# Patient Record
Sex: Female | Born: 1965 | Race: White | Hispanic: No | Marital: Married | Smoking: Never smoker
Health system: Southern US, Community
[De-identification: ages and names within clinical notes are randomized; demographics above are authoritative.]

## PROBLEM LIST (undated history)

## (undated) DIAGNOSIS — E079 Disorder of thyroid, unspecified: Secondary | ICD-10-CM

## (undated) DIAGNOSIS — R5382 Chronic fatigue, unspecified: Secondary | ICD-10-CM

## (undated) DIAGNOSIS — M1990 Unspecified osteoarthritis, unspecified site: Secondary | ICD-10-CM

## (undated) DIAGNOSIS — R51 Headache: Secondary | ICD-10-CM

## (undated) DIAGNOSIS — F329 Major depressive disorder, single episode, unspecified: Secondary | ICD-10-CM

## (undated) DIAGNOSIS — M797 Fibromyalgia: Secondary | ICD-10-CM

## (undated) DIAGNOSIS — F419 Anxiety disorder, unspecified: Secondary | ICD-10-CM

## (undated) DIAGNOSIS — G8929 Other chronic pain: Secondary | ICD-10-CM

## (undated) DIAGNOSIS — F32A Depression, unspecified: Secondary | ICD-10-CM

## (undated) DIAGNOSIS — R519 Headache, unspecified: Secondary | ICD-10-CM

## (undated) DIAGNOSIS — F319 Bipolar disorder, unspecified: Secondary | ICD-10-CM

## (undated) DIAGNOSIS — I10 Essential (primary) hypertension: Secondary | ICD-10-CM

## (undated) HISTORY — PX: BACK SURGERY: SHX140

## (undated) HISTORY — PX: KNEE SURGERY: SHX244

## (undated) HISTORY — DX: Headache: R51

## (undated) HISTORY — DX: Headache, unspecified: R51.9

## (undated) HISTORY — DX: Essential (primary) hypertension: I10

## (undated) HISTORY — PX: ANTERIOR CERVICAL DECOMP/DISCECTOMY FUSION: SHX1161

---

## 2016-08-30 ENCOUNTER — Emergency Department (HOSPITAL_COMMUNITY)
Admission: EM | Admit: 2016-08-30 | Discharge: 2016-08-30 | Disposition: A | Discharge status: Home / Self Care | Payer: Medicare Other | Attending: Emergency Medicine | Admitting: Emergency Medicine

## 2016-08-30 ENCOUNTER — Emergency Department (HOSPITAL_COMMUNITY): Payer: Medicare Other

## 2016-08-30 ENCOUNTER — Encounter (HOSPITAL_COMMUNITY): Payer: Self-pay

## 2016-08-30 DIAGNOSIS — E039 Hypothyroidism, unspecified: Secondary | ICD-10-CM | POA: Insufficient documentation

## 2016-08-30 DIAGNOSIS — Y939 Activity, unspecified: Secondary | ICD-10-CM | POA: Diagnosis not present

## 2016-08-30 DIAGNOSIS — Y999 Unspecified external cause status: Secondary | ICD-10-CM | POA: Insufficient documentation

## 2016-08-30 DIAGNOSIS — S199XXA Unspecified injury of neck, initial encounter: Secondary | ICD-10-CM | POA: Diagnosis present

## 2016-08-30 DIAGNOSIS — Y92410 Unspecified street and highway as the place of occurrence of the external cause: Secondary | ICD-10-CM | POA: Insufficient documentation

## 2016-08-30 DIAGNOSIS — S161XXA Strain of muscle, fascia and tendon at neck level, initial encounter: Secondary | ICD-10-CM | POA: Diagnosis not present

## 2016-08-30 HISTORY — DX: Disorder of thyroid, unspecified: E07.9

## 2016-08-30 HISTORY — DX: Unspecified osteoarthritis, unspecified site: M19.90

## 2016-08-30 HISTORY — DX: Bipolar disorder, unspecified: F31.9

## 2016-08-30 HISTORY — DX: Depression, unspecified: F32.A

## 2016-08-30 HISTORY — DX: Major depressive disorder, single episode, unspecified: F32.9

## 2016-08-30 HISTORY — DX: Anxiety disorder, unspecified: F41.9

## 2016-08-30 NOTE — ED Notes (Signed)
Patient transported to CT 
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
## 2016-08-30 NOTE — Discharge Instructions (Signed)
Ibuprofen 600 mg every 6 hours as needed for pain.  Apply heating pad as needed for comfort.  Follow-up with your surgeon if you're not improving in the next 3-4 days.
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
## 2016-09-10 ENCOUNTER — Ambulatory Visit (INDEPENDENT_AMBULATORY_CARE_PROVIDER_SITE_OTHER): Payer: Medicare Other | Admitting: Family Medicine

## 2016-09-10 ENCOUNTER — Encounter: Payer: Self-pay | Admitting: Family Medicine

## 2016-09-10 VITALS — BP 113/79 | HR 84 | Temp 98.6°F | Ht 66.0 in | Wt 172.0 lb

## 2016-09-10 DIAGNOSIS — L600 Ingrowing nail: Secondary | ICD-10-CM

## 2016-09-10 DIAGNOSIS — F32A Depression, unspecified: Secondary | ICD-10-CM | POA: Insufficient documentation

## 2016-09-10 DIAGNOSIS — E039 Hypothyroidism, unspecified: Secondary | ICD-10-CM | POA: Insufficient documentation

## 2016-09-10 DIAGNOSIS — F418 Other specified anxiety disorders: Secondary | ICD-10-CM

## 2016-09-10 DIAGNOSIS — F419 Anxiety disorder, unspecified: Secondary | ICD-10-CM

## 2016-09-10 DIAGNOSIS — M47812 Spondylosis without myelopathy or radiculopathy, cervical region: Secondary | ICD-10-CM | POA: Insufficient documentation

## 2016-09-10 DIAGNOSIS — F329 Major depressive disorder, single episode, unspecified: Secondary | ICD-10-CM | POA: Insufficient documentation

## 2016-09-10 MED ORDER — SULFAMETHOXAZOLE-TRIMETHOPRIM 800-160 MG PO TABS: 1.0000 | ORAL_TABLET | Freq: Two times a day (BID) | ORAL | 0 refills | Status: DC

## 2016-09-10 NOTE — Progress Notes (Signed)
BP 113/79   Pulse 84   Temp 98.6 F (37 C) (Oral)   Ht 5\' 6"  (1.676 m)   Wt 172 lb (78 kg)   LMP 08/28/2016 (Approximate) Comment: Pt reports being unknown when she had her last period, but had spotting two days ago.   BMI 27.76 kg/m    Subjective:    Patient ID: Wendy Collier, female    DOB: February 02, 1966, 51 y.o.   MRN: [id number]  HPI: Wendy Collier is a 51 y.o. female presenting on 09/10/2016 for Ingrown Toenail (right great toe, had bilateral toe nail removal in November)   HPI Right great toe pain and ingrown toenail Patient is having right great toe pain and an ingrown toenail on that right great toe that she has had recurrently and is starting to flare up again over the past week. She has had it removed surgically once before by dermatology but wants it not to grow back this time if possible. She's had it multiple times in the past in her life. She denies any fevers or chills. She does have some redness and warmth and some purulent drainage out of that side of the toe. I discussed the option of take it off today in the office but that we do not have the agent needed to keep it from growing back any further versus going to podiatry in having them do it and she opted to go to podiatry.  Anxiety and depression Patient is coming as a new patient to our office to establish care for anxiety and depression and is currently on medications for both anxiety and depression and ADHD. She is currently on Xanax and Adderall and Remeron and Lyrica and Trintellix. She is also on Ambien. She does not needing refills today but will come back and discuss options on her next visit. She denies any suicidal ideations or thoughts of hurting herself.  Relevant past medical, surgical, family and social history reviewed and updated as indicated. Interim medical history since our last visit reviewed. Allergies and medications reviewed and updated.  Review of Systems  Constitutional: Negative for chills and  fever.  Respiratory: Negative for chest tightness and shortness of breath.   Cardiovascular: Negative for chest pain and leg swelling.  Genitourinary: Negative for difficulty urinating and dysuria.  Musculoskeletal: Negative for back pain and gait problem.  Skin: Positive for color change and wound. Negative for rash.  Neurological: Negative for light-headedness and headaches.  Psychiatric/Behavioral: Negative for agitation and behavioral problems.  All other systems reviewed and are negative.   Per HPI unless specifically indicated above   Allergies as of 09/10/2016      Reactions   Tegaderm Ag Mesh [silver]       Medication List       Accurate as of 09/10/16  4:22 PM. Always use your most recent med list.          ALPRAZolam 0.5 MG tablet Commonly known as:  XANAX Take 0.5 mg by mouth 3 (three) times daily as needed for anxiety.   amphetamine-dextroamphetamine 10 MG tablet Commonly known as:  ADDERALL Take 10 mg by mouth 2 (two) times daily with a meal.   Biotin 1 MG Caps Take 1 mg by mouth daily.   celecoxib 200 MG capsule Commonly known as:  CELEBREX Take 200 mg by mouth daily.   cholecalciferol 1000 units tablet Commonly known as:  VITAMIN D Take 1,000 Units by mouth daily.   cyclobenzaprine 10 MG tablet Commonly known as:  FLEXERIL Take 10 mg by mouth 3 (three) times daily as needed for muscle spasms.   Fish Oil 1000 MG Caps Take 2,000 mg by mouth daily.   l-methylfolate-B6-B12 3-35-2 MG Tabs tablet Commonly known as:  METANX Take 1 tablet by mouth daily.   levothyroxine 50 MCG tablet Commonly known as:  SYNTHROID, LEVOTHROID Take 50 mcg by mouth daily before breakfast.   magnesium 30 MG tablet Take 30 mg by mouth 2 (two) times daily.   mirtazapine 45 MG tablet Commonly known as:  REMERON Take 45 mg by mouth at bedtime.   multivitamin with minerals tablet Take 1 tablet by mouth daily.   oxyCODONE-acetaminophen 5-325 MG tablet Commonly known  as:  PERCOCET/ROXICET Take 1 tablet by mouth every 6 (six) hours as needed for severe pain.   Pitavastatin Calcium 2 MG Tabs Take 2 mg by mouth at bedtime.   pregabalin 200 MG capsule Commonly known as:  LYRICA Take 200 mg by mouth daily.   selenium 50 MCG Tabs tablet Take 50 mcg by mouth daily.   sulfamethoxazole-trimethoprim 800-160 MG tablet Commonly known as:  BACTRIM DS,SEPTRA DS Take 1 tablet by mouth 2 (two) times daily.   TRINTELLIX 10 MG Tabs Generic drug:  vortioxetine HBr Take 10 mg by mouth daily.   vitamin C 100 MG tablet Take 100 mg by mouth daily.   zolpidem 10 MG tablet Commonly known as:  AMBIEN Take 10 mg by mouth at bedtime as needed for sleep.          Objective:    BP 113/79   Pulse 84   Temp 98.6 F (37 C) (Oral)   Ht 5\' 6"  (1.676 m)   Wt 172 lb (78 kg)   LMP 08/28/2016 (Approximate) Comment: Pt reports being unknown when she had her last period, but had spotting two days ago.   BMI 27.76 kg/m   Wt Readings from Last 3 Encounters:  09/10/16 172 lb (78 kg)  08/30/16 176 lb (79.8 kg)    Physical Exam  Constitutional: She is oriented to person, place, and time. She appears well-developed and well-nourished. No distress.  Eyes: Conjunctivae are normal.  Neck: Neck supple.  Cardiovascular: Normal rate, regular rhythm, normal heart sounds and intact distal pulses.   No murmur heard. Pulmonary/Chest: Effort normal and breath sounds normal. No respiratory distress. She has no wheezes.  Musculoskeletal: Normal range of motion. She exhibits no edema or tenderness.  Lymphadenopathy:    She has no cervical adenopathy.  Neurological: She is alert and oriented to person, place, and time. Coordination normal.  Skin: Skin is warm and dry. Lesion (Swelling and inflammation and erythema and purulent drainage coming out of the right great toe lateral paronychia.) noted. No rash noted. She is not diaphoretic.  Psychiatric: She has a normal mood and affect.  Her behavior is normal.  Nursing note and vitals reviewed.   No results found for this or any previous visit.    Assessment & Plan:   Problem List Items Addressed This Visit      Other   Anxiety and depression   Relevant Orders   Ambulatory referral to Psychology    Other Visit Diagnoses    Ingrown right big toenail    -  Primary   Patient has had multiple and wants it removed and not come back on the edge but we do not have the medication to do that, refer to podiatry   Relevant Medications   sulfamethoxazole-trimethoprim (BACTRIM DS,SEPTRA DS)  800-160 MG tablet   Other Relevant Orders   Ambulatory referral to Podiatry       Follow up plan: Return in about 4 weeks (around 10/08/2016), or if symptoms worsen or fail to improve, for Records request pending Depo-Provera and anxiety and depression.  Counseling provided for all of the vaccine components Orders Placed This Encounter  Procedures  . Ambulatory referral to Psychology  . Ambulatory referral to Podiatry    Arville Care, MD [facility] 09/10/2016, 4:22 PM

## 2016-09-19 ENCOUNTER — Telehealth (HOSPITAL_COMMUNITY): Payer: Self-pay | Admitting: *Deleted

## 2016-09-19 NOTE — Telephone Encounter (Signed)
phone call regarding appointment.   mailbox is full.

## 2016-10-08 ENCOUNTER — Ambulatory Visit: Payer: Medicare Other | Admitting: Family Medicine

## 2016-10-09 ENCOUNTER — Encounter: Payer: Self-pay | Admitting: Family Medicine

## 2016-10-10 ENCOUNTER — Ambulatory Visit (INDEPENDENT_AMBULATORY_CARE_PROVIDER_SITE_OTHER): Payer: Medicare Other | Admitting: Family Medicine

## 2016-10-10 ENCOUNTER — Encounter: Payer: Self-pay | Admitting: Family Medicine

## 2016-10-10 VITALS — BP 130/82 | HR 64 | Temp 98.0°F | Ht 66.0 in | Wt 169.0 lb

## 2016-10-10 DIAGNOSIS — F32A Depression, unspecified: Secondary | ICD-10-CM

## 2016-10-10 DIAGNOSIS — F329 Major depressive disorder, single episode, unspecified: Secondary | ICD-10-CM

## 2016-10-10 DIAGNOSIS — F419 Anxiety disorder, unspecified: Secondary | ICD-10-CM

## 2016-10-10 DIAGNOSIS — L600 Ingrowing nail: Secondary | ICD-10-CM | POA: Diagnosis not present

## 2016-10-10 MED ORDER — SULFAMETHOXAZOLE-TRIMETHOPRIM 800-160 MG PO TABS: 1.0000 | ORAL_TABLET | Freq: Two times a day (BID) | ORAL | 0 refills | Status: DC

## 2016-10-10 NOTE — Progress Notes (Signed)
BP 130/82   Pulse 64   Temp 98 F (36.7 C) (Oral)   Ht  (1.676 m)   Wt 169 lb (76.7 kg)   BMI 27.28 kg/m    Subjective:    Patient ID: Wendy Collier, female    DOB: [birth year], 51 y.o.   MRN: [id number]  HPI: Wendy Collier is a 51 y.o. female presenting on 10/10/2016 for Followup ingrown toenail   Patient presents for follow up for ingrown toenail. Patient states that her great toe on her right foot is feeling a lot better since last visit but admits to occasional throbbing pain in the toe. Throbbing is aggravated by prolonged standing and walking. Patient states that pain is mild and that the Bactrim has greatly reduced symptoms. Patient request to continue Bactrim because she does not want it to become infected, she states that she is worried because of the throbbing in the toe and that her toe fizzes when she puts hydrogen peroxide on it.  The patient's pain is on the lateral paronychia of the right great toe. She has a partial toenail removal with chemical removal as well to keep it from growing back.  Anxiety and Depression: Patient requests referral to psychiatry for her anxiety and depression. Depression screen [facility] 2/9 10/10/2016 09/10/2016  Decreased Interest 2 2  Down, Depressed, Hopeless 2 2  PHQ - 2 Score 4 4  Altered sleeping 3 3  Tired, decreased energy 3 3  Change in appetite 3 3  Feeling bad or failure about yourself  2 1  Trouble concentrating 2 2  Moving slowly or fidgety/restless 1 1  Suicidal thoughts 1 1  PHQ-9 Score 19 18  Difficult doing work/chores Somewhat difficult Very difficult  Patient has no suicidal or homicidal ideations today.  Relevant past medical, surgical, family and social history reviewed and updated as indicated. Interim medical history since our last visit reviewed. Allergies and medications reviewed and updated. Patient has had recent surgery on her cervical spine.  Review of Systems  Constitutional: Negative.  Negative for chills  and fever.  HENT: Negative.   Eyes: Negative.   Respiratory: Negative.  Negative for shortness of breath.   Cardiovascular: Negative.  Negative for chest pain.  Gastrointestinal: Negative.  Negative for abdominal pain, constipation, diarrhea, nausea and vomiting.  Endocrine: Negative.   Genitourinary: Negative.   Musculoskeletal: Positive for neck pain.       Right toe pain  Skin: Negative.   Allergic/Immunologic: Negative.   Neurological: Negative.     Per HPI unless specifically indicated above      Objective:    BP 130/82   Pulse 64   Temp 98 F (36.7 C) (Oral)   Ht  (1.676 m)   Wt 169 lb (76.7 kg)   BMI 27.28 kg/m   Wt Readings from Last 3 Encounters:  10/10/16 169 lb (76.7 kg)  09/10/16 172 lb (78 kg)  08/30/16 176 lb (79.8 kg)    Physical Exam  Constitutional: She is oriented to person, place, and time. She appears well-developed and well-nourished. No distress.  HENT:  Head: Normocephalic and atraumatic.  Eyes: Conjunctivae and EOM are normal. Pupils are equal, round, and reactive to light.  Neck: Normal range of motion.  Surgical scar present on right anterior surface  Cardiovascular: Normal rate, regular rhythm, normal heart sounds and intact distal pulses.  Exam reveals no gallop and no friction rub.   No murmur heard. Pulmonary/Chest: Effort normal and breath sounds normal.  No respiratory distress. She has no wheezes.  Musculoskeletal: Normal range of motion. She exhibits no edema.  Neurological: She is alert and oriented to person, place, and time.  Skin: Skin is warm and dry.     Psychiatric: She has a normal mood and affect. Her behavior is normal.    No results found for this or any previous visit.    Assessment & Plan:   Problem List Items Addressed This Visit      Other   Anxiety and depression   Relevant Orders   Ambulatory referral to Psychiatry    Other Visit Diagnoses    Ingrown right big toenail    -  Primary   Relevant  Medications   sulfamethoxazole-trimethoprim (BACTRIM DS,SEPTRA DS) 800-160 MG tablet      Patient was seen with Harlene Salts, student PA,  Agree with above A&P. Arville Care, MD [facility] 10/10/2016, 4:41 PM    Follow up plan: Return if symptoms worsen or fail to improve.  Counseling provided for all of the vaccine components No orders of the defined types were placed in this encounter.

## 2016-10-17 ENCOUNTER — Telehealth (HOSPITAL_COMMUNITY): Payer: Self-pay | Admitting: *Deleted

## 2016-10-17 NOTE — Telephone Encounter (Signed)
left voice message regarding an appointment. 

## 2016-11-30 ENCOUNTER — Telehealth (HOSPITAL_COMMUNITY): Payer: Self-pay | Admitting: *Deleted

## 2016-11-30 NOTE — Telephone Encounter (Signed)
left voice message regarding an appointment. 

## 2017-02-28 DIAGNOSIS — M23232 Derangement of other medial meniscus due to old tear or injury, left knee: Secondary | ICD-10-CM | POA: Insufficient documentation

## 2017-04-03 DIAGNOSIS — Z9889 Other specified postprocedural states: Secondary | ICD-10-CM | POA: Insufficient documentation

## 2017-08-13 DIAGNOSIS — M5020 Other cervical disc displacement, unspecified cervical region: Secondary | ICD-10-CM | POA: Insufficient documentation

## 2017-08-13 DIAGNOSIS — F332 Major depressive disorder, recurrent severe without psychotic features: Secondary | ICD-10-CM | POA: Insufficient documentation

## 2017-08-13 DIAGNOSIS — M797 Fibromyalgia: Secondary | ICD-10-CM | POA: Insufficient documentation

## 2017-08-13 DIAGNOSIS — F3132 Bipolar disorder, current episode depressed, moderate: Secondary | ICD-10-CM | POA: Insufficient documentation

## 2017-12-17 DIAGNOSIS — M5412 Radiculopathy, cervical region: Secondary | ICD-10-CM | POA: Insufficient documentation

## 2018-04-30 ENCOUNTER — Other Ambulatory Visit: Payer: Self-pay

## 2018-04-30 ENCOUNTER — Encounter (HOSPITAL_COMMUNITY): Payer: Self-pay | Admitting: Emergency Medicine

## 2018-04-30 ENCOUNTER — Emergency Department (HOSPITAL_COMMUNITY)
Admission: EM | Admit: 2018-04-30 | Discharge: 2018-04-30 | Disposition: A | Discharge status: Home / Self Care | Payer: Medicare Other | Attending: Emergency Medicine | Admitting: Emergency Medicine

## 2018-04-30 DIAGNOSIS — M79661 Pain in right lower leg: Secondary | ICD-10-CM | POA: Diagnosis present

## 2018-04-30 DIAGNOSIS — M79671 Pain in right foot: Secondary | ICD-10-CM | POA: Diagnosis not present

## 2018-04-30 DIAGNOSIS — G8929 Other chronic pain: Secondary | ICD-10-CM

## 2018-04-30 DIAGNOSIS — M25561 Pain in right knee: Secondary | ICD-10-CM | POA: Diagnosis not present

## 2018-04-30 DIAGNOSIS — Z79899 Other long term (current) drug therapy: Secondary | ICD-10-CM | POA: Insufficient documentation

## 2018-04-30 DIAGNOSIS — E039 Hypothyroidism, unspecified: Secondary | ICD-10-CM | POA: Diagnosis not present

## 2018-04-30 HISTORY — DX: Fibromyalgia: M79.7

## 2018-04-30 MED ORDER — HYDROCODONE-ACETAMINOPHEN 5-325 MG PO TABS: 2.0000 | ORAL_TABLET | ORAL | 0 refills | Status: DC | PRN

## 2018-04-30 MED ORDER — METHYLPREDNISOLONE SODIUM SUCC 125 MG IJ SOLR
125.0000 mg | Freq: Once | INTRAMUSCULAR | Status: AC
  Administered 2018-04-30: 125 mg via INTRAMUSCULAR
  Filled 2018-04-30: qty 2

## 2018-04-30 MED ORDER — KETOROLAC TROMETHAMINE 60 MG/2ML IM SOLN
60.0000 mg | Freq: Once | INTRAMUSCULAR | Status: AC
  Administered 2018-04-30: 60 mg via INTRAMUSCULAR
  Filled 2018-04-30: qty 2
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
## 2018-04-30 NOTE — Discharge Instructions (Addendum)
Schedule to see the Orthopaedist.  Call your primary MD tomorrow to request MRi of your right foot and discuss possible need for referral to pain mangement.

## 2018-04-30 NOTE — ED Provider Notes (Signed)
[facility] EMERGENCY DEPARTMENT Provider Note   CSN: [id number] Arrival date & time: 04/30/18  1647     History   Chief Complaint Chief Complaint  Patient presents with  . Leg Pain    HPI Wendy Collier is a 52 y.o. female.  The history is provided by the patient. No language interpreter was used.  Leg Pain   This is a new problem. The current episode started more than 1 week ago. The problem occurs constantly. The problem has been gradually worsening. The pain is present in the right foot. The quality of the pain is described as aching. Pertinent negatives include no numbness. She has tried nothing for the symptoms. There has been no history of extremity trauma.   Pt reports she had an MRi of her knee.  Pt reports she had surgery on her left knee by Dr. Case.  Pt reports she was having left knee problems.  Pt reports she sent records to Dr. Thomasena Edis who reviewed and said he would not see her.  Pt states she has since injured right knee and had an mri.  (reports shows meniscus tearing and degenerative changes) Pt reports pain shots from her knee up to her right buttock and down to her right foot. Pt is requesting an mri of her R foot.    Pt denies back pain, She reports nerve pain shooting up to her buttock, no back pain.   Pt reports she is in severe chronic pain and feels no one is helping her.   Pt reports occasional urine dribbling.  No loss of bowel control.  Pt reports she stays in bed a lot.   Pt is seeing a Veterinary surgeon. Pt denies thoughts of self harm.  Past Medical History:  Diagnosis Date  . Anxiety   . Arthritis   . Bipolar 1 disorder (HCC)   . Depression   . Fibromyalgia   . Frequent headaches   . Thyroid disease    Hypothyroidism     Patient Active Problem List   Diagnosis Date Noted  . Hypothyroidism 09/10/2016  . 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 Anxiety and depression 09/10/2016    Past Surgical History:  Procedure Laterality Date  . ANTERIOR CERVICAL  DECOMP/DISCECTOMY FUSION    . BACK SURGERY    . KNEE SURGERY       OB History   None      Home Medications    Prior to Admission medications   Medication Sig Start Date End Date Taking? Authorizing Provider  ALPRAZolam Prudy Feeler) 0.5 MG tablet Take 0.5 mg by mouth 3 (three) times daily as needed for anxiety.    [provider]  amphetamine-dextroamphetamine (ADDERALL) 10 MG tablet Take 10 mg by mouth 2 (two) times daily with a meal.    [provider]  Ascorbic Acid (VITAMIN C) 100 MG tablet Take 100 mg by mouth daily.    [provider]  Biotin 1 MG CAPS Take 1 mg by mouth daily.    [provider]  celecoxib (CELEBREX) 200 MG capsule Take 200 mg by mouth daily.    [provider]  cholecalciferol (VITAMIN D) 1000 units tablet Take 1,000 Units by mouth daily.    [provider]  cyclobenzaprine (FLEXERIL) 10 MG tablet Take 10 mg by mouth 3 (three) times daily as needed for muscle spasms.    [provider]  HYDROcodone-acetaminophen (NORCO/VICODIN) 5-325 MG tablet Take 2 tablets by mouth every 4 (four) hours as needed. 04/30/18  Cheron Schaumann K, PA-C  l-methylfolate-B6-B12 (METANX) 3-35-2 MG TABS tablet Take 1 tablet by mouth daily.    [provider]  levothyroxine (SYNTHROID, LEVOTHROID) 50 MCG tablet Take 50 mcg by mouth daily before breakfast.    [provider]  magnesium 30 MG tablet Take 30 mg by mouth 2 (two) times daily.    [provider]  mirtazapine (REMERON) 45 MG tablet Take 45 mg by mouth at bedtime.    [provider]  Multiple Vitamins-Minerals (MULTIVITAMIN WITH MINERALS) tablet Take 1 tablet by mouth daily.    [provider]  Omega-3 Fatty Acids (FISH OIL) 1000 MG CAPS Take 2,000 mg by mouth daily.    [provider]  oxyCODONE-acetaminophen (PERCOCET/ROXICET) 5-325 MG tablet Take 1 tablet by mouth every 6 (six) hours as needed for severe pain.     [provider]  Pitavastatin Calcium 2 MG TABS Take 2 mg by mouth at bedtime.    [provider]  pregabalin (LYRICA) 200 MG capsule Take 200 mg by mouth daily.    [provider]  selenium 50 MCG TABS tablet Take 50 mcg by mouth daily.    [provider]  sulfamethoxazole-trimethoprim (BACTRIM DS,SEPTRA DS) 800-160 MG tablet Take 1 tablet by mouth 2 (two) times daily. 10/10/16   Dettinger, Elige Radon, MD  vortioxetine HBr (TRINTELLIX) 10 MG TABS Take 10 mg by mouth daily.    [provider]  zolpidem (AMBIEN) 10 MG tablet Take 10 mg by mouth at bedtime as needed for sleep.    [provider]    Family History Family History  Problem Relation Age of Onset  . Alcohol abuse Mother   . COPD Mother   . Depression Mother   . Drug abuse Mother   . Early death Mother   . Mental illness Mother   . Alcohol abuse Father   . Cancer Father        kidney  . Mental illness Father   . Mental illness Brother        schizophrenia    Social History Social History   Tobacco Use  . Smoking status: Never Smoker  . Smokeless tobacco: Never Used  Substance Use Topics  . Alcohol use: No  . Drug use: No     Allergies   Tegaderm ag mesh [silver]   Review of Systems Review of Systems  Neurological: Negative for numbness.  All other systems reviewed and are negative.    Physical Exam Updated Vital Signs BP (!) 108/94 (BP Location: Right Arm)   Pulse (!) 129   Temp 98.3 F (36.8 C) (Oral)   Resp 16   Ht 5\' 6"  (1.676 m)   Wt 79.8 kg   SpO2 97%   BMI 28.41 kg/m   Physical Exam  Constitutional: She appears well-developed and well-nourished.  HENT:  Head: Normocephalic.  Cardiovascular: Normal rate.  Pulmonary/Chest: Effort normal.  Musculoskeletal: Normal range of motion.  Right foot no joint swelling, normal range of motion, nv intact.  Right knee from,  Pt bends knee to full flexion to put sock on.   Neurological: She is  alert.  Skin: Skin is warm.  Psychiatric: She has a normal mood and affect.  Nursing note and vitals reviewed.    ED Treatments / Results  Labs (all labs ordered are listed, but only abnormal results are displayed) Labs Reviewed - No data to display  EKG None  Radiology No results found.  Procedures Procedures (including  critical care time)  Medications Ordered in ED Medications  ketorolac (TORADOL) injection 60 mg (60 mg Intramuscular Given 04/30/18 1752)  methylPREDNISolone sodium succinate (SOLU-MEDROL) 125 mg/2 mL injection 125 mg (125 mg Intramuscular Given 04/30/18 1752)     Initial Impression / Assessment and Plan / ED Course  I have reviewed the triage vital signs and the nursing notes.  Pertinent labs & imaging results that were available during my care of the patient were reviewed by me and considered in my medical decision making (see chart for details).     Pt advised I can give her a referral to an Orthopaedist.  I will give her an injection of torodol and solumedrol.  Pt is advised I can not schedule MRI for her.  She is advised to call her primary MD tomorrow to discuss.  I will give her 10 tablets of hydrocodone.  Pt is advised her MD should provide her with pain medications or refer to pain management.   Final Clinical Impressions(s) / ED Diagnoses   Final diagnoses:  Pain of right foot  Chronic pain of right knee    ED Discharge Orders         Ordered    HYDROcodone-acetaminophen (NORCO/VICODIN) 5-325 MG tablet  Every 4 hours PRN     04/30/18 1744        An After Visit Summary was printed and given to the patient.    Elson Areas, [place] 04/30/18 Fernande Bras, MD 04/30/18 2018

## 2018-06-02 DIAGNOSIS — M5431 Sciatica, right side: Secondary | ICD-10-CM | POA: Insufficient documentation

## 2018-06-02 DIAGNOSIS — S83231A Complex tear of medial meniscus, current injury, right knee, initial encounter: Secondary | ICD-10-CM | POA: Insufficient documentation

## 2018-06-02 DIAGNOSIS — E559 Vitamin D deficiency, unspecified: Secondary | ICD-10-CM | POA: Insufficient documentation

## 2018-07-07 DIAGNOSIS — Z9889 Other specified postprocedural states: Secondary | ICD-10-CM | POA: Insufficient documentation

## 2018-11-05 ENCOUNTER — Other Ambulatory Visit: Payer: Self-pay | Admitting: *Deleted

## 2018-11-14 ENCOUNTER — Emergency Department (HOSPITAL_COMMUNITY): Payer: Medicare Other

## 2018-11-14 ENCOUNTER — Encounter (HOSPITAL_COMMUNITY): Payer: Self-pay

## 2018-11-14 ENCOUNTER — Other Ambulatory Visit: Payer: Self-pay

## 2018-11-14 ENCOUNTER — Inpatient Hospital Stay (HOSPITAL_COMMUNITY)
Admission: EM | Admit: 2018-11-14 | Discharge: 2018-11-17 | DRG: 064 | Disposition: A | Discharge status: Rehabilitation Facility | Payer: Medicare Other | Attending: Neurology | Admitting: Neurology

## 2018-11-14 DIAGNOSIS — I611 Nontraumatic intracerebral hemorrhage in hemisphere, cortical: Secondary | ICD-10-CM | POA: Diagnosis not present

## 2018-11-14 DIAGNOSIS — Z79899 Other long term (current) drug therapy: Secondary | ICD-10-CM

## 2018-11-14 DIAGNOSIS — M549 Dorsalgia, unspecified: Secondary | ICD-10-CM | POA: Diagnosis not present

## 2018-11-14 DIAGNOSIS — Z981 Arthrodesis status: Secondary | ICD-10-CM

## 2018-11-14 DIAGNOSIS — R29705 NIHSS score 5: Secondary | ICD-10-CM | POA: Diagnosis not present

## 2018-11-14 DIAGNOSIS — G936 Cerebral edema: Secondary | ICD-10-CM | POA: Diagnosis not present

## 2018-11-14 DIAGNOSIS — Z1159 Encounter for screening for other viral diseases: Secondary | ICD-10-CM

## 2018-11-14 DIAGNOSIS — F99 Mental disorder, not otherwise specified: Secondary | ICD-10-CM | POA: Diagnosis not present

## 2018-11-14 DIAGNOSIS — K588 Other irritable bowel syndrome: Secondary | ICD-10-CM

## 2018-11-14 DIAGNOSIS — F909 Attention-deficit hyperactivity disorder, unspecified type: Secondary | ICD-10-CM | POA: Diagnosis not present

## 2018-11-14 DIAGNOSIS — Z79890 Hormone replacement therapy (postmenopausal): Secondary | ICD-10-CM

## 2018-11-14 DIAGNOSIS — F319 Bipolar disorder, unspecified: Secondary | ICD-10-CM | POA: Diagnosis not present

## 2018-11-14 DIAGNOSIS — M542 Cervicalgia: Secondary | ICD-10-CM | POA: Diagnosis not present

## 2018-11-14 DIAGNOSIS — I10 Essential (primary) hypertension: Secondary | ICD-10-CM | POA: Diagnosis not present

## 2018-11-14 DIAGNOSIS — E039 Hypothyroidism, unspecified: Secondary | ICD-10-CM | POA: Diagnosis not present

## 2018-11-14 DIAGNOSIS — F419 Anxiety disorder, unspecified: Secondary | ICD-10-CM | POA: Diagnosis not present

## 2018-11-14 DIAGNOSIS — F329 Major depressive disorder, single episode, unspecified: Secondary | ICD-10-CM | POA: Diagnosis present

## 2018-11-14 DIAGNOSIS — G8929 Other chronic pain: Secondary | ICD-10-CM | POA: Diagnosis not present

## 2018-11-14 DIAGNOSIS — M797 Fibromyalgia: Secondary | ICD-10-CM | POA: Diagnosis present

## 2018-11-14 DIAGNOSIS — I619 Nontraumatic intracerebral hemorrhage, unspecified: Secondary | ICD-10-CM | POA: Diagnosis present

## 2018-11-14 DIAGNOSIS — E038 Other specified hypothyroidism: Secondary | ICD-10-CM

## 2018-11-14 HISTORY — DX: Other chronic pain: G89.29

## 2018-11-14 HISTORY — DX: Anxiety disorder, unspecified: F41.9

## 2018-11-14 HISTORY — DX: Chronic fatigue, unspecified: R53.82

## 2018-11-14 LAB — COMPREHENSIVE METABOLIC PANEL
ALT: 20 U/L (ref 0–44)
AST: 26 U/L (ref 15–41)
Albumin: 4.5 g/dL (ref 3.5–5.0)
Alkaline Phosphatase: 108 U/L (ref 38–126)
Anion gap: 12 (ref 5–15)
BUN: 20 mg/dL (ref 6–20)
CO2: 24 mmol/L (ref 22–32)
Calcium: 9.7 mg/dL (ref 8.9–10.3)
Chloride: 105 mmol/L (ref 98–111)
Creatinine, Ser: 0.8 mg/dL (ref 0.44–1.00)
GFR calc Af Amer: >60 mL/min (ref >60)
GFR calc non Af Amer: >60 mL/min (ref >60)
Glucose, Bld: 101 mg/dL — ABNORMAL HIGH (ref 70–99)
Potassium: 4 mmol/L (ref 3.5–5.1)
Sodium: 141 mmol/L (ref 135–145)
Total Bilirubin: 0.6 mg/dL (ref 0.3–1.2)
Total Protein: 7.2 g/dL (ref 6.5–8.1)

## 2018-11-14 LAB — CBC WITH DIFFERENTIAL/PLATELET
Abs Immature Granulocytes: 0.04 10*3/uL (ref 0.00–0.07)
Basophils Absolute: 0.1 10*3/uL (ref 0.0–0.1)
Basophils Relative: 1 %
Eosinophils Absolute: 0.5 10*3/uL (ref 0.0–0.5)
Eosinophils Relative: 5 %
HCT: 41.1 % (ref 36.0–46.0)
Hemoglobin: 13.9 g/dL (ref 12.0–15.0)
Immature Granulocytes: 0 %
Lymphocytes Relative: 18 %
Lymphs Abs: 1.9 10*3/uL (ref 0.7–4.0)
MCH: 29.8 pg (ref 26.0–34.0)
MCHC: 33.8 g/dL (ref 30.0–36.0)
MCV: 88 fL (ref 80.0–100.0)
Monocytes Absolute: 0.8 10*3/uL (ref 0.1–1.0)
Monocytes Relative: 7 %
Neutro Abs: 7.2 10*3/uL (ref 1.7–7.7)
Neutrophils Relative %: 69 %
Platelets: 260 10*3/uL (ref 150–400)
RBC: 4.67 MIL/uL (ref 3.87–5.11)
RDW: 12.6 % (ref 11.5–15.5)
WBC: 10.5 10*3/uL (ref 4.0–10.5)
nRBC: 0 % (ref 0.0–0.2)

## 2018-11-14 LAB — URINALYSIS, ROUTINE W REFLEX MICROSCOPIC
Bilirubin Urine: NEGATIVE
Glucose, UA: NEGATIVE mg/dL
Hgb urine dipstick: NEGATIVE
Ketones, ur: NEGATIVE mg/dL
Leukocytes,Ua: NEGATIVE
Nitrite: NEGATIVE
Protein, ur: NEGATIVE mg/dL
Specific Gravity, Urine: 1.043 — ABNORMAL HIGH (ref 1.005–1.030)
pH: 6 (ref 5.0–8.0)

## 2018-11-14 LAB — AMMONIA: Ammonia: 16 umol/L (ref 9–35)

## 2018-11-14 LAB — MRSA PCR SCREENING: MRSA by PCR: NEGATIVE

## 2018-11-14 LAB — RAPID URINE DRUG SCREEN, HOSP PERFORMED
Amphetamines: POSITIVE — AB
Barbiturates: NOT DETECTED
Benzodiazepines: POSITIVE — AB
Cocaine: NOT DETECTED
Opiates: NOT DETECTED
Tetrahydrocannabinol: NOT DETECTED

## 2018-11-14 LAB — PROTIME-INR
INR: 1 (ref 0.8–1.2)
Prothrombin Time: 12.9 seconds (ref 11.4–15.2)

## 2018-11-14 LAB — SARS CORONAVIRUS 2 BY RT PCR (HOSPITAL ORDER, PERFORMED IN ~~LOC~~ HOSPITAL LAB): SARS Coronavirus 2: NEGATIVE

## 2018-11-14 MED ORDER — PANTOPRAZOLE SODIUM 40 MG IV SOLR
40.0000 mg | Freq: Every day | INTRAVENOUS | Status: DC
  Administered 2018-11-14 – 2018-11-16 (×3): 40 mg via INTRAVENOUS
  Filled 2018-11-14 (×3): qty 40

## 2018-11-14 MED ORDER — STROKE: EARLY STAGES OF RECOVERY BOOK
Freq: Once | Status: AC
  Administered 2018-11-14: 21:00:00
  Filled 2018-11-14: qty 1

## 2018-11-14 MED ORDER — ACETAMINOPHEN 325 MG PO TABS
650.0000 mg | ORAL_TABLET | ORAL | Status: DC | PRN
  Administered 2018-11-14 – 2018-11-16 (×8): 650 mg via ORAL
  Filled 2018-11-14 (×8): qty 2

## 2018-11-14 MED ORDER — ONDANSETRON HCL 4 MG PO TABS
4.0000 mg | ORAL_TABLET | Freq: Once | ORAL | Status: AC
  Administered 2018-11-14: 4 mg via ORAL
  Filled 2018-11-14: qty 1

## 2018-11-14 MED ORDER — ACETAMINOPHEN 160 MG/5ML PO SOLN: 650.0000 mg | ORAL | Status: DC | PRN

## 2018-11-14 MED ORDER — SENNOSIDES-DOCUSATE SODIUM 8.6-50 MG PO TABS
1.0000 | ORAL_TABLET | Freq: Two times a day (BID) | ORAL | Status: DC
  Administered 2018-11-14 – 2018-11-17 (×6): 1 via ORAL
  Filled 2018-11-14 (×6): qty 1

## 2018-11-14 MED ORDER — CHLORHEXIDINE GLUCONATE CLOTH 2 % EX PADS
6.0000 | MEDICATED_PAD | Freq: Every day | CUTANEOUS | Status: DC
  Administered 2018-11-14 – 2018-11-17 (×4): 6 via TOPICAL

## 2018-11-14 MED ORDER — ACETAMINOPHEN 650 MG RE SUPP: 650.0000 mg | RECTAL | Status: DC | PRN

## 2018-11-14 MED ORDER — CHLORHEXIDINE GLUCONATE CLOTH 2 % EX PADS: 6.0000 | MEDICATED_PAD | Freq: Every day | CUTANEOUS | Status: DC

## 2018-11-14 MED ORDER — IOHEXOL 350 MG/ML SOLN
75.0000 mL | Freq: Once | INTRAVENOUS | Status: AC | PRN
  Administered 2018-11-14: 75 mL via INTRAVENOUS

## 2018-11-14 MED ORDER — GADOBUTROL 1 MMOL/ML IV SOLN
7.5000 mL | Freq: Once | INTRAVENOUS | Status: AC | PRN
  Administered 2018-11-14: 7.5 mL via INTRAVENOUS

## 2018-11-14 MED ORDER — CLEVIDIPINE BUTYRATE 0.5 MG/ML IV EMUL
0.0000 mg/h | INTRAVENOUS | Status: DC
  Administered 2018-11-14: 21:00:00 1 mg/h via INTRAVENOUS
  Administered 2018-11-15 (×3): 2.5 mg/h via INTRAVENOUS
  Filled 2018-11-14 (×5): qty 50

## 2018-11-14 NOTE — ED Triage Notes (Addendum)
Last night at 2100, pt got very confused. States she put posts on facebook to remember this. Is also complaining of a headache that started around 2100 as well. Pt also states her right hand and right foot don't "feel like her own." She says it is more pronounced now, but has no idea what time it started. Also has some double vision.

## 2018-11-14 NOTE — ED Notes (Signed)
[facility] has been called for transport for this patient

## 2018-11-14 NOTE — H&P (Signed)
Chief Complaint: Headache, right leg weakness  History obtained from: Patient and Chart    HPI:                                                                                                                                       Wendy Collier is a 53 y.o. female with past medical history of migraines, fibromyalgia, psychiatric disorder presented to [facility] emergency department with 1 day history of headache, confusion followed by reduced vision on the right side and right leg weakness.  She states that her headache started yesterday and she thought this was due to her migraines.  She then noticed today that she had weakness in her right leg as well as loss of control over her right hand.  She also felt that she has not been seeing as well and decided to present to the emergency room.  Her blood pressure at [facility]  was 138 x 87 mmHg, however subsequently increased to about 150 systolic.  CT head was obtained which showed a moderate sized, approximately 30 cc hemorrhage in the left posterior medial parietal  as well as occipital lobe.  Neurology at [facility] was consulted and a CTA head as well as the MR brain and MR venogram was performed prior to transfer. No dural venous sinus thrombosis was noted on MR venogram.  CTA showed no evidence of aneurysm or AVM.  There was some subdural extension of the hematoma.  On arrival patient is awake and following commands.  Blood pressure slightly low 140 systolic and therefore Cardene drip was started.  The patient denies any history of drug abuse.  Does state that she drinks "Monsters" caffeinated drinks infrequently needs to having 1 yesterday.   Date last known well:  Time last known well:  tPA Given:  NIHSS: 5 Baseline MRS 0  Intracerebral Hemorrhage (ICH) Score  Glascow Coma Score 13-15 0  Age >/= no 0  ICH volume >/= 30ml  yes +1  IVH yes  0  Infratentorial origin  0 Total:  1   Past Medical History:  Diagnosis Date  .  Anxiety   . Arthritis   . Bipolar 1 disorder (HCC)   . Depression   . Fibromyalgia   . Frequent headaches   . Thyroid disease    Hypothyroidism     Past Surgical History:  Procedure Laterality Date  . ANTERIOR CERVICAL DECOMP/DISCECTOMY FUSION    . BACK SURGERY    . KNEE SURGERY      Family History  Problem Relation Age of Onset  . Alcohol abuse Mother   . COPD Mother   . Depression Mother   . Drug abuse Mother   . Early death Mother   . Mental illness Mother   . Alcohol abuse Father   . Cancer Father        kidney  . Mental illness Father   .  Mental illness Brother        schizophrenia   Social History:  reports that she has never smoked. She has never used smokeless tobacco. She reports that she does not drink alcohol or use drugs.  Allergies:  Allergies  Allergen Reactions  . Tegaderm Ag Mesh [Silver]     Medications:                                                                                                                        I reviewed home medications   ROS:                                                                                                                                     14 systems reviewed and negative except above    Examination:                                                                                                      General: Appears well-developed  Psych: Affect appropriate to situation Eyes: No scleral injection HENT: No OP obstrucion Head: Normocephalic.  Cardiovascular: Normal rate and regular rhythm.  Respiratory: Effort normal and breath sounds normal to anterior ascultation GI: Soft.  No distension. There is no tenderness.  Skin: WDI    Neurological Examination Mental Status: Alert, oriented, thought content appropriate.  Speech fluent without evidence of aphasia. Able to follow 3 step commands without difficulty. Cranial Nerves: II: Visual fields : Right  heminopsia III,IV, VI: ptosis not present,  extra-ocular motions intact bilaterally, pupils equal, round, reactive to light and accommodation V,VII: smile symmetric, facial light touch sensation normal bilaterally VIII: hearing normal bilaterally IX,X: uvula rises symmetrically XI: bilateral shoulder shrug XII: midline tongue extension Motor: Right : Upper extremity   5/5    Left:     Upper extremity   5/5  Lower extremity   5/5     Lower extremity   4/5 Tone and bulk:normal tone throughout; no atrophy noted Sensory:  Reduced sensation over the right arm and leg to light touch, temperature Deep Tendon Reflexes: 2+ and symmetric throughout Plantars: Right: downgoing   Left: downgoing Cerebellar: normal finger-to-nose Gait: not assessed     Lab Results: Basic Metabolic Panel: Recent Labs  Lab 11/14/18 1450  NA 141  K 4.0  CL 105  CO2 24  GLUCOSE 101*  BUN 20  CREATININE 0.80  CALCIUM 9.7    CBC: Recent Labs  Lab 11/14/18 1450  WBC 10.5  NEUTROABS 7.2  HGB 13.9  HCT 41.1  MCV 88.0  PLT 260    Coagulation Studies: Recent Labs    11/14/18 1553  LABPROT 12.9  INR 1.0    Imaging: Ct Angio Head W Or Wo Contrast  Result Date: 11/14/2018 CLINICAL DATA:  Severe headache EXAM: CT ANGIOGRAPHY HEAD TECHNIQUE: Multidetector CT imaging of the head was performed using the standard protocol during bolus administration of intravenous contrast. Multiplanar CT image reconstructions and MIPs were obtained to evaluate the vascular anatomy. CONTRAST:  75mL OMNIPAQUE IOHEXOL 350 MG/ML SOLN COMPARISON:  None. FINDINGS: CT HEAD FINDINGS Brain: There is intraparenchymal hemorrhage within the left parietal lobe that measures 4.8 x 3.3 x 4.0 cm (volume = 33 cm^3). There is moderate surrounding edema with mass effect on the left occipital lobe. There is a small amount of subdural extension along the posterior falx cerebri. No midline shift. Basal cisterns are patent. No hydrocephalus. Skull: The visualized skull base, calvarium and  extracranial soft tissues are normal. Sinuses/Orbits: No fluid levels or advanced mucosal thickening of the visualized paranasal sinuses. No mastoid or middle ear effusion. The orbits are normal. CTA HEAD FINDINGS POSTERIOR CIRCULATION: --Basilar artery: Normal. --Posterior cerebral arteries: Normal. Both originate from the basilar artery. --Superior cerebellar arteries: Normal. --Inferior cerebellar arteries: Normal anterior and posterior inferior cerebellar arteries. ANTERIOR CIRCULATION: --Intracranial internal carotid arteries: Normal. --Anterior cerebral arteries: Normal. Both A1 segments are present. Patent anterior communicating artery. --Middle cerebral arteries: Normal. --Posterior communicating arteries: Present bilaterally. VENOUS SINUSES: As permitted by contrast timing, patent. ANATOMIC VARIANTS: None DELAYED PHASE: No parenchymal contrast enhancement. Review of the MIP images confirms the above findings. IMPRESSION: 1. Lobar intraparenchymal hemorrhage within the left parietal lobe with volume of 33 mL. Moderate edema with mild mass effect on the left occipital lobe. 2. Small amount of subdural extension of hematoma along the posterior falx cerebri. 3. Normal CTA of the intracranial arteries. No aneurysm or vascular malformation. Critical Value/emergent results were called by telephone at the time of interpretation on 11/14/2018 at 3:41 pm to Dr. Meridee Score , who verbally acknowledged these results. Electronically Signed   By: Deatra Robinson M.D.   On: 11/14/2018 15:41   Mr Laqueta Jean And Wo Contrast  Result Date: 11/14/2018 CLINICAL DATA:  Confusion and headaches.  Intracranial hemorrhage. EXAM: MRI HEAD WITHOUT AND WITH CONTRAST MRV HEAD WITHOUT CONTRAST TECHNIQUE: Multiplanar, multiecho pulse sequences of the brain and surrounding structures were obtained without and with intravenous contrast. Angiographic images of the intracranial venous structures were obtained using MRV technique without  intravenous contrast. CONTRAST:  7.5 mL Gadavist COMPARISON:  Head CT 11/14/2018 FINDINGS: MRI BRAIN FINDINGS BRAIN: There is diffusion abnormality at the site of known intraparenchymal hemorrhage. No other diffusion abnormality. Mild edema surrounding the hemorrhage site. White matter signal otherwise normal. The cerebral and cerebellar volume are age-appropriate. No hydrocephalus. Susceptibility-sensitive sequences show no chronic microhemorrhage or superficial siderosis. No abnormal contrast enhancement. VASCULAR: The major intracranial arterial and venous sinus flow voids are  normal. SKULL AND UPPER CERVICAL SPINE: Calvarial bone marrow signal is normal. There is no skull base mass. Visualized upper cervical spine and soft tissues are normal. SINUSES/ORBITS: No fluid levels or advanced mucosal thickening. No mastoid or middle ear effusion. The orbits are normal. MRV BRAIN FINDINGS Superior sagittal sinus: Normal. Straight sinus: Normal. Inferior sagittal sinus, vein of Galen and internal cerebral veins: Normal. Transverse sinuses: Normal. Sigmoid sinuses: Normal. Visualized jugular veins: Normal. IMPRESSION: 1. Unchanged appearance of intraparenchymal hematoma within the left parietal lobe with mild associated mass effect. No abnormal contrast enhancement. 2. No dural venous sinus thrombosis. 3. The etiology of the hemorrhage remains unclear. Follow up MRI with and without contrast with susceptibility-weighted imaging might be helpful in 4-6 weeks. Electronically Signed   By: Deatra RobinsonKevin  Herman M.D.   On: 11/14/2018 17:10   Mr Mrv Head Wo Cm  Result Date: 11/14/2018 CLINICAL DATA:  Confusion and headaches.  Intracranial hemorrhage. EXAM: MRI HEAD WITHOUT AND WITH CONTRAST MRV HEAD WITHOUT CONTRAST TECHNIQUE: Multiplanar, multiecho pulse sequences of the brain and surrounding structures were obtained without and with intravenous contrast. Angiographic images of the intracranial venous structures were obtained  using MRV technique without intravenous contrast. CONTRAST:  7.5 mL Gadavist COMPARISON:  Head CT 11/14/2018 FINDINGS: MRI BRAIN FINDINGS BRAIN: There is diffusion abnormality at the site of known intraparenchymal hemorrhage. No other diffusion abnormality. Mild edema surrounding the hemorrhage site. White matter signal otherwise normal. The cerebral and cerebellar volume are age-appropriate. No hydrocephalus. Susceptibility-sensitive sequences show no chronic microhemorrhage or superficial siderosis. No abnormal contrast enhancement. VASCULAR: The major intracranial arterial and venous sinus flow voids are normal. SKULL AND UPPER CERVICAL SPINE: Calvarial bone marrow signal is normal. There is no skull base mass. Visualized upper cervical spine and soft tissues are normal. SINUSES/ORBITS: No fluid levels or advanced mucosal thickening. No mastoid or middle ear effusion. The orbits are normal. MRV BRAIN FINDINGS Superior sagittal sinus: Normal. Straight sinus: Normal. Inferior sagittal sinus, vein of Galen and internal cerebral veins: Normal. Transverse sinuses: Normal. Sigmoid sinuses: Normal. Visualized jugular veins: Normal. IMPRESSION: 1. Unchanged appearance of intraparenchymal hematoma within the left parietal lobe with mild associated mass effect. No abnormal contrast enhancement. 2. No dural venous sinus thrombosis. 3. The etiology of the hemorrhage remains unclear. Follow up MRI with and without contrast with susceptibility-weighted imaging might be helpful in 4-6 weeks. Electronically Signed   By: Deatra RobinsonKevin  Herman M.D.   On: 11/14/2018 17:10     ASSESSMENT AND PLAN  53 y.o. female with past medical history of migraines, fibromyalgia, psychiatric disorder presented to [facility] emergency department with headache, noted to have moderate posterior left parietal intraparenchymal hemorrhage with subdural extension. Admitted to [facility] neuro ICU for further management.   Left Posterior Parietal  hemorrhage  Etiology?  Elevated blood pressure in the setting of caffeinated drinks/stimulants, possible reversible cerebrovascular Vasoconstriction syndrome ( CT angiogram negative, however)   -Blood Pressure goal less than 140 mmHg systolic , started on cardene gtt  -CTA, MR venogram negative for any vascular lesions, venous thrombosis -Consider diagnostic angiogram and repeat MRI in 6 weeks -No antiplatelets/anticoagulation -Frequent neurochecks  Hypertension  - started on cardene gtt with goal BP of <140 systolic  Bipolar disorder/ADHD -Hold psychiatric medications - Hold Adderal  Migraines - hold all NSAIDs  CODE STATUS: Full code DVT prophylaxis SCDs    This patient is neurologically critically ill due to ICH.  She is at risk for significant risk of neurological worsening from cerebral  edema, increase in size of hemorrhage, death from brain herniation, heart failure, infection, respiratory failure and seizure. This patient's care requires constant monitoring of vital signs, hemodynamics, respiratory and cardiac monitoring, review of multiple databases, neurological assessment, discussion with family, other specialists and medical decision making of high complexity.  I spent 50  minutes of neurocritical time in the care of this patient.        [facility] Pager Number [phone]

## 2018-11-14 NOTE — ED Provider Notes (Signed)
[facility] EMERGENCY DEPARTMENT Provider Note   CSN: [id number] Arrival date & time: 11/14/18  1353    History   Chief Complaint Chief Complaint  Patient presents with   Weakness    HPI Wendy Collier is a 53 y.o. female.  She has a history of migraine headaches fibromyalgia and psychiatric disorder.  She is complaining of an "occipital migraine" that started last evening in the back of her head which radiates around to her temples with associated visual wavy lines.  She said she is felt confused at times.  Today she feels like she has no control over her right leg and a little bit less in her right hand.  She denies that they are numb.  It is caused her to be unsteady and fall.  She has had headaches before but never associated with the confusion or weakness.  It is unclear when the weakness started but she thinks this morning.  No fevers or chills no cough no chest pain no vomiting no diarrhea.  No recent medication changes no trauma other than the falls today.     The history is provided by the patient.  Weakness  Severity:  Unable to specify Onset quality:  Unable to specify Timing:  Constant Progression:  Unchanged Chronicity:  New Context: not change in medication and not recent infection   Relieved by:  None tried Worsened by:  Activity Ineffective treatments:  None tried Associated symptoms: ataxia, difficulty walking, falls, headaches, sensory-motor deficit, stroke symptoms and vision change   Associated symptoms: no abdominal pain, no aphasia, no chest pain, no cough, no diarrhea, no dysphagia, no dysuria, no numbness in extremities, no fever, no foul-smelling urine, no loss of consciousness, no nausea, no seizures, no shortness of breath and no vomiting     Past Medical History:  Diagnosis Date   Anxiety    Arthritis    Bipolar 1 disorder (HCC)    Depression    Fibromyalgia    Frequent headaches    Thyroid disease    Hypothyroidism     Patient Active  Problem List   Diagnosis Date Noted   Hypothyroidism 09/10/2016   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      Home Medications    Prior to Admission medications   Medication Sig Start Date End Date Taking? Authorizing Provider  ALPRAZolam Prudy Feeler) 0.5 MG tablet Take 0.5 mg by mouth 3 (three) times daily as needed for anxiety.    [provider]  amphetamine-dextroamphetamine (ADDERALL) 10 MG tablet Take 10 mg by mouth 2 (two) times daily with a meal.    [provider]  Ascorbic Acid (VITAMIN C) 100 MG tablet Take 100 mg by mouth daily.    [provider]  Biotin 1 MG CAPS Take 1 mg by mouth daily.    [provider]  celecoxib (CELEBREX) 200 MG capsule Take 200 mg by mouth daily.    [provider]  cholecalciferol (VITAMIN D) 1000 units tablet Take 1,000 Units by mouth daily.    [provider]  cyclobenzaprine (FLEXERIL) 10 MG tablet Take 10 mg by mouth 3 (three) times daily as needed for muscle spasms.    [provider]  l-methylfolate-B6-B12 Latrelle Dodrill)  3-35-2 MG TABS tablet Take 1 tablet by mouth daily.    [provider]  levothyroxine (SYNTHROID, LEVOTHROID) 50 MCG tablet Take 50 mcg by mouth daily before breakfast.    [provider]  magnesium 30 MG tablet Take 30 mg by mouth 2 (two) times daily.    [provider]  mirtazapine (REMERON) 45 MG tablet Take 45 mg by mouth at bedtime.    [provider]  Multiple Vitamins-Minerals (MULTIVITAMIN WITH MINERALS) tablet Take 1 tablet by mouth daily.    [provider]  Omega-3 Fatty Acids (FISH OIL) 1000 MG CAPS Take 2,000 mg by mouth daily.    [provider]  Pitavastatin Calcium 2 MG TABS Take 2 mg  by mouth at bedtime.    [provider]  pregabalin (LYRICA) 200 MG capsule Take 200 mg by mouth daily.    [provider]  selenium 50 MCG TABS tablet Take 50 mcg by mouth daily.    [provider]  sulfamethoxazole-trimethoprim (BACTRIM DS,SEPTRA DS) 800-160 MG tablet Take 1 tablet by mouth 2 (two) times daily. 10/10/16   Dettinger, Elige Radon, MD  vortioxetine HBr (TRINTELLIX) 10 MG TABS Take 10 mg by mouth daily.    [provider]  zolpidem (AMBIEN) 10 MG tablet Take 10 mg by mouth at bedtime as needed for sleep.    [provider]    Family History Family History  Problem Relation Age of Onset   Alcohol abuse Mother    COPD Mother    Depression Mother    Drug abuse Mother    Early death Mother    Mental illness Mother    Alcohol abuse Father    Cancer Father        kidney   Mental illness Father    Mental illness Brother        schizophrenia    Social History Social History   Tobacco Use   Smoking status: Never Smoker   Smokeless tobacco: Never Used  Substance Use Topics   Alcohol use: No   Drug use: No     Allergies   Tegaderm ag mesh [silver]   Review of Systems Review of Systems  Constitutional: Negative for fever.  HENT: Negative for sore throat.   Eyes: Positive for visual disturbance.  Respiratory: Negative for cough and shortness of breath.   Cardiovascular: Negative for chest pain.  Gastrointestinal: Negative for abdominal pain, diarrhea, dysphagia, nausea and vomiting.  Genitourinary: Negative for dysuria.  Musculoskeletal: Positive for falls and gait problem.  Skin: Negative for rash.  Neurological: Positive for weakness and headaches. Negative for seizures and loss of consciousness.  Psychiatric/Behavioral: Positive for confusion.     Physical Exam Updated Vital Signs BP 138/87 (BP Location: Left Arm)    Pulse 86    Temp 98 F (36.7 C) (Oral)    Resp 15    Ht  (1.676 m)    Wt 73.5  kg    SpO2 97%    BMI 26.15 kg/m   Physical Exam Vitals signs and nursing note reviewed.  Constitutional:      General: She is not in acute distress.    Appearance: She is well-developed.  HENT:     Head: Normocephalic and atraumatic.  Eyes:     Conjunctiva/sclera: Conjunctivae normal.  Neck:     Musculoskeletal: Neck supple.  Cardiovascular:     Rate and Rhythm: Normal rate and regular rhythm.     Heart sounds: No  murmur.  Pulmonary:     Effort: Pulmonary effort is normal. No respiratory distress.     Breath sounds: Normal breath sounds.  Abdominal:     Palpations: Abdomen is soft.     Tenderness: There is no abdominal tenderness.  Musculoskeletal: Normal range of motion.     Right lower leg: No edema.     Left lower leg: No edema.  Skin:    General: Skin is warm and dry.     Capillary Refill: Capillary refill takes less than 2 seconds.  Neurological:     Mental Status: She is alert and oriented to person, place, and time.     Cranial Nerves: No cranial nerve deficit.     Sensory: No sensory deficit.     Comments: Slight weakness right leg, but elevates 10 seconds.       ED Treatments / Results  Labs (all labs ordered are listed, but only abnormal results are displayed) Labs Reviewed  COMPREHENSIVE METABOLIC PANEL - Abnormal; Notable for the following components:      Result Value   Glucose, Bld 101 (*)    All other components within normal limits  URINALYSIS, ROUTINE W REFLEX MICROSCOPIC - Abnormal; Notable for the following components:   Specific Gravity, Urine 1.043 (*)    All other components within normal limits  RAPID URINE DRUG SCREEN, HOSP PERFORMED - Abnormal; Notable for the following components:   Benzodiazepines POSITIVE (*)    Amphetamines POSITIVE (*)    All other components within normal limits  SARS CORONAVIRUS 2 (HOSPITAL ORDER, PERFORMED IN [facility])  MRSA PCR SCREENING  CBC WITH DIFFERENTIAL/PLATELET  AMMONIA  PROTIME-INR    HIV ANTIBODY (ROUTINE TESTING W REFLEX)  CBG MONITORING, ED    EKG EKG Interpretation  Date/Time:  Friday Nov 14 2018 14:52:56 EDT Ventricular Rate:  80 PR Interval:    QRS Duration: 95 QT Interval:  402 QTC Calculation: 464 R Axis:   47 Text Interpretation:  Sinus rhythm Low voltage, precordial leads no prior to compare with Confirmed by Meridee ScoreButler, Richele Strand [phone]([id number]) on 11/14/2018 3:02:17 PM   Radiology Ct Angio Head W Or Wo Contrast  Result Date: 11/14/2018 CLINICAL DATA:  Severe headache EXAM: CT ANGIOGRAPHY HEAD TECHNIQUE: Multidetector CT imaging of the head was performed using the standard protocol during bolus administration of intravenous contrast. Multiplanar CT image reconstructions and MIPs were obtained to evaluate the vascular anatomy. CONTRAST:  75mL OMNIPAQUE IOHEXOL 350 MG/ML SOLN COMPARISON:  None. FINDINGS: CT HEAD FINDINGS Brain: There is intraparenchymal hemorrhage within the left parietal lobe that measures 4.8 x 3.3 x 4.0 cm (volume = 33 cm^3). There is moderate surrounding edema with mass effect on the left occipital lobe. There is a small amount of subdural extension along the posterior falx cerebri. No midline shift. Basal cisterns are patent. No hydrocephalus. Skull: The visualized skull base, calvarium and extracranial soft tissues are normal. Sinuses/Orbits: No fluid levels or advanced mucosal thickening of the visualized paranasal sinuses. No mastoid or middle ear effusion. The orbits are normal. CTA HEAD FINDINGS POSTERIOR CIRCULATION: --Basilar artery: Normal. --Posterior cerebral arteries: Normal. Both originate from the basilar artery. --Superior cerebellar arteries: Normal. --Inferior cerebellar arteries: Normal anterior and posterior inferior cerebellar arteries. ANTERIOR CIRCULATION: --Intracranial internal carotid arteries: Normal. --Anterior cerebral arteries: Normal. Both A1 segments are present. Patent anterior communicating artery. --Middle cerebral arteries:  Normal. --Posterior communicating arteries: Present bilaterally. VENOUS SINUSES: As permitted by contrast timing, patent. ANATOMIC VARIANTS: None DELAYED PHASE: No parenchymal  contrast enhancement. Review of the MIP images confirms the above findings. IMPRESSION: 1. Lobar intraparenchymal hemorrhage within the left parietal lobe with volume of 33 mL. Moderate edema with mild mass effect on the left occipital lobe. 2. Small amount of subdural extension of hematoma along the posterior falx cerebri. 3. Normal CTA of the intracranial arteries. No aneurysm or vascular malformation. Critical Value/emergent results were called by telephone at the time of interpretation on 11/14/2018 at 3:41 pm to Dr. Meridee Score , who verbally acknowledged these results. Electronically Signed   By: Deatra Robinson M.D.   On: 11/14/2018 15:41   Mr Laqueta Jean And Wo Contrast  Result Date: 11/14/2018 CLINICAL DATA:  Confusion and headaches.  Intracranial hemorrhage. EXAM: MRI HEAD WITHOUT AND WITH CONTRAST MRV HEAD WITHOUT CONTRAST TECHNIQUE: Multiplanar, multiecho pulse sequences of the brain and surrounding structures were obtained without and with intravenous contrast. Angiographic images of the intracranial venous structures were obtained using MRV technique without intravenous contrast. CONTRAST:  7.5 mL Gadavist COMPARISON:  Head CT 11/14/2018 FINDINGS: MRI BRAIN FINDINGS BRAIN: There is diffusion abnormality at the site of known intraparenchymal hemorrhage. No other diffusion abnormality. Mild edema surrounding the hemorrhage site. White matter signal otherwise normal. The cerebral and cerebellar volume are age-appropriate. No hydrocephalus. Susceptibility-sensitive sequences show no chronic microhemorrhage or superficial siderosis. No abnormal contrast enhancement. VASCULAR: The major intracranial arterial and venous sinus flow voids are normal. SKULL AND UPPER CERVICAL SPINE: Calvarial bone marrow signal is normal. There is no skull  base mass. Visualized upper cervical spine and soft tissues are normal. SINUSES/ORBITS: No fluid levels or advanced mucosal thickening. No mastoid or middle ear effusion. The orbits are normal. MRV BRAIN FINDINGS Superior sagittal sinus: Normal. Straight sinus: Normal. Inferior sagittal sinus, vein of Galen and internal cerebral veins: Normal. Transverse sinuses: Normal. Sigmoid sinuses: Normal. Visualized jugular veins: Normal. IMPRESSION: 1. Unchanged appearance of intraparenchymal hematoma within the left parietal lobe with mild associated mass effect. No abnormal contrast enhancement. 2. No dural venous sinus thrombosis. 3. The etiology of the hemorrhage remains unclear. Follow up MRI with and without contrast with susceptibility-weighted imaging might be helpful in 4-6 weeks. Electronically Signed   By: Deatra Robinson M.D.   On: 11/14/2018 17:10   Mr Mrv Head Wo Cm  Result Date: 11/14/2018 CLINICAL DATA:  Confusion and headaches.  Intracranial hemorrhage. EXAM: MRI HEAD WITHOUT AND WITH CONTRAST MRV HEAD WITHOUT CONTRAST TECHNIQUE: Multiplanar, multiecho pulse sequences of the brain and surrounding structures were obtained without and with intravenous contrast. Angiographic images of the intracranial venous structures were obtained using MRV technique without intravenous contrast. CONTRAST:  7.5 mL Gadavist COMPARISON:  Head CT 11/14/2018 FINDINGS: MRI BRAIN FINDINGS BRAIN: There is diffusion abnormality at the site of known intraparenchymal hemorrhage. No other diffusion abnormality. Mild edema surrounding the hemorrhage site. White matter signal otherwise normal. The cerebral and cerebellar volume are age-appropriate. No hydrocephalus. Susceptibility-sensitive sequences show no chronic microhemorrhage or superficial siderosis. No abnormal contrast enhancement. VASCULAR: The major intracranial arterial and venous sinus flow voids are normal. SKULL AND UPPER CERVICAL SPINE: Calvarial bone marrow signal is  normal. There is no skull base mass. Visualized upper cervical spine and soft tissues are normal. SINUSES/ORBITS: No fluid levels or advanced mucosal thickening. No mastoid or middle ear effusion. The orbits are normal. MRV BRAIN FINDINGS Superior sagittal sinus: Normal. Straight sinus: Normal. Inferior sagittal sinus, vein of Galen and internal cerebral veins: Normal. Transverse sinuses: Normal. Sigmoid sinuses: Normal. Visualized jugular veins: Normal. IMPRESSION:  1. Unchanged appearance of intraparenchymal hematoma within the left parietal lobe with mild associated mass effect. No abnormal contrast enhancement. 2. No dural venous sinus thrombosis. 3. The etiology of the hemorrhage remains unclear. Follow up MRI with and without contrast with susceptibility-weighted imaging might be helpful in 4-6 weeks. Electronically Signed   By: Deatra Robinson M.D.   On: 11/14/2018 17:10    Procedures .Critical Care Performed by: Terrilee Files, MD Authorized by: Terrilee Files, MD   Critical care provider statement:    Critical care time (minutes):  45   Critical care time was exclusive of:  Separately billable procedures and treating other patients   Critical care was necessary to treat or prevent imminent or life-threatening deterioration of the following conditions:  CNS failure or compromise   Critical care was time spent personally by me on the following activities:  Discussions with consultants, evaluation of patient's response to treatment, examination of patient, ordering and performing treatments and interventions, ordering and review of laboratory studies, ordering and review of radiographic studies, pulse oximetry, re-evaluation of patient's condition, obtaining history from patient or surrogate, review of old charts and development of treatment plan with patient or surrogate   I assumed direction of critical care for this patient from another provider in my specialty: no     (including critical  care time)  Medications Ordered in ED Medications - No data to display   Initial Impression / Assessment and Plan / ED Course  I have reviewed the triage vital signs and the nursing notes.  Pertinent labs & imaging results that were available during my care of the patient were reviewed by me and considered in my medical decision making (see chart for details).  Clinical Course as of Nov 13 1749  Fri Nov 14, 2018  7458 53 year old female with history of headaches fibromyalgia bipolar here with complaint of headache with visual symptoms that started yesterday and today with a feeling like her right leg and right hand are not her own.  It is unclear of the timing of the onset but seems to be outside of any kind of stroke TPA window.  Deficits are also very atypical.  Differential includes subarachnoid, occipital migraine, CVA, encephalopathy.    [MB]  1526 Patient CT positive for bleed.  Awaiting radiology interpretation.   [MB]  1545 Received a call from radiology that the patient has a left parietal intraparenchymal bleed with some mass-effect on the cerebellum.  He did not see any obvious aneurysms.  I placed a call into neurology at [facility].  I updated the patient on her results.   [MB]  1549 Discussed with Dr. Wilford Corner from [facility] neurology.  He asked if we could get an emergent MRI on her but ultimately she will need to be transferred to [facility] for admission.  If she has a mass that would change the admitting doctor so we can accomplish this here it would aid in disposition.   [MB]  1557 Received a call back from Dr. Wilford Corner who recommends also getting an MRV with contrast.  Updated patient   [MB]  1736 MRI is back and no evidence of tumor or venous thrombosis.  Dr. Jerrell Belfast is accepting the patient for an ICU bed on 4 [address]. at [facility].   [MB]    Clinical Course User Index [MB] Terrilee Files, MD   Paullette Mckain was evaluated in Emergency Department on 11/14/2018 for the symptoms described in the  history of present illness.  She was evaluated in the context of the global COVID-19 pandemic, which necessitated consideration that the patient might be at risk for infection with the SARS-CoV-2 virus that causes COVID-19. Institutional protocols and algorithms that pertain to the evaluation of patients at risk for COVID-19 are in a state of rapid change based on information released by regulatory bodies including the CDC and federal and state organizations. These policies and algorithms were followed during the patient's care in the ED.      Final Clinical Impressions(s) / ED Diagnoses   Final diagnoses:  ICH (intracerebral hemorrhage) [facility])    ED Discharge Orders    None       Terrilee Files, MD 11/14/18 2004

## 2018-11-15 MED ORDER — BREXPIPRAZOLE 0.25 MG HALF TABLET
2.0000 mg | ORAL_TABLET | Freq: Every day | ORAL | Status: DC
  Filled 2018-11-15: qty 8

## 2018-11-15 MED ORDER — DULOXETINE HCL 30 MG PO CPEP
70.0000 mg | ORAL_CAPSULE | Freq: Every day | ORAL | Status: DC
  Administered 2018-11-15 – 2018-11-17 (×3): 70 mg via ORAL
  Filled 2018-11-15 (×3): qty 2

## 2018-11-15 MED ORDER — BREXPIPRAZOLE 2 MG PO TABS
2.0000 mg | ORAL_TABLET | Freq: Every day | ORAL | Status: DC
  Administered 2018-11-16 – 2018-11-17 (×2): 2 mg via ORAL
  Filled 2018-11-15 (×4): qty 1

## 2018-11-15 MED ORDER — LEVOTHYROXINE SODIUM 50 MCG PO TABS
50.0000 ug | ORAL_TABLET | Freq: Every day | ORAL | Status: DC
  Administered 2018-11-15 – 2018-11-17 (×3): 50 ug via ORAL
  Filled 2018-11-15 (×3): qty 1

## 2018-11-15 MED ORDER — TIZANIDINE HCL 4 MG PO TABS
4.0000 mg | ORAL_TABLET | Freq: Every day | ORAL | Status: DC
  Administered 2018-11-15 – 2018-11-16 (×2): 4 mg via ORAL
  Filled 2018-11-15 (×3): qty 1

## 2018-11-15 NOTE — Progress Notes (Signed)
PT Cancellation Note  Patient Details Name: Wendy Collier MRN: [id number] DOB: 17-May-1966   Cancelled Treatment:    Reason Eval/Treat Not Completed: Active bedrest order   Laurina Bustle, PT, DPT Acute Rehabilitation Services Pager [phone] Office [phone]    Vanetta Mulders 11/15/2018, 9:33 AM

## 2018-11-15 NOTE — Progress Notes (Signed)
STROKE TEAM PROGRESS NOTE   HISTORY OF PRESENT ILLNESS (per record) Wendy Collier is a 53 y.o. female with past medical history of migraines, fibromyalgia, psychiatric disorder presented to [facility] emergency department with 1 day history of headache, confusion followed by reduced vision on the right side and right leg weakness.  She states that her headache started yesterday and she thought this was due to her migraines.  She then noticed today that she had weakness in her right leg as well as loss of control over her right hand.  She also felt that she has not been seeing as well and decided to present to the emergency room.  Her blood pressure at [facility]  was 138 x 87 mmHg, however subsequently increased to about 150 systolic.  CT head was obtained which showed a moderate sized, approximately 30 cc hemorrhage in the left posterior medial parietal  as well as occipital lobe.  Neurology at [facility] was consulted and a CTA head as well as the MR brain and MR venogram was performed prior to transfer. No dural venous sinus thrombosis was noted on MR venogram.  CTA showed no evidence of aneurysm or AVM.  There was some subdural extension of the hematoma.  On arrival patient is awake and following commands.  Blood pressure slightly low 140 systolic and therefore Cardene drip was started.  The patient denies any history of drug abuse. Does state that she drinks "Monsters" caffeinated drinks infrequently needs to having 1 yesterday.   Date last known well:  Time last known well:  tPA Given:  NIHSS: 5 Baseline MRS 0   SUBJECTIVE (INTERVAL HISTORY) Her nurse is at bedside. Still having difficulty with left side but anti-gravity. Still with headache but slightly better. That day she had a monster drink and Dr. Alcus Dad. She also takes Adderall however says she did not take it that day. No trauma, no inciting events, she wasn't feeling well for a few days so she did not take any of her medications  for several days and she is on clonidine. Mess patient takes regularly include cymbalta, synthroid, tizanidine, brexpiprazole.     OBJECTIVE Vitals:   11/15/18 0430 11/15/18 0500 11/15/18 0600 11/15/18 0700  BP: 135/82 130/81 136/78 136/84  Pulse: 95 99 82 85  Resp: Temp:      TempSrc:      SpO2: 99% 100% 95% 96%  Weight:      Height:        CBC:  Recent Labs  Lab 11/14/18 1450  WBC 10.5  NEUTROABS 7.2  HGB 13.9  HCT 41.1  MCV 88.0  PLT 260    Basic Metabolic Panel:  Recent Labs  Lab 11/14/18 1450  NA 141  K 4.0  CL 105  CO2 24  GLUCOSE 101*  BUN 20  CREATININE 0.80  CALCIUM 9.7    Lipid Panel: No results found for: CHOL, TRIG, HDL, CHOLHDL, VLDL, LDLCALC HgbA1c: No results found for: HGBA1C Urine Drug Screen:     Component Value Date/Time   LABOPIA NONE DETECTED 11/14/2018 1755   COCAINSCRNUR NONE DETECTED 11/14/2018 1755   LABBENZ POSITIVE (A) 11/14/2018 1755   AMPHETMU POSITIVE (A) 11/14/2018 1755   THCU NONE DETECTED 11/14/2018 1755   LABBARB NONE DETECTED 11/14/2018 1755    Alcohol Level No results found for: ETH  IMAGING  Ct Angio Head W Or Wo Contrast 11/14/2018 IMPRESSION:  1. Lobar intraparenchymal hemorrhage within the left parietal lobe with volume  of 33 mL. Moderate edema with mild mass effect on the left occipital lobe.  2. Small amount of subdural extension of hematoma along the posterior falx cerebri.  3. Normal CTA of the intracranial arteries. No aneurysm or vascular malformation.   Mr [name] And Wo Contrast Mr Mrv Head Wo Cm 11/14/2018 IMPRESSION:  1. Unchanged appearance of intraparenchymal hematoma within the left parietal lobe with mild associated mass effect. No abnormal contrast enhancement.  2. No dural venous sinus thrombosis.  3. The etiology of the hemorrhage remains unclear. Follow up MRI with and without contrast with susceptibility-weighted imaging might be helpful in 4-6 weeks.   EKG - SR rate 80  BPM. (See cardiology reading for complete details)   PHYSICAL EXAM Blood pressure 136/84, pulse 85, temperature 98.2 F (36.8 C), temperature source Oral, resp. rate 17, height 5\' 6"  (1.676 m), weight 74.6 kg, last menstrual period 08/28/2016, SpO2 96 %.   Physical exam: Exam: Gen: NAD, conversant, well nourised, obese, well groomed                     CV: RRR, no MRG. No Carotid Bruits. No peripheral edema, warm, nontender Eyes: Conjunctivae clear without exudates or hemorrhage  Neuro: Detailed Neurologic Exam  Speech:    Speech is normal; fluent and spontaneous with normal comprehension.  Cognition:    The patient is oriented to person, place, and time;     recent and remote memory intact;     language fluent;     normal attention, concentration,     fund of knowledge Cranial Nerves:    The pupils are equal, round, and reactive to light.right hemianopia.  Extraocular movements are intact. Trigeminal sensation is intact and the muscles of mastication are normal. The face is symmetric. The palate elevates in the midline. Hearing intact. Voice is normal. Shoulder shrug is normal. The tongue has normal motion without fasciculations.   Coordination:    No ataxia, normal FTN, mild decreased fine finger movements on the right hand  Motor Observation:    No asymmetry, no atrophy, and no involuntary movements noted. Tone:    Normal muscle tone.    Posture:    Posture is normal. normal erect    Strength:    Right lower extremity 4/5, otherwise intact     Sensation: decreased right arm and leg     Reflex Exam:  DTR's:    Deep tendon reflexes in the upper and lower extremities are normal bilaterally.   Toes:    The toes are downgoing bilaterally.   Clonus:    Clonus is absent.        Ms. Wendy Collier is a 53 y.o. female with history of migraines, fibromyalgia, bipolar, hypothyroidism, depression, and psychiatric disorder presenting with headache, confusion followed by  reduced vision on the right side and right leg weakness. She did not receive IV t-PA due to hemorrhage.  Intraparenchymal hematoma within the left parietal lobe - etiology unknown but may be due to excessive caffeine and sympathomimetics (had Monster drink, Dr. Alcus DadPeppers, on Adderall).   Resultant  Mild right-sided weakness and sensory deficit, hemianopia,   CT head - not performed  MR / MRV head - Unchanged appearance of intraparenchymal hematoma within the left parietal lobe with mild associated mass effect. No abnormal contrast enhancement. No dural venous sinus thrombosis. The etiology of the hemorrhage remains unclear. Follow up MRI with and without contrast with susceptibility-weighted imaging might be helpful in 4-6 weeks.  MRA head - not performed  CTA Head - Lobar intraparenchymal hemorrhage within the left parietal lobe with volume of 33 mL. Moderate edema with mild mass effect on the left occipital lobe. Small amount of subdural extension of hematoma along the posterior falx cerebri.   Carotid Doppler - not indicated  2D Echo - not indicated  [employer] Virus 2 - negative  LDL - not indicated  HgbA1c - not indicated  UDS - + for amphetamines and benzodiazepines (pt has Rxs for both)  VTE prophylaxis - SCDs  Diet - regular  No antithrombotic prior to admission, now on No antithrombotic  Ongoing aggressive stroke risk factor management  Therapy recommendations:  pending  Disposition:  Pending  Hypertension  Stable (Cleviprex)  SBP goal < 160 mm Hg . Long-term BP goal normotensive  Other Stroke Risk Factors  Migraines  Other Active Problems  Psychiatric history   Hospital day # 1  Personally examined patient and images, and have participated in and made any corrections needed to history, physical, neuro exam,assessment and plan as stated above.  I have personally obtained the history, evaluated lab date, reviewed imaging studies and agree with radiology  interpretations.    Naomie Dean, MD Stroke Neurology   A total of 35 minutes was spent for the care of this patient, spent on counseling patient and family on different diagnostic and therapeutic options, counseling and coordination of care, riskd ans benefits of management, compliance, or risk factor reduction and education.   To contact Stroke Continuity provider, please refer to [email or web address]. After hours, contact General Neurology

## 2018-11-15 NOTE — Progress Notes (Signed)
OT Cancellation Note  Patient Details Name: Wendy Collier MRN: [id number] DOB: 1965/10/30   Cancelled Treatment:    Reason Eval/Treat Not Completed: Active bedrest order. Will return as schedule allows. Thank you.  Wendy Collier MSOT, OTR/L Acute Rehab Pager: [phone] Office: [phone] 11/15/2018, 9:33 AM

## 2018-11-15 NOTE — Evaluation (Signed)
Speech Language Pathology Evaluation Patient Details Name: Wendy Collier MRN: [id number] DOB: [birth year] Today's Date: 11/15/2018 Time: 1000-1027 SLP Time Calculation (min) (ACUTE ONLY): 27 min  Problem List:  Patient Active Problem List   Diagnosis Date Noted  . ICH (intracerebral hemorrhage) (HCC) 11/14/2018  . Hypothyroidism 09/10/2016  . 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 Anxiety and depression 09/10/2016   Past Medical History:  Past Medical History:  Diagnosis Date  . Anxiety   . Arthritis   . Bipolar 1 disorder (HCC)   . Depression   . Fibromyalgia   . Frequent headaches   . Thyroid disease    Hypothyroidism    Past Surgical History:  Past Surgical History:  Procedure Laterality Date  . ANTERIOR CERVICAL DECOMP/DISCECTOMY FUSION    . BACK SURGERY    . KNEE SURGERY     HPI:  53 y.o. female with past medical history of migraines, fibromyalgia, psychiatric disorder presented to [facility] emergency department with 1 day history of headache, confusion followed by reduced vision on the right side and right leg weakness CT head on 11/14/18 revealed Lobar intraparenchymal hemorrhage within the left parietal lobe with volume of 33 mL. Moderate edema with mild mass effect on the left occipital lobeMRI head on 11/14/18 indicated Unchanged appearance of intraparenchymal hematoma within the left parietal lobe with mild associated mass effect  Assessment / Plan / Recommendation Clinical Impression   [facility] the [facility](Montreal Cognitive Assessment) with the following scores obtained (with 26/30 being average and [facility]); 20/30 (with portion eliminated regarding graphic expression d/t dominant hand affected), so score was 20/25 with deficits noted in the areas of attention (sustained), memory (short-term, retrieval and obtaining new information) and right field neglect noted as well with pt's awareness improving in this area per nursing; (pt unaware of right leg positioning when  SLP arrived for evaluation); oriented x4 and intermittent word finding/anomia present during conversation; pt also stated "my thinking is slower" and processing of information and retrieval was delayed during certain tasks during the SLE; recommend ST while in acute setting and potential for CIR.    SLP Assessment  SLP Recommendation/Assessment: Patient needs continued Speech Language Pathology Services SLP Visit Diagnosis: Aphasia (R47.01);Cognitive communication deficit (R41.841)    Follow Up Recommendations  Inpatient Rehab    Frequency and Duration min 2x/week  1 week      SLP Evaluation Cognition  Overall Cognitive Status: Impaired/Different from baseline Arousal/Alertness: Awake/alert Orientation Level: Oriented X4 Attention: Sustained Sustained Attention: Impaired Sustained Attention Impairment: Verbal complex;Functional complex Memory: Impaired Memory Impairment: Retrieval deficit;Decreased recall of new information;Decreased short term memory Decreased Short Term Memory: Verbal basic;Functional basic Awareness: Appears intact Problem Solving: Appears intact Executive Function: Organizing Organizing: Impaired Organizing Impairment: Verbal basic;Functional basic Safety/Judgment: Impaired       Comprehension  Auditory Comprehension Overall Auditory Comprehension: Appears within functional limits for tasks assessed Yes/No Questions: Within Functional Limits Commands: Within Functional Limits Conversation: Complex Interfering Components: Attention;Visual impairments;Processing speed;Working Radio broadcast assistantmemory EffectiveTechniques: Dietitianxtra processing time;Repetition Visual Recognition/Discrimination Discrimination: Within Owens-IllinoisFunction Limits Reading Comprehension Reading Status: Within funtional limits(for simple decoding only)    Expression Expression Primary Mode of Expression: Verbal Verbal Expression Overall Verbal Expression: Impaired Initiation: No impairment Level of  Generative/Spontaneous Verbalization: Conversation Repetition: No impairment Naming: Impairment Responsive: 26-50% accurate Confrontation: Impaired Convergent: 25-49% accurate Divergent: 25-49% accurate Verbal Errors: Aware of errors Interfering Components: Attention Non-Verbal Means of Communication: Not applicable Written Expression Dominant Hand: Right Written Expression: Not tested   Oral / Motor  Oral Motor/Sensory Function Overall Oral Motor/Sensory Function: Within functional limits Motor Speech Overall Motor Speech: Appears within functional limits for tasks assessed Respiration: Within functional limits Phonation: Normal Resonance: Within functional limits Articulation: Within functional limitis Intelligibility: Intelligible Motor Planning: Witnin functional limits Motor Speech Errors: Not applicable                       Tressie Stalker, M.S., CCC-SLP 11/15/2018, 1:30 PM

## 2018-11-16 LAB — TRIGLYCERIDES: Triglycerides: 162 mg/dL — ABNORMAL HIGH

## 2018-11-16 LAB — HIV ANTIBODY (ROUTINE TESTING W REFLEX): HIV Screen 4th Generation wRfx: NONREACTIVE

## 2018-11-16 MED ORDER — ONDANSETRON HCL 4 MG PO TABS
4.0000 mg | ORAL_TABLET | Freq: Three times a day (TID) | ORAL | Status: DC | PRN
  Administered 2018-11-16 – 2018-11-17 (×2): 4 mg via ORAL
  Filled 2018-11-16 (×2): qty 1

## 2018-11-16 MED ORDER — BENAZEPRIL HCL 20 MG PO TABS
10.0000 mg | ORAL_TABLET | Freq: Two times a day (BID) | ORAL | Status: DC
  Administered 2018-11-16 – 2018-11-17 (×3): 10 mg via ORAL
  Filled 2018-11-16 (×3): qty 1

## 2018-11-16 MED ORDER — TRAMADOL HCL 50 MG PO TABS
50.0000 mg | ORAL_TABLET | ORAL | Status: DC | PRN
  Administered 2018-11-16 – 2018-11-17 (×5): 50 mg via ORAL
  Filled 2018-11-16 (×5): qty 1

## 2018-11-16 MED ORDER — DIPHENHYDRAMINE HCL 50 MG/ML IJ SOLN
12.5000 mg | Freq: Once | INTRAMUSCULAR | Status: AC
  Administered 2018-11-16: 12.5 mg via INTRAVENOUS
  Filled 2018-11-16: qty 1

## 2018-11-16 MED ORDER — DIPHENHYDRAMINE HCL 25 MG PO CAPS
25.0000 mg | ORAL_CAPSULE | Freq: Four times a day (QID) | ORAL | Status: DC | PRN
  Administered 2018-11-16 – 2018-11-17 (×2): 25 mg via ORAL
  Filled 2018-11-16 (×2): qty 1

## 2018-11-16 MED ORDER — METOPROLOL SUCCINATE ER 25 MG PO TB24
25.0000 mg | ORAL_TABLET | Freq: Every day | ORAL | Status: DC
  Administered 2018-11-16 – 2018-11-17 (×2): 25 mg via ORAL
  Filled 2018-11-16 (×2): qty 1

## 2018-11-16 MED ORDER — HYDRALAZINE HCL 20 MG/ML IJ SOLN
10.0000 mg | INTRAMUSCULAR | Status: DC | PRN
  Filled 2018-11-16: qty 1

## 2018-11-16 MED ORDER — ONDANSETRON HCL 4 MG/2ML IJ SOLN
4.0000 mg | Freq: Four times a day (QID) | INTRAMUSCULAR | Status: DC | PRN
  Administered 2018-11-16 – 2018-11-17 (×2): 4 mg via INTRAVENOUS
  Filled 2018-11-16 (×2): qty 2

## 2018-11-16 MED ORDER — WHITE PETROLATUM EX OINT
TOPICAL_OINTMENT | CUTANEOUS | Status: AC
  Filled 2018-11-16: qty 28.35

## 2018-11-16 NOTE — Progress Notes (Addendum)
STROKE TEAM PROGRESS NOTE   HISTORY OF PRESENT ILLNESS (per record) Wendy Collier is a 53 y.o. female with past medical history of migraines, fibromyalgia, psychiatric disorder presented to [facility] emergency department with 1 day history of headache, confusion followed by reduced vision on the right side and right leg weakness.  She states that her headache started yesterday and she thought this was due to her migraines.  She then noticed today that she had weakness in her right leg as well as loss of control over her right hand.  She also felt that she has not been seeing as well and decided to present to the emergency room.  Her blood pressure at [facility]  was 138 x 87 mmHg, however subsequently increased to about 150 systolic.  CT head was obtained which showed a moderate sized, approximately 30 cc hemorrhage in the left posterior medial parietal  as well as occipital lobe.  Neurology at [facility] was consulted and a CTA head as well as the MR brain and MR venogram was performed prior to transfer. No dural venous sinus thrombosis was noted on MR venogram.  CTA showed no evidence of aneurysm or AVM.  There was some subdural extension of the hematoma.  On arrival patient is awake and following commands.  Blood pressure slightly low 140 systolic and therefore Cardene drip was started.  The patient denies any history of drug abuse. Does state that she drinks "Monsters" caffeinated drinks infrequently needs to having 1 yesterday.   Date last known well:  Time last known well:  tPA Given:  NIHSS: 5 Baseline MRS 0   SUBJECTIVE (INTERVAL HISTORY) Her nurse is at bedside. Improved strength. Still with headache but better. Today she states that she has had high blood pressure at home and was on medications in the past but stopped. The day of the bleed she also had a monster drink and Dr. Alcus Dad. She also takes Adderall. No trauma, no inciting events, she wasn't feeling well for a few days so  she did not take any of her medications for several days and she is on clonidine. Mess patient takes regularly include cymbalta, synthroid, tizanidine, brexpiprazole.     OBJECTIVE Vitals:   11/16/18 0355 11/16/18 0400 11/16/18 0600 11/16/18 0700  BP:  132/77 139/79 136/73  Pulse:  72 78 72  Resp:  16 17 19   Temp: 98 F (36.7 C)     TempSrc: Axillary     SpO2:  96% 96% 98%  Weight:      Height:        CBC:  Recent Labs  Lab 11/14/18 1450  WBC 10.5  NEUTROABS 7.2  HGB 13.9  HCT 41.1  MCV 88.0  PLT 260    Basic Metabolic Panel:  Recent Labs  Lab 11/14/18 1450  NA 141  K 4.0  CL 105  CO2 24  GLUCOSE 101*  BUN 20  CREATININE 0.80  CALCIUM 9.7    Lipid Panel:     Component Value Date/Time   TRIG 162 (H) 11/16/2018 0210   HgbA1c: No results found for: HGBA1C Urine Drug Screen:     Component Value Date/Time   LABOPIA NONE DETECTED 11/14/2018 1755   COCAINSCRNUR NONE DETECTED 11/14/2018 1755   LABBENZ POSITIVE (A) 11/14/2018 1755   AMPHETMU POSITIVE (A) 11/14/2018 1755   THCU NONE DETECTED 11/14/2018 1755   LABBARB NONE DETECTED 11/14/2018 1755    Alcohol Level No results found for: ETH  IMAGING  Ct Angio Head  W Or Wo Contrast 11/14/2018 IMPRESSION:  1. Lobar intraparenchymal hemorrhage within the left parietal lobe with volume of 33 mL. Moderate edema with mild mass effect on the left occipital lobe.  2. Small amount of subdural extension of hematoma along the posterior falx cerebri.  3. Normal CTA of the intracranial arteries. No aneurysm or vascular malformation.   Mr [name] And Wo Contrast Mr Mrv Head Wo Cm 11/14/2018 IMPRESSION:  1. Unchanged appearance of intraparenchymal hematoma within the left parietal lobe with mild associated mass effect. No abnormal contrast enhancement.  2. No dural venous sinus thrombosis.  3. The etiology of the hemorrhage remains unclear. Follow up MRI with and without contrast with susceptibility-weighted imaging  might be helpful in 4-6 weeks.   EKG - SR rate 80 BPM. (See cardiology reading for complete details)   PHYSICAL EXAM Blood pressure 136/73, pulse 72, temperature 98 F (36.7 C), temperature source Axillary, resp. rate 19, height 5\' 6"  (1.676 m), weight 74.6 kg, last menstrual period 08/28/2016, SpO2 98 %.   Physical exam: Exam: Gen: NAD, conversant, well nourised, obese, well groomed                     CV: RRR, no MRG. No Carotid Bruits. No peripheral edema, warm, nontender Eyes: Conjunctivae clear without exudates or hemorrhage  Neuro: Detailed Neurologic Exam  Speech:    Speech is normal; fluent and spontaneous with normal comprehension.  Cognition:    The patient is oriented to person, place, and time;     recent and remote memory intact;     language fluent;     normal attention, concentration,     fund of knowledge Cranial Nerves:    The pupils are equal, round, and reactive to light.right hemianopia.  Extraocular movements are intact. Trigeminal sensation is intact and the muscles of mastication are normal. The face is symmetric. The palate elevates in the midline. Hearing intact. Voice is normal. Shoulder shrug is normal. The tongue has normal motion without fasciculations.   Coordination:    No ataxia, normal FTN  Motor Observation:    No asymmetry, no atrophy, and no involuntary movements noted. Tone:    Normal muscle tone.    Posture:    Posture is normal. normal erect    Strength:    Right lower extremity 5-/5, otherwise intact     Sensation: decreased right arm and leg     Reflex Exam:  DTR's:    Deep tendon reflexes in the upper and lower extremities are normal bilaterally.   Toes:    The toes are downgoing bilaterally.   Clonus:    Clonus is absent.        Ms. Wendy Collier is a 53 y.o. female with history of migraines, fibromyalgia, bipolar, hypothyroidism, depression, and psychiatric disorder presenting with headache, confusion followed by  reduced vision on the right side and right leg weakness. She did not receive IV t-PA due to hemorrhage.  Intraparenchymal hematoma within the left parietal lobe - etiology likely due to untreated hypertension with excessive caffeine and sympathomimetics (had Monster drink, Dr. Alcus DadPeppers, on Adderall).   Resultant  Mild right-sided weakness and sensory deficit, hemianopia,   CT head - not performed  MR / MRV head - Unchanged appearance of intraparenchymal hematoma within the left parietal lobe with mild associated mass effect. No abnormal contrast enhancement. No dural venous sinus thrombosis. The etiology of the hemorrhage remains unclear. Follow up MRI with and without contrast with  susceptibility-weighted imaging might be helpful in 4-6 weeks.  MRA head - not performed  CTA Head - Lobar intraparenchymal hemorrhage within the left parietal lobe with volume of 33 mL. Moderate edema with mild mass effect on the left occipital lobe. Small amount of subdural extension of hematoma along the posterior falx cerebri.   Carotid Doppler - not indicated  2D Echo - not indicated  [employer] Virus 2 - negative  LDL - not indicated  HgbA1c - not indicated  UDS - + for amphetamines and benzodiazepines (pt has Rxs for both)  VTE prophylaxis - SCDs  Diet - regular  No antithrombotic prior to admission, now on No antithrombotic  Ongoing aggressive stroke risk factor management  Therapy recommendations:  pending  Disposition:  Pending. Likely Home PT. Will transfer to floor and suspect can discharge tomorrow.  Hypertension  Stable (Cleviprex)  SBP goal < 160 mm Hg . Long-term BP goal normotensive . start metoprolol and lisinopril  Other Stroke Risk Factors  Migraines  Other Active Problems  Psychiatric history   Hospital day # 2  Personally examined patient and images, and have participated in and made any corrections needed to history, physical, neuro exam,assessment and plan  as stated above.  I have personally obtained the history, evaluated lab date, reviewed imaging studies and agree with radiology interpretations.      A total of 25 minutes was spent for the care of this patient, spent on counseling patient and family on different diagnostic and therapeutic options, counseling and coordination of care, riskd ans benefits of management, compliance, or risk factor reduction and education.   To contact Stroke Continuity provider, please refer to [email or web address]. After hours, contact General Neurology

## 2018-11-16 NOTE — Progress Notes (Signed)
Gave report to [employer] on 3W to transfer.

## 2018-11-16 NOTE — Progress Notes (Signed)
Pt states tylenol has not been helping her HA much and complains of nausea.  Tramadol and Zofran ordered per Dr. Lucia Gaskins.

## 2018-11-16 NOTE — Progress Notes (Addendum)
Received from 4N ICU via bed; patient is alert and oriented. Oriented patient to room and unit routine; reports a mild headache presently; will follow up with prn medication when possible; BP is WNL. Reviewed fall safety; bed is low and locked with the bed alarm in place/on.

## 2018-11-16 NOTE — Evaluation (Signed)
Occupational Therapy Evaluation Patient Details Name: Wendy Collier MRN: [id number] DOB: [birth year] Today's Date: 11/16/2018    History of Present Illness Wendy Collier is a 53 y.o. female with past medical history of migraines, fibromyalgia, psychiatric disorder, "bad knees" presented to [facility] emergency department with 1 day history of headache, confusion followed by reduced vision on the right side and right leg weakness. CT: moderate sized, approximately 30 cc hemorrhage in the left posterior medial parietal  as well as occipital lobe   Clinical Impression   This 53 yo female admitted with above presents to acute OT with decreased coordination/proprioception of right side, decreased vision right lower quadrant both eyes, "I feel like the right hand side of my body doesn't belong to me, decreased balance, decreased attention to right side, and decreased mobility all affecting her PLOF of being independent with basic and IADLs. Pt will continue to benefit from acute OT with follow up OT on CIR.     Follow Up Recommendations  CIR;Supervision/Assistance - 24 hour    Equipment Recommendations  Other (comment)(TBD at next venue)       Precautions / Restrictions Precautions Precautions: Fall Restrictions Weight Bearing Restrictions: No      Mobility Bed Mobility Overal bed mobility: Needs Assistance Bed Mobility: Supine to Sit     Supine to sit: Min guard;HOB elevated     General bed mobility comments: coming out on left hand side of bed, pt reported she needs to watch and bring  her RLE with her, but then she kept scooting foreward with her RLE bent back beside her  Transfers Overall transfer level: Needs assistance Equipment used: 2 person hand held assist Transfers: Sit to/from Stand Sit to Stand: Mod assist              Balance Overall balance assessment: Needs assistance Sitting-balance support: Feet supported;No upper extremity supported Sitting balance-Leahy  Scale: Fair Sitting balance - Comments: On one occassion she totally lost her balance backwards while seated EOB   Standing balance support: Bilateral upper extremity supported Standing balance-Leahy Scale: Zero                             ADL either performed or assessed with clinical judgement   ADL Overall ADL's : Needs assistance/impaired Eating/Feeding: Set up;Sitting   Grooming: Minimal assistance;Sitting   Upper Body Bathing: Minimal assistance;Sitting   Lower Body Bathing: Moderate assistance Lower Body Bathing Details (indicate cue type and reason): Mod A +2 sit<>stand Upper Body Dressing : Minimal assistance;Sitting   Lower Body Dressing: Moderate assistance Lower Body Dressing Details (indicate cue type and reason): Mod A +2 sit<>stand Toilet Transfer: Moderate assistance;+2 for physical assistance;Ambulation Toilet Transfer Details (indicate cue type and reason): Bil HHA Toileting- Clothing Manipulation and Hygiene: Moderate assistance Toileting - Clothing Manipulation Details (indicate cue type and reason): Mod A +2 sit<>stand             Vision Baseline Vision/History: Wears glasses Wears Glasses: Reading only(driving glasses also) Patient Visual Report: Other (comment)(I have to turn my head to look at you on the right to see you) Vision Assessment?: Yes Eye Alignment: Within Functional Limits Ocular Range of Motion: Within Functional Limits Alignment/Gaze Preference: Within Defined Limits Tracking/Visual Pursuits: Able to track stimulus in all quads without difficulty Convergence: Within functional limits Visual Fields: Right inferior homonymous quadranopsia            Pertinent Vitals/Pain Pain Assessment: 0-10 Pain  Score: 2  Pain Location: headache behind her eyes Pain Descriptors / Indicators: Headache Pain Intervention(s): Limited activity within patient's tolerance;Monitored during session     Hand Dominance Right    Extremity/Trunk Assessment Upper Extremity Assessment Upper Extremity Assessment: RUE deficits/detail RUE Deficits / Details: Decreased coordination, decreased attention, strength is good, does report she can feel on the right arm but if feels differently from the left RUE Sensation: decreased proprioception RUE Coordination: decreased fine motor;decreased gross motor           Communication Communication Communication: No difficulties   Cognition Arousal/Alertness: Awake/alert Behavior During Therapy: WFL for tasks assessed/performed Overall Cognitive Status: Impaired/Different from baseline Area of Impairment: Attention;Memory;Safety/judgement;Awareness;Problem solving                   Current Attention Level: Selective Memory: Decreased short-term memory   Safety/Judgement: Decreased awareness of safety;Decreased awareness of deficits Awareness: Emergent Problem Solving: Difficulty sequencing;Requires verbal cues General Comments: Pt also had trouble answering some of our household questions; aware of right side deficits but does not alwasys attend to right side              Home Living Family/patient expects to be discharged to:: Private residence Living Arrangements: Spouse/significant other Available Help at Discharge: Family;Available 24 hours/day Type of Home: House Home Access: Stairs to enter [employer] of Steps: 2   Home Layout: One level     Bathroom Shower/Tub: Tub/shower unit;Curtain   Firefighter: Standard     Home Equipment: Environmental consultant - 2 wheels;Cane - single point;Grab bars - tub/shower      Lives With: Spouse    Prior Functioning/Environment Level of Independence: Independent                 OT Problem List: Decreased cognition;Impaired balance (sitting and/or standing);Decreased coordination;Decreased safety awareness;Decreased knowledge of use of DME or AE;Impaired sensation;Impaired UE functional use;Pain       OT Treatment/Interventions: Self-care/ADL training;Balance training;Therapeutic activities;DME and/or AE instruction;Patient/family education;Visual/perceptual remediation/compensation    OT Goals(Current goals can be found in the care plan section) Acute Rehab OT Goals Patient Stated Goal: really wants to go home, but does not want her husband to have to do as much as he would have to if she did go home now--so is open to rehab. OT Goal Formulation: With patient Time For Goal Achievement: 11/30/18 Potential to Achieve Goals: Good  OT Frequency: Min 3X/week              AM-PAC OT "6 Clicks" Daily Activity     Outcome Measure Help from another person eating meals?: A Little Help from another person taking care of personal grooming?: A Little Help from another person toileting, which includes using toliet, bedpan, or urinal?: A Lot Help from another person bathing (including washing, rinsing, drying)?: A Lot Help from another person to put on and taking off regular upper body clothing?: A Little Help from another person to put on and taking off regular lower body clothing?: A Lot 6 Click Score: 15   End of Session Equipment Utilized During Treatment: Gait belt  Activity Tolerance: Patient tolerated treatment well Patient left: in chair;with call bell/phone within reach;with chair alarm set  OT Visit Diagnosis: Unsteadiness on feet (R26.81);Other abnormalities of gait and mobility (R26.89);Low vision, both eyes (H54.2);Other symptoms and signs involving cognitive function;Hemiplegia and hemiparesis;Pain Hemiplegia - Right/Left: Right Hemiplegia - dominant/non-dominant: Dominant Hemiplegia - caused by: Nontraumatic intracerebral hemorrhage Pain - part of body: (headache)  Time: 9604-54090946-1016 OT Time Calculation (min): 30 min Charges:  OT General Charges $OT Visit: 1 Visit OT Evaluation $OT Eval Moderate Complexity: 1 Mod OT Treatments $Self Care/Home Management : 8-22  mins  Ignacia Palmaathy Hargis Vandyne, OTR/L [facility] Pager [phone] Office [phone]     Evette GeorgesLeonard, Terry Abila Eva 11/16/2018, 10:49 AM

## 2018-11-16 NOTE — Evaluation (Signed)
Physical Therapy Evaluation Patient Details Name: Wendy Collier MRN: [id number] DOB: 1966/06/08 Today's Date: 11/16/2018   History of Present Illness  Wendy Collier is a 53 y.o. female with PMH migraines, fibromyalgia, psychiatric disorder, "bad knees" presented to [facility] ED with 1 day hx of HA, confusion followed by reduced vision on the right side and RLE weakness. CT: moderate sized, approximately 30 cc hemorrhage in the left posterior medial parietal as well as occipital lobe. NIH:5.  Clinical Impression  Patient presents with right inattention, impaired sensation, incoordination RUE/LE, impaired proprioception, visual deficits, impaired balance, cognitive deficits and impaired mobility s/p above. Pt independent PTA and lives with spouse. Today, pt requires Mod-Max A for transfers and gait training due to ataxia, difficulty with RLE and imbalance. Pt with impaired attention, awareness and problem solving. Pt is highly motivated to return to PLOF. Would benefit from CIR to maximize independence and mobility prior to return home. Will follow acutely.     Follow Up Recommendations CIR;Supervision for mobility/OOB;Supervision/Assistance - 24 hour    Equipment Recommendations  Other (comment)(defer to next venue)    Recommendations for Other Services       Precautions / Restrictions Precautions Precautions: Fall Precaution Comments: SBP <140 Restrictions Weight Bearing Restrictions: No      Mobility  Bed Mobility Overal bed mobility: Needs Assistance Bed Mobility: Supine to Sit     Supine to sit: Min guard;HOB elevated     General bed mobility comments: coming out on left side of bed, pt reported she needs to watch and bring her RLE with her, but then she kept scooting forward with her RLE bent back beside her and needed cues to reposition.  Transfers Overall transfer level: Needs assistance Equipment used: 2 person hand held assist Transfers: Sit to/from Stand Sit to  Stand: Mod assist;+2 physical assistance         General transfer comment: Assist of 2 to power to standing with difficulty at the mid point to transitionto get fully upright; BLEs locked out into extension. Transferred to chair post ambulation.  Ambulation/Gait Ambulation/Gait assistance: Max assist;+2 physical assistance Gait Distance (Feet): 8 Feet Assistive device: 2 person hand held assist Gait Pattern/deviations: Step-through pattern;Ataxic;Decreased step length - left;Decreased stance time - right;Narrow base of support Gait velocity: decreased Gait velocity interpretation: <1.31 ft/sec, indicative of household ambulator General Gait Details: Slow, deliberate and ataxic like gait with narrow BoS and difficulty controlling placement of RLE using visual cues and therapist's foot. Cues to wide Bos. Used vision to help with LE advancement. right knee instability noted. Blaming poor balance/walking on "bad knees."  Stairs            Wheelchair Mobility    Modified Rankin (Stroke Patients Only) Modified Rankin (Stroke Patients Only) Pre-Morbid Rankin Score: No symptoms Modified Rankin: Moderately severe disability     Balance Overall balance assessment: Needs assistance Sitting-balance support: Feet supported;No upper extremity supported Sitting balance-Leahy Scale: Fair Sitting balance - Comments: On one occassion she totally lost her balance backwards while seated EOB   Standing balance support: Bilateral upper extremity supported Standing balance-Leahy Scale: Zero Standing balance comment: Requires external support for standing balance -static and dynamic.                             Pertinent Vitals/Pain Pain Assessment: 0-10 Pain Score: 2  Pain Location: headache behind her eyes Pain Descriptors / Indicators: Headache Pain Intervention(s): Monitored during session;Limited activity within  patient's tolerance    Home Living Family/patient expects to  be discharged to:: Private residence Living Arrangements: Spouse/significant other Available Help at Discharge: Family;Available 24 hours/day Type of Home: House Home Access: Stairs to enter   [employer] of Steps: 2 Home Layout: One level Home Equipment: Walker - 2 wheels;Cane - single point;Grab bars - tub/shower      Prior Function Level of Independence: Independent         Comments: Loves to do arts and crafts, painting; was sewing surgical masks for people PTA     Hand Dominance   Dominant Hand: Right    Extremity/Trunk Assessment   Upper Extremity Assessment Upper Extremity Assessment: Defer to OT evaluation RUE Deficits / Details: Decreased coordination, decreased attention, strength is good, does report she can feel on the right arm but if feels differently from the left RUE Sensation: decreased proprioception RUE Coordination: decreased fine motor;decreased gross motor    Lower Extremity Assessment Lower Extremity Assessment: RLE deficits/detail RLE Deficits / Details: Grossly ~4/5 throughout except hip flexion ~3/5 with giveway weakness. Reports hx of knee issues.  RLE Sensation: decreased light touch;decreased proprioception RLE Coordination: decreased fine motor;decreased gross motor       Communication   Communication: No difficulties  Cognition Arousal/Alertness: Awake/alert Behavior During Therapy: WFL for tasks assessed/performed Overall Cognitive Status: Impaired/Different from baseline Area of Impairment: Attention;Memory;Safety/judgement;Awareness;Problem solving                   Current Attention Level: Selective Memory: Decreased short-term memory   Safety/Judgement: Decreased awareness of safety;Decreased awareness of deficits Awareness: Emergent Problem Solving: Difficulty sequencing;Requires verbal cues General Comments: Pt also had trouble answering some of our household questions; aware of right side deficits but does  not always attend to right side. Some right inattention.       General Comments General comments (skin integrity, edema, etc.): HR ranged from 90-125 bpm.    Exercises     Assessment/Plan    PT Assessment Patient needs continued PT services  PT Problem List Decreased strength;Decreased balance;Decreased cognition;Pain;Decreased mobility;Impaired sensation;Decreased activity tolerance;Decreased coordination;Decreased safety awareness;Decreased range of motion       PT Treatment Interventions Functional mobility training;Balance training;Patient/family education;DME instruction;Gait training;Therapeutic activities;Neuromuscular re-education;Therapeutic exercise;Cognitive remediation;Stair training    PT Goals (Current goals can be found in the Care Plan section)  Acute Rehab PT Goals Patient Stated Goal: really wants to go home, but does not want her husband to have to do as much as he would have to if she did go home now--so is open to rehab. PT Goal Formulation: With patient Time For Goal Achievement: 11/30/18 Potential to Achieve Goals: Good    Frequency Min 4X/week   Barriers to discharge        Co-evaluation PT/OT/SLP Co-Evaluation/Treatment: Yes Reason for Co-Treatment: To address functional/ADL transfers;For patient/therapist safety PT goals addressed during session: Mobility/safety with mobility;Balance;Strengthening/ROM         AM-PAC PT "6 Clicks" Mobility  Outcome Measure Help needed turning from your back to your side while in a flat bed without using bedrails?: A Little Help needed moving from lying on your back to sitting on the side of a flat bed without using bedrails?: A Little Help needed moving to and from a bed to a chair (including a wheelchair)?: A Lot Help needed standing up from a chair using your arms (e.g., wheelchair or bedside chair)?: A Lot Help needed to walk in hospital room?: A Lot Help needed climbing 3-5 steps  with a railing? : Total 6  Click Score: 13    End of Session Equipment Utilized During Treatment: Gait belt Activity Tolerance: Patient tolerated treatment well Patient left: in chair;with call bell/phone within reach Nurse Communication: Mobility status PT Visit Diagnosis: Pain;Difficulty in walking, not elsewhere classified (R26.2);Other abnormalities of gait and mobility (R26.89);Unsteadiness on feet (R26.81);Ataxic gait (R26.0) Pain - part of body: (head)    Time: 6945-0388 PT Time Calculation (min) (ACUTE ONLY): 28 min   Charges:   PT Evaluation $PT Eval Moderate Complexity: 1 Mod          Mylo Red, PT, DPT Acute Rehabilitation Services Pager [phone] Office [phone]      Blake Divine A Lanier Ensign 11/16/2018, 1:08 PM

## 2018-11-17 ENCOUNTER — Encounter (HOSPITAL_COMMUNITY): Payer: Self-pay | Admitting: Physical Medicine and Rehabilitation

## 2018-11-17 ENCOUNTER — Other Ambulatory Visit: Payer: Self-pay

## 2018-11-17 ENCOUNTER — Inpatient Hospital Stay (HOSPITAL_COMMUNITY)
Admission: RE | Admit: 2018-11-17 | Discharge: 2018-11-18 | DRG: 057 | Discharge status: Against Medical Advice | Payer: Medicare Other | Source: Intra-hospital | Attending: Physical Medicine & Rehabilitation | Admitting: Physical Medicine & Rehabilitation

## 2018-11-17 ENCOUNTER — Inpatient Hospital Stay (HOSPITAL_COMMUNITY): Payer: Medicare Other

## 2018-11-17 ENCOUNTER — Encounter (HOSPITAL_COMMUNITY): Payer: Self-pay | Admitting: *Deleted

## 2018-11-17 DIAGNOSIS — I619 Nontraumatic intracerebral hemorrhage, unspecified: Secondary | ICD-10-CM

## 2018-11-17 DIAGNOSIS — R112 Nausea with vomiting, unspecified: Secondary | ICD-10-CM | POA: Diagnosis present

## 2018-11-17 DIAGNOSIS — E039 Hypothyroidism, unspecified: Secondary | ICD-10-CM | POA: Diagnosis present

## 2018-11-17 DIAGNOSIS — F319 Bipolar disorder, unspecified: Secondary | ICD-10-CM | POA: Diagnosis present

## 2018-11-17 DIAGNOSIS — Z825 Family history of asthma and other chronic lower respiratory diseases: Secondary | ICD-10-CM

## 2018-11-17 DIAGNOSIS — H53461 Homonymous bilateral field defects, right side: Secondary | ICD-10-CM | POA: Diagnosis present

## 2018-11-17 DIAGNOSIS — Z79899 Other long term (current) drug therapy: Secondary | ICD-10-CM

## 2018-11-17 DIAGNOSIS — F410 Panic disorder [episodic paroxysmal anxiety] without agoraphobia: Secondary | ICD-10-CM | POA: Diagnosis present

## 2018-11-17 DIAGNOSIS — Z8051 Family history of malignant neoplasm of kidney: Secondary | ICD-10-CM

## 2018-11-17 DIAGNOSIS — Z79891 Long term (current) use of opiate analgesic: Secondary | ICD-10-CM

## 2018-11-17 DIAGNOSIS — F909 Attention-deficit hyperactivity disorder, unspecified type: Secondary | ICD-10-CM

## 2018-11-17 DIAGNOSIS — Z813 Family history of other psychoactive substance abuse and dependence: Secondary | ICD-10-CM | POA: Diagnosis not present

## 2018-11-17 DIAGNOSIS — M797 Fibromyalgia: Secondary | ICD-10-CM | POA: Diagnosis present

## 2018-11-17 DIAGNOSIS — Z811 Family history of alcohol abuse and dependence: Secondary | ICD-10-CM | POA: Diagnosis not present

## 2018-11-17 DIAGNOSIS — Z818 Family history of other mental and behavioral disorders: Secondary | ICD-10-CM | POA: Diagnosis not present

## 2018-11-17 DIAGNOSIS — K588 Other irritable bowel syndrome: Secondary | ICD-10-CM

## 2018-11-17 DIAGNOSIS — I69251 Hemiplegia and hemiparesis following other nontraumatic intracranial hemorrhage affecting right dominant side: Secondary | ICD-10-CM | POA: Diagnosis not present

## 2018-11-17 DIAGNOSIS — Z5329 Procedure and treatment not carried out because of patient's decision for other reasons: Secondary | ICD-10-CM | POA: Diagnosis not present

## 2018-11-17 DIAGNOSIS — E038 Other specified hypothyroidism: Secondary | ICD-10-CM

## 2018-11-17 DIAGNOSIS — S06360A Traumatic hemorrhage of cerebrum, unspecified, without loss of consciousness, initial encounter: Secondary | ICD-10-CM

## 2018-11-17 DIAGNOSIS — I10 Essential (primary) hypertension: Secondary | ICD-10-CM | POA: Diagnosis present

## 2018-11-17 DIAGNOSIS — I69293 Ataxia following other nontraumatic intracranial hemorrhage: Secondary | ICD-10-CM | POA: Diagnosis present

## 2018-11-17 DIAGNOSIS — S0633AA Contusion and laceration of cerebrum, unspecified, with loss of consciousness status unknown, initial encounter: Secondary | ICD-10-CM

## 2018-11-17 DIAGNOSIS — I152 Hypertension secondary to endocrine disorders: Secondary | ICD-10-CM

## 2018-11-17 DIAGNOSIS — I611 Nontraumatic intracerebral hemorrhage in hemisphere, cortical: Secondary | ICD-10-CM | POA: Diagnosis not present

## 2018-11-17 DIAGNOSIS — G8929 Other chronic pain: Secondary | ICD-10-CM | POA: Diagnosis present

## 2018-11-17 DIAGNOSIS — Z79890 Hormone replacement therapy (postmenopausal): Secondary | ICD-10-CM

## 2018-11-17 DIAGNOSIS — Z981 Arthrodesis status: Secondary | ICD-10-CM

## 2018-11-17 MED ORDER — VITAMIN C 250 MG PO TABS
250.0000 mg | ORAL_TABLET | Freq: Every day | ORAL | Status: DC
  Administered 2018-11-18: 250 mg via ORAL
  Filled 2018-11-17 (×2): qty 1

## 2018-11-17 MED ORDER — BREXPIPRAZOLE 2 MG PO TABS
2.0000 mg | ORAL_TABLET | Freq: Every day | ORAL | Status: DC
  Administered 2018-11-18: 2 mg via ORAL
  Filled 2018-11-17 (×2): qty 1

## 2018-11-17 MED ORDER — SENNOSIDES-DOCUSATE SODIUM 8.6-50 MG PO TABS: 2.0000 | ORAL_TABLET | Freq: Every day | ORAL | Status: DC

## 2018-11-17 MED ORDER — ALPRAZOLAM 0.5 MG PO TABS
0.5000 mg | ORAL_TABLET | Freq: Three times a day (TID) | ORAL | Status: DC | PRN
  Administered 2018-11-18: 0.5 mg via ORAL
  Filled 2018-11-17: qty 1

## 2018-11-17 MED ORDER — BREXPIPRAZOLE 0.25 MG HALF TABLET: 2.0000 mg | ORAL_TABLET | Freq: Every day | ORAL | Status: DC

## 2018-11-17 MED ORDER — CLONIDINE HCL 0.1 MG PO TABS: 0.1000 mg | ORAL_TABLET | ORAL | Status: DC | PRN

## 2018-11-17 MED ORDER — LEVOTHYROXINE SODIUM 50 MCG PO TABS
50.0000 ug | ORAL_TABLET | Freq: Every day | ORAL | Status: DC
  Administered 2018-11-18: 50 ug via ORAL
  Filled 2018-11-17: qty 1

## 2018-11-17 MED ORDER — BISACODYL 10 MG RE SUPP: 10.0000 mg | Freq: Every day | RECTAL | Status: DC | PRN

## 2018-11-17 MED ORDER — METOPROLOL SUCCINATE ER 25 MG PO TB24
25.0000 mg | ORAL_TABLET | Freq: Every day | ORAL | Status: DC
  Administered 2018-11-18: 25 mg via ORAL
  Filled 2018-11-17: qty 1

## 2018-11-17 MED ORDER — BREXPIPRAZOLE 2 MG PO TABS: 2.0000 mg | ORAL_TABLET | Freq: Every day | ORAL | Status: DC

## 2018-11-17 MED ORDER — ADULT MULTIVITAMIN W/MINERALS CH
1.0000 | ORAL_TABLET | Freq: Every day | ORAL | Status: DC
  Administered 2018-11-18: 1 via ORAL
  Filled 2018-11-17: qty 1

## 2018-11-17 MED ORDER — BREXPIPRAZOLE 0.25 MG HALF TABLET
2.0000 mg | ORAL_TABLET | Freq: Every day | ORAL | Status: DC
  Filled 2018-11-17: qty 8

## 2018-11-17 MED ORDER — TRAMADOL HCL 50 MG PO TABS: 50.0000 mg | ORAL_TABLET | ORAL | Status: DC | PRN

## 2018-11-17 MED ORDER — DULOXETINE HCL 30 MG PO CPEP
70.0000 mg | ORAL_CAPSULE | Freq: Every day | ORAL | Status: DC
  Administered 2018-11-18: 70 mg via ORAL
  Filled 2018-11-17 (×2): qty 2

## 2018-11-17 MED ORDER — GUAIFENESIN-DM 100-10 MG/5ML PO SYRP: 5.0000 mL | ORAL_SOLUTION | Freq: Four times a day (QID) | ORAL | Status: DC | PRN

## 2018-11-17 MED ORDER — ACETAMINOPHEN 325 MG PO TABS: 325.0000 mg | ORAL_TABLET | ORAL | Status: DC | PRN

## 2018-11-17 MED ORDER — ZOLPIDEM TARTRATE 5 MG PO TABS
5.0000 mg | ORAL_TABLET | Freq: Every evening | ORAL | Status: DC | PRN
  Filled 2018-11-17: qty 1

## 2018-11-17 MED ORDER — BENAZEPRIL HCL 5 MG PO TABS
10.0000 mg | ORAL_TABLET | Freq: Two times a day (BID) | ORAL | Status: DC
  Administered 2018-11-17 – 2018-11-18 (×3): 10 mg via ORAL
  Filled 2018-11-17 (×3): qty 2

## 2018-11-17 MED ORDER — ALUM & MAG HYDROXIDE-SIMETH 200-200-20 MG/5ML PO SUSP: 30.0000 mL | ORAL | Status: DC | PRN

## 2018-11-17 MED ORDER — FLEET ENEMA 7-19 GM/118ML RE ENEM: 1.0000 | ENEMA | Freq: Once | RECTAL | Status: DC | PRN

## 2018-11-17 MED ORDER — ONDANSETRON HCL 4 MG PO TABS
4.0000 mg | ORAL_TABLET | Freq: Three times a day (TID) | ORAL | Status: DC
  Administered 2018-11-17 – 2018-11-18 (×4): 4 mg via ORAL
  Filled 2018-11-17 (×4): qty 1

## 2018-11-17 MED ORDER — PANTOPRAZOLE SODIUM 40 MG PO TBEC
40.0000 mg | DELAYED_RELEASE_TABLET | Freq: Every day | ORAL | Status: DC
  Administered 2018-11-17: 40 mg via ORAL
  Filled 2018-11-17: qty 1

## 2018-11-17 MED ORDER — PANTOPRAZOLE SODIUM 40 MG PO TBEC: 40.0000 mg | DELAYED_RELEASE_TABLET | Freq: Every day | ORAL | Status: DC

## 2018-11-17 MED ORDER — DIPHENHYDRAMINE HCL 12.5 MG/5ML PO ELIX: 12.5000 mg | ORAL_SOLUTION | Freq: Four times a day (QID) | ORAL | Status: DC | PRN

## 2018-11-17 MED ORDER — POLYETHYLENE GLYCOL 3350 17 G PO PACK: 17.0000 g | PACK | Freq: Every day | ORAL | Status: DC | PRN

## 2018-11-17 MED ORDER — TIZANIDINE HCL 4 MG PO TABS
4.0000 mg | ORAL_TABLET | Freq: Every day | ORAL | Status: DC
  Administered 2018-11-17: 4 mg via ORAL
  Filled 2018-11-17: qty 1

## 2018-11-17 NOTE — Progress Notes (Signed)
Physical Therapy Treatment Patient Details Name: Wendy Collier MRN: [id number] DOB: February 28, 1966 Today's Date: 11/17/2018    History of Present Illness Shahidah Hanif is a 53 y.o. female with PMH migraines, fibromyalgia, psychiatric disorder, "bad knees" presented to [facility] ED with 1 day hx of HA, confusion followed by reduced vision on the right side and RLE weakness. CT: moderate sized, approximately 30 cc hemorrhage in the left posterior medial parietal as well as occipital lobe. NIH:5.    PT Comments    Patient progressing slowly towards PT goals. Continues to demonstrate right inattention, incoordination and decreased proprioception. Requires cues to use and attend to right side during functional tasks at the sink. Requires Min A for standing and transfers. Tolerated gait training today with Mod A for balance/safety and cues for RLE placement and to widen BoS. Pt with poor awareness into deficits. Pt with nausea and an episode of emesis during session- noted to brady down to 56 bpm with BP 176/90, diaphoretic as well. Rn aware. Pt also with poor awareness of safety as she wants to go home despite needing lots of assist for mobility. Interested in learning more about her rehab options. Messaged rehab coordinator Menan. Would be a great CIR candidate. Will follow.   Follow Up Recommendations  CIR;Supervision for mobility/OOB;Supervision/Assistance - 24 hour     Equipment Recommendations  Other (comment)(defer)    Recommendations for Other Services       Precautions / Restrictions Precautions Precautions: Fall Precaution Comments: SBP <140 Restrictions Weight Bearing Restrictions: No    Mobility  Bed Mobility Overal bed mobility: Needs Assistance Bed Mobility: Supine to Sit     Supine to sit: Min guard;HOB elevated     General bed mobility comments: Able to get to EOB without assist; some difficulty with RLE but able to move it without cues today.  Transfers Overall  transfer level: Needs assistance Equipment used: 1 person hand held assist Transfers: Sit to/from [employer] Sit to Stand: Min assist Stand pivot transfers: Min assist       General transfer comment: Assist to power to standing with difficulty at mid point toget fully upright with decreased WB through RLE. Stood from [employer], from [facility] x1, from chair x1. SPT bed to [facility] and [facility] to chair with Min A. Favors left side when standing/sitting down despite cues to WB through right side.  Ambulation/Gait Ambulation/Gait assistance: Mod assist Gait Distance (Feet): 60 Feet Assistive device: 1 person hand held assist Gait Pattern/deviations: Step-through pattern;Ataxic;Decreased step length - left;Decreased stance time - right;Narrow base of support Gait velocity: decreased   General Gait Details: Slow, deliberate and ataxic like gait with narrow BoS and difficulty controlling placement of RLE using visual cues and therapist's foot. Cues to wide Bos. Used vision to help with LE advancement.    Stairs             Wheelchair Mobility    Modified Rankin (Stroke Patients Only) Modified Rankin (Stroke Patients Only) Pre-Morbid Rankin Score: No symptoms Modified Rankin: Moderately severe disability     Balance Overall balance assessment: Needs assistance Sitting-balance support: Feet supported;No upper extremity supported Sitting balance-Leahy Scale: Fair Sitting balance - Comments: Able to perform pericare with Min guard assist sitting down.    Standing balance support: Single extremity supported Standing balance-Leahy Scale: Poor Standing balance comment: Able to stand at sink and perform ADLs- brushing teeth, washing face, etc No awareness of RLE with leaning noted requiring Min A-Min guard for safety. max  cues to use RUE for functional tasks.                             Cognition Arousal/Alertness: Awake/alert Behavior During Therapy: WFL for tasks  assessed/performed Overall Cognitive Status: Impaired/Different from baseline Area of Impairment: Awareness;Safety/judgement;Problem solving                     Memory: Decreased short-term memory   Safety/Judgement: Decreased awareness of safety;Decreased awareness of deficits Awareness: Emergent Problem Solving: Difficulty sequencing;Requires verbal cues General Comments: Pt with poor awareness of deficits/safety; right inattention noted.       Exercises      General Comments General comments (skin integrity, edema, etc.): Pt with episode of emesis post ambulation; noted to brady down to 56 bpm, normally in 80s bpm. BP 176/90 post session. Rn aware and brought meds for nausea.       Pertinent Vitals/Pain Pain Assessment: No/denies pain    Home Living                      Prior Function            PT Goals (current goals can now be found in the care plan section) Progress towards PT goals: Progressing toward goals    Frequency    Min 4X/week      PT Plan Current plan remains appropriate    Co-evaluation PT/OT/SLP Co-Evaluation/Treatment: Yes Reason for Co-Treatment: To address functional/ADL transfers;For patient/therapist safety PT goals addressed during session: Mobility/safety with mobility;Balance;Strengthening/ROM        AM-PAC PT "6 Clicks" Mobility   Outcome Measure  Help needed turning from your back to your side while in a flat bed without using bedrails?: A Little Help needed moving from lying on your back to sitting on the side of a flat bed without using bedrails?: A Little Help needed moving to and from a bed to a chair (including a wheelchair)?: A Little Help needed standing up from a chair using your arms (e.g., wheelchair or bedside chair)?: A Little Help needed to walk in hospital room?: A Lot Help needed climbing 3-5 steps with a railing? : Total 6 Click Score: 15    End of Session Equipment Utilized During Treatment:  Gait belt Activity Tolerance: Treatment limited secondary to medical complications (Comment)(nausea, emesis) Patient left: in chair;with call bell/phone within reach;with chair alarm set Nurse Communication: Mobility status PT Visit Diagnosis: Difficulty in walking, not elsewhere classified (R26.2);Other abnormalities of gait and mobility (R26.89);Unsteadiness on feet (R26.81);Ataxic gait (R26.0)     Time: 2952-84131008-1047 PT Time Calculation (min) (ACUTE ONLY): 39 min  Charges:  $Gait Training: 8-22 mins $Therapeutic Activity: 8-22 mins                     Mylo RedShauna Farida Mcreynolds, [place]PT, DPT Acute Rehabilitation Services Pager [phone] Office [phone]       Blake DivineShauna A Lanier EnsignHartshorne 11/17/2018, 11:43 AM

## 2018-11-17 NOTE — H&P (Signed)
sw    Physical Medicine and Rehabilitation Admission H&P    Chief Complaint  Patient presents with  .     HPI: Wendy Collier is a 53 year old female with history of chronic pain, chronic fatigue, fibromyalgia, bipolar disorder with anxiety and depression; who was admitted on 11/14/2018 with 1 day history of headaches and confusion followed by visual changes on right and right lower extremity weakness progressing to right upper extremity weakness.  UDS positive for amphetamines and benzodiazepines.  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  No aneurysm or vascular malformation noted.  MRV of brain was negative for dural venous sinus thrombosis and no change in left parietal lobe noted.  Recommendations to repeat follow-up MRI brain with contrast in 4 to 6 weeks.  She was started on Cardene drip to keep SBP less than 150.  She reported drinking energy drinks daily.  Neurology felt that intraparenchymal hemorrhage likely due to excessive caffeine and sympathomimetics--Monster drinks while on Adderall.  Her headaches are improving and patient reported history of hypertension however had stopped taking medications for a few days--SBP goal <160 per neurology. Patient with resultant mild right-sided weakness with sensory deficits and hemianopsia affecting mobility and ADLs.  CIR recommended due to functional deficits.   Review of Systems  Constitutional: Positive for malaise/fatigue. Negative for chills and fever.  HENT: Negative for congestion and hearing loss.   Eyes: Positive for blurred vision (wears glasses). Negative for double vision.       Aware of right visual field deficits.   Respiratory: Negative for cough, shortness of breath and stridor.   Cardiovascular: Negative for chest pain, palpitations and leg swelling.  Gastrointestinal: Positive for  constipation, nausea and vomiting. Negative for abdominal pain and heartburn.  Genitourinary: Negative for dysuria and urgency.  Musculoskeletal: Positive for myalgias and neck pain. Negative for joint pain.  Skin: Negative for itching and rash.  Neurological: Positive for sensory change, focal weakness and headaches (worse today after activity).  Psychiatric/Behavioral: Negative for depression and memory loss. The patient is nervous/anxious.       Past Medical History:  Diagnosis Date  . Anxiety disorder    with panic attacks.   . Arthritis   . Bipolar 1 disorder (HCC)   . Chronic fatigue   . Chronic pain    neck/back with spasms.   . Depression   . Fibromyalgia   . Frequent headaches   . Thyroid disease    Hypothyroidism     Past Surgical History:  Procedure Laterality Date  . ANTERIOR CERVICAL DECOMP/DISCECTOMY FUSION    . BACK SURGERY    . KNEE SURGERY      Family History  Problem Relation Age of Onset  . Alcohol abuse Mother   . COPD Mother   . Depression Mother   . Drug abuse Mother   . Early death Mother   . Mental illness Mother   . Alcohol abuse Father   . Cancer Father        kidney  . Mental illness Father   . Mental illness Brother        schizophrenia    Social History:  reports that she has never smoked. She has never used smokeless tobacco. She reports that she does not drink alcohol or use drugs.    Allergies  Allergen Reactions  . Tegaderm Ag Mesh [Silver]  Medications Prior to Admission  Medication Sig Dispense Refill  . ALPRAZolam (XANAX) 0.5 MG tablet Take 0.5 mg by mouth 3 (three) times daily as needed for anxiety.    Marland Kitchen amphetamine-dextroamphetamine (ADDERALL) 10 MG tablet Take 10 mg by mouth 2 (two) times daily with a meal.    . Ascorbic Acid (VITAMIN C) 100 MG tablet Take 100 mg by mouth daily.    . Biotin 1 MG CAPS Take 1 mg by mouth daily.    . Brexpiprazole (REXULTI) 2 MG TABS Take 1 tablet by mouth daily.     . celecoxib  (CELEBREX) 200 MG capsule Take 200 mg by mouth daily.    . cholecalciferol (VITAMIN D) 1000 units tablet Take 1,000 Units by mouth daily.    . cloNIDine (CATAPRES) 0.1 MG tablet Take 0.1 mg by mouth daily.     . DULoxetine (CYMBALTA) 20 MG capsule Take 40 mg by mouth daily. With 30 mg capsule daily    . DULoxetine (CYMBALTA) 30 MG capsule Take 30 mg by mouth daily. With two 20 mg capsules    . HYDROcodone-acetaminophen (NORCO/VICODIN) 5-325 MG tablet Take 1 tablet by mouth every 6 (six) hours as needed for moderate pain.     Marland Kitchen levothyroxine (SYNTHROID, LEVOTHROID) 50 MCG tablet Take 50 mcg by mouth daily before breakfast.    . mirtazapine (REMERON) 45 MG tablet Take 45 mg by mouth at bedtime.    . Multiple Vitamins-Minerals (MULTIVITAMIN WITH MINERALS) tablet Take 1 tablet by mouth daily.    . Omega-3 Fatty Acids (FISH OIL) 1000 MG CAPS Take 2,000 mg by mouth daily.    . Pitavastatin Calcium 2 MG TABS Take 2 mg by mouth at bedtime.    . selenium 50 MCG TABS tablet Take 50 mcg by mouth daily.    Marland Kitchen tiZANidine (ZANAFLEX) 4 MG tablet Take 4 mg by mouth at bedtime.     Marland Kitchen zolpidem (AMBIEN) 10 MG tablet Take 10 mg by mouth at bedtime as needed for sleep.      Drug Regimen Review  Drug regimen was reviewed and remains appropriate with no significant issues identified  Home: Home Living Family/patient expects to be discharged to:: Private residence Living Arrangements: Spouse/significant other Available Help at Discharge: Family, Available 24 hours/day Type of Home: House Home Access: Stairs to enter [employer] of Steps: 2 Home Layout: One level Bathroom Shower/Tub: Tub/shower unit, Engineer, building services: Standard Home Equipment: Environmental consultant - 2 wheels, [employer] - single point, Grab bars - tub/shower  Lives With: Spouse   Functional History: Prior Function Level of Independence: Independent Comments: Loves to do arts and crafts, painting; was sewing surgical masks for people PTA   Functional Status:  Mobility: Bed Mobility Overal bed mobility: Needs Assistance Bed Mobility: Supine to Sit Supine to sit: Min guard, HOB elevated General bed mobility comments: coming out on left side of bed, pt reported she needs to watch and bring her RLE with her, but then she kept scooting forward with her RLE bent back beside her and needed cues to reposition. Transfers Overall transfer level: Needs assistance Equipment used: 2 person hand held assist Transfers: Sit to/from Stand Sit to Stand: Mod assist, +2 physical assistance General transfer comment: Assist of 2 to power to standing with difficulty at the mid point to transitionto get fully upright; BLEs locked out into extension. Transferred to chair post ambulation. Ambulation/Gait Ambulation/Gait assistance: Max assist, +2 physical assistance Gait Distance (Feet): 8 Feet Assistive device: 2 person hand held  assist Gait Pattern/deviations: Step-through pattern, Ataxic, Decreased step length - left, Decreased stance time - right, Narrow base of support General Gait Details: Slow, deliberate and ataxic like gait with narrow BoS and difficulty controlling placement of RLE using visual cues and therapist's foot. Cues to wide Bos. Used vision to help with LE advancement. right knee instability noted. Blaming poor balance/walking on "bad knees." Gait velocity: decreased Gait velocity interpretation: <1.31 ft/sec, indicative of household ambulator    ADL: ADL Overall ADL's : Needs assistance/impaired Eating/Feeding: Set up, Sitting Grooming: Minimal assistance, Sitting Upper Body Bathing: Minimal assistance, Sitting Lower Body Bathing: Moderate assistance Lower Body Bathing Details (indicate cue type and reason): Mod A +2 sit<>stand Upper Body Dressing : Minimal assistance, Sitting Lower Body Dressing: Moderate assistance Lower Body Dressing Details (indicate cue type and reason): Mod A +2 sit<>stand Toilet Transfer: Moderate  assistance, +2 for physical assistance, Ambulation Toilet Transfer Details (indicate cue type and reason): Bil HHA Toileting- Clothing Manipulation and Hygiene: Moderate assistance Toileting - Clothing Manipulation Details (indicate cue type and reason): Mod A +2 sit<>stand  Cognition: Cognition Overall Cognitive Status: Impaired/Different from baseline Arousal/Alertness: Awake/alert Orientation Level: Oriented X4 Attention: Sustained Sustained Attention: Impaired Sustained Attention Impairment: Verbal complex, Functional complex Memory: Impaired Memory Impairment: Retrieval deficit, Decreased recall of new information, Decreased short term memory Decreased Short Term Memory: Verbal basic, Functional basic Awareness: Appears intact Problem Solving: Appears intact Executive Function: Landscape architect: Impaired Organizing Impairment: Verbal basic, Functional basic Safety/Judgment: Impaired Cognition Arousal/Alertness: Awake/alert Behavior During Therapy: WFL for tasks assessed/performed Overall Cognitive Status: Impaired/Different from baseline Area of Impairment: Attention, Memory, Safety/judgement, Awareness, Problem solving Current Attention Level: Selective Memory: Decreased short-term memory Safety/Judgement: Decreased awareness of safety, Decreased awareness of deficits Awareness: Emergent Problem Solving: Difficulty sequencing, Requires verbal cues General Comments: Pt also had trouble answering some of our household questions; aware of right side deficits but does not always attend to right side. Some right inattention.    Blood pressure 131/82, pulse 74, temperature 98.6 F (37 C), temperature source Oral, resp. rate 16, height 5\' 6"  (1.676 m), weight 74.6 kg, last menstrual period 08/28/2016, SpO2 100 %. Physical Exam  Nursing note and vitals reviewed. Constitutional: She is oriented to person, place, and time. She appears well-developed and well-nourished.  Lying  in bed, mildly anxious but alert and appropriate.   HENT:  Head: Normocephalic.  Eyes: Pupils are equal, round, and reactive to light.  Cardiovascular: Normal rate and regular rhythm.  Respiratory: No stridor. No respiratory distress. She has no wheezes.  GI: Soft. She exhibits no distension. There is no abdominal tenderness.  Neurological: She is alert and oriented to person, place, and time.  Speech clear and spontaneous. She was able to follow simple one and two step commands without difficulty. Right HH with few beats nystagmus right field lateral field. RLE with sensory deficits and emerging tone. RUE>RLE ataxia.   Skin: Skin is warm.  Psychiatric: She has a normal mood and affect.  anxious    Results for orders placed or performed during the hospital encounter of 11/14/18 (from the past 48 hour(s))  Triglycerides     Status: Abnormal   Collection Time: 11/16/18  2:10 AM  Result Value Ref Range   Triglycerides 162 (H) <150 mg/dL    Comment: Performed at [facility], [address] [place], [place] [address]   No results found.     Medical Problem List and Plan: 1.  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  Antithrombotics: -DVT/anticoagulation:  Mechanical: Sequential compression devices, below knee Bilateral lower extremities  -antiplatelet therapy: N/A 3.  Chronic pain pain Management: Continue tizanidine at bedtime with tramadol as needed 4. Mood: LCSW to follow for evaluation and support  -antipsychotic agents: On Rexulti. 5. Neuropsych: This patient is capable of making decisions on her own behalf. 6. Skin/Wound Care: Routine pressure relief measures. 7. Fluids/Electrolytes/Nutrition: Monitor I's and O's.  Check follow-up CBC/lytes in a.m. 8.  HTN: Poorly controlled per records monitor blood pressures 3 times daily.  Continue Lotensin and metoprolol. 9.  Bipolar disorder/panic attacks: Continue  Cymbalta, Rexulti. Xanax 0.5 mg tid prn and ambien 10 mg HS PTA.  10.  Nausea/vomiting: Will schedule Zofran for now.  Continue Protonix. 11. ADHD/Chronic fatigue: Was on Adderall bid PTA.       Jacquelynn Cree, PA-C 11/17/2018

## 2018-11-17 NOTE — Progress Notes (Addendum)
Occupational Therapy Treatment Patient Details Name: Janilee Pegan MRN: [id number] DOB: 1966-03-02 Today's Date: 11/17/2018    History of present illness Jenisse Seibold is a 53 y.o. female with PMH migraines, fibromyalgia, psychiatric disorder, "bad knees" presented to [facility] ED with 1 day hx of HA, confusion followed by reduced vision on the right side and RLE weakness. CT: moderate sized, approximately 30 cc hemorrhage in the left posterior medial parietal as well as occipital lobe. NIH:5.   OT comments  Patient progressing slowly.  She is able to complete toilet transfers to 3:1 commode, toielting and grooming standing at sink with min assist.  Cueing throughout session to utilize/attend to R UE during functional tasks (using R UE as gross stabilizer during fine motor tasks), and to increase awareness of R LE placement.  Noted favoring L LE when standing statically at sink, but able to reposition with cueing. Pt with poor awareness to deficits.  Nauseated throughout session, with emesis after mobility (brady down to 56 and BP 176/90) with RN aware.  Discussed continued recommendations for CIR, benefits and options.  Will follow.    Follow Up Recommendations  CIR;Supervision/Assistance - 24 hour    Equipment Recommendations  Other (comment)(TBD)    Recommendations for Other Services      Precautions / Restrictions Precautions Precautions: Fall Precaution Comments: SBP <140 Restrictions Weight Bearing Restrictions: No       Mobility Bed Mobility Overal bed mobility: Needs Assistance Bed Mobility: Supine to Sit     Supine to sit: Min guard;HOB elevated     General bed mobility comments: Able to get to EOB without assist  Transfers Overall transfer level: Needs assistance Equipment used: 1 person hand held assist Transfers: Sit to/from [employer] Sit to Stand: Min assist Stand pivot transfers: Min assist       General transfer comment: cueing for hand  placement and safety (esp R side), min assist to power up into standing and poor awareness of R UE/LE once standing     Balance Overall balance assessment: Needs assistance Sitting-balance support: Feet supported;No upper extremity supported Sitting balance-Leahy Scale: Fair Sitting balance - Comments: Able to perform pericare with Min guard assist sitting down.    Standing balance support: Single extremity supported Standing balance-Leahy Scale: Poor Standing balance comment: standing at sink during grooming, preference to at least 1  UE support                            ADL either performed or assessed with clinical judgement   ADL Overall ADL's : Needs assistance/impaired     Grooming: Minimal assistance;Standing;Wash/dry hands;Wash/dry face;Oral care Grooming Details (indicate cue type and reason): min assist for standing balance at times, cueing to utilize R UE during functional tasks (as gross stabilizer for [facility])                 Toilet Transfer: Minimal assistance;Stand-pivot;BSC   Toileting- Clothing Manipulation and Hygiene: Minimal assistance;Sit to/from stand Toileting - Clothing Manipulation Details (indicate cue type and reason): min assist to maintain balance in standing        General ADL Comments: continues to be limited by R inattention, coordination and balance     Vision       Perception     Praxis      Cognition Arousal/Alertness: Awake/alert Behavior During Therapy: WFL for tasks assessed/performed Overall Cognitive Status: Impaired/Different from baseline Area of Impairment: Awareness;Safety/judgement;Problem solving  Memory: Decreased short-term memory   Safety/Judgement: Decreased awareness of safety;Decreased awareness of deficits Awareness: Emergent Problem Solving: Difficulty sequencing;Requires verbal cues General Comments: continues to require cueing for safety awareness and awareness of  deficits; R inattention note        Exercises     Shoulder Instructions       General Comments episode of emesis after mobility, noted brady down to 56 bpm; BP 176/90 after mobility; RN aware and present at end of session    Pertinent Vitals/ Pain       Pain Assessment: No/denies pain  Home Living                                          Prior Functioning/Environment              Frequency  Min 3X/week        Progress Toward Goals  OT Goals(current goals can now be found in the care plan section)  Progress towards OT goals: Progressing toward goals  Acute Rehab OT Goals Patient Stated Goal: really wants to go home, but does not want her husband to have to do as much as he would have to if she did go home now--so is open to rehab. OT Goal Formulation: With patient Time For Goal Achievement: 11/30/18 Potential to Achieve Goals: Good  Plan Discharge plan remains appropriate;Frequency remains appropriate    Co-evaluation    PT/OT/SLP Co-Evaluation/Treatment: Yes Reason for Co-Treatment: For patient/therapist safety;To address functional/ADL transfers PT goals addressed during session: Mobility/safety with mobility;Balance;Strengthening/ROM OT goals addressed during session: ADL's and self-care      AM-PAC OT "6 Clicks" Daily Activity     Outcome Measure   Help from another person eating meals?: A Little Help from another person taking care of personal grooming?: A Little Help from another person toileting, which includes using toliet, bedpan, or urinal?: A Lot Help from another person bathing (including washing, rinsing, drying)?: A Lot Help from another person to put on and taking off regular upper body clothing?: A Little Help from another person to put on and taking off regular lower body clothing?: A Lot 6 Click Score: 15    End of Session Equipment Utilized During Treatment: Gait belt  OT Visit Diagnosis: Unsteadiness on feet  (R26.81);Other abnormalities of gait and mobility (R26.89);Low vision, both eyes (H54.2);Other symptoms and signs involving cognitive function;Hemiplegia and hemiparesis;Pain Hemiplegia - Right/Left: Right Hemiplegia - dominant/non-dominant: Dominant Hemiplegia - caused by: Nontraumatic intracerebral hemorrhage   Activity Tolerance Patient tolerated treatment well   Patient Left in chair;with call bell/phone within reach;with chair alarm set   Nurse Communication Mobility status        Time: [id number] OT Time Calculation (min): 39 min  Charges: OT General Charges $OT Visit: 1 Visit OT Treatments $Self Care/Home Management : 8-22 mins  Chancy Milroyhristie S Jeryn Bertoni, OT Acute Rehabilitation Services Pager [phone] Office [phone]    Chancy MilroyChristie S Filimon Miranda 11/17/2018, 1:19 PM

## 2018-11-17 NOTE — H&P (Signed)
Physical Medicine and Rehabilitation Admission H&P        Chief Complaint  Patient presents with  .        HPI: Wendy Collier is a 53 year old female with history of chronic pain, chronic fatigue, fibromyalgia, bipolar disorder with anxiety and depression; who was admitted on 11/14/2018 with 1 day history of headaches and confusion followed by visual changes on right and right lower extremity weakness progressing to right upper extremity weakness.  UDS positive for amphetamines and benzodiazepines.  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  No aneurysm or vascular malformation noted.  MRV of brain was negative for dural venous sinus thrombosis and no change in left parietal lobe noted.  Recommendations to repeat follow-up MRI brain with contrast in 4 to 6 weeks.  She was started on Cardene drip to keep SBP less than 150.  She reported drinking energy drinks daily.  Neurology felt that intraparenchymal hemorrhage likely due to excessive caffeine and sympathomimetics--Monster drinks while on Adderall.  Her headaches are improving and patient reported history of hypertension however had stopped taking medications for a few days--SBP goal <160 per neurology. Patient with resultant mild right-sided weakness with sensory deficits and hemianopsia affecting mobility and ADLs.  CIR recommended due to functional deficits.     Review of Systems  Constitutional: Positive for malaise/fatigue. Negative for chills and fever.  HENT: Negative for congestion and hearing loss.   Eyes: Positive for blurred vision (wears glasses). Negative for double vision.       Aware of right visual field deficits.   Respiratory: Negative for cough, shortness of breath and stridor.   Cardiovascular: Negative for chest pain, palpitations and leg swelling.  Gastrointestinal: Positive  for constipation, nausea and vomiting. Negative for abdominal pain and heartburn.  Genitourinary: Negative for dysuria and urgency.  Musculoskeletal: Positive for myalgias and neck pain. Negative for joint pain.  Skin: Negative for itching and rash.  Neurological: Positive for sensory change, focal weakness and headaches (worse today after activity).  Psychiatric/Behavioral: Negative for depression and memory loss. The patient is nervous/anxious.             Past Medical History:  Diagnosis Date  . Anxiety disorder      with panic attacks.   . Arthritis    . Bipolar 1 disorder (HCC)    . Chronic fatigue    . Chronic pain      neck/back with spasms.   . Depression    . Fibromyalgia    . Frequent headaches    . Thyroid disease      Hypothyroidism       Past Surgical History:  Procedure Laterality Date  . ANTERIOR CERVICAL DECOMP/DISCECTOMY FUSION      . BACK SURGERY      . KNEE SURGERY               Family History  Problem Relation Age of Onset  . Alcohol abuse Mother    . COPD Mother    . Depression Mother    . Drug abuse Mother    . Early death Mother    . Mental illness Mother    . Alcohol abuse Father    . Cancer Father          kidney  . Mental illness Father    . Mental  illness Brother          schizophrenia      Social History:  reports that she has never smoked. She has never used smokeless tobacco. She reports that she does not drink alcohol or use drugs.      Allergies  Allergen Reactions  . Tegaderm Ag Mesh [Silver]            Medications Prior to Admission  Medication Sig Dispense Refill  . ALPRAZolam (XANAX) 0.5 MG tablet Take 0.5 mg by mouth 3 (three) times daily as needed for anxiety.      Marland Kitchen amphetamine-dextroamphetamine (ADDERALL) 10 MG tablet Take 10 mg by mouth 2 (two) times daily with a meal.      . Ascorbic Acid (VITAMIN C) 100 MG tablet Take 100 mg by mouth daily.      . Biotin 1 MG CAPS Take 1 mg by mouth daily.      . Brexpiprazole  (REXULTI) 2 MG TABS Take 1 tablet by mouth daily.       . celecoxib (CELEBREX) 200 MG capsule Take 200 mg by mouth daily.      . cholecalciferol (VITAMIN D) 1000 units tablet Take 1,000 Units by mouth daily.      . cloNIDine (CATAPRES) 0.1 MG tablet Take 0.1 mg by mouth daily.       . DULoxetine (CYMBALTA) 20 MG capsule Take 40 mg by mouth daily. With 30 mg capsule daily      . DULoxetine (CYMBALTA) 30 MG capsule Take 30 mg by mouth daily. With two 20 mg capsules      . HYDROcodone-acetaminophen (NORCO/VICODIN) 5-325 MG tablet Take 1 tablet by mouth every 6 (six) hours as needed for moderate pain.       Marland Kitchen levothyroxine (SYNTHROID, LEVOTHROID) 50 MCG tablet Take 50 mcg by mouth daily before breakfast.      . mirtazapine (REMERON) 45 MG tablet Take 45 mg by mouth at bedtime.      . Multiple Vitamins-Minerals (MULTIVITAMIN WITH MINERALS) tablet Take 1 tablet by mouth daily.      . Omega-3 Fatty Acids (FISH OIL) 1000 MG CAPS Take 2,000 mg by mouth daily.      . Pitavastatin Calcium 2 MG TABS Take 2 mg by mouth at bedtime.      . selenium 50 MCG TABS tablet Take 50 mcg by mouth daily.      Marland Kitchen tiZANidine (ZANAFLEX) 4 MG tablet Take 4 mg by mouth at bedtime.       Marland Kitchen zolpidem (AMBIEN) 10 MG tablet Take 10 mg by mouth at bedtime as needed for sleep.          Drug Regimen Review  Drug regimen was reviewed and remains appropriate with no significant issues identified   Home: Home Living Family/patient expects to be discharged to:: Private residence Living Arrangements: Spouse/significant other Available Help at Discharge: Family, Available 24 hours/day Type of Home: House Home Access: Stairs to enter [employer] of Steps: 2 Home Layout: One level Bathroom Shower/Tub: Tub/shower unit, Engineer, building services: Standard Home Equipment: Environmental consultant - 2 wheels, Cane - single point, Grab bars - tub/shower  Lives With: Spouse   Functional History: Prior Function Level of Independence:  Independent Comments: Loves to do arts and crafts, painting; was sewing surgical masks for people PTA   Functional Status:  Mobility: Bed Mobility Overal bed mobility: Needs Assistance Bed Mobility: Supine to Sit Supine to sit: Min guard, HOB elevated General bed mobility comments: coming  out on left side of bed, pt reported she needs to watch and bring her RLE with her, but then she kept scooting forward with her RLE bent back beside her and needed cues to reposition. Transfers Overall transfer level: Needs assistance Equipment used: 2 person hand held assist Transfers: Sit to/from Stand Sit to Stand: Mod assist, +2 physical assistance General transfer comment: Assist of 2 to power to standing with difficulty at the mid point to transitionto get fully upright; BLEs locked out into extension. Transferred to chair post ambulation. Ambulation/Gait Ambulation/Gait assistance: Max assist, +2 physical assistance Gait Distance (Feet): 8 Feet Assistive device: 2 person hand held assist Gait Pattern/deviations: Step-through pattern, Ataxic, Decreased step length - left, Decreased stance time - right, Narrow base of support General Gait Details: Slow, deliberate and ataxic like gait with narrow BoS and difficulty controlling placement of RLE using visual cues and therapist's foot. Cues to wide Bos. Used vision to help with LE advancement. right knee instability noted. Blaming poor balance/walking on "bad knees." Gait velocity: decreased Gait velocity interpretation: <1.31 ft/sec, indicative of household ambulator   ADL: ADL Overall ADL's : Needs assistance/impaired Eating/Feeding: Set up, Sitting Grooming: Minimal assistance, Sitting Upper Body Bathing: Minimal assistance, Sitting Lower Body Bathing: Moderate assistance Lower Body Bathing Details (indicate cue type and reason): Mod A +2 sit<>stand Upper Body Dressing : Minimal assistance, Sitting Lower Body Dressing: Moderate assistance  Lower Body Dressing Details (indicate cue type and reason): Mod A +2 sit<>stand Toilet Transfer: Moderate assistance, +2 for physical assistance, Ambulation Toilet Transfer Details (indicate cue type and reason): Bil HHA Toileting- Clothing Manipulation and Hygiene: Moderate assistance Toileting - Clothing Manipulation Details (indicate cue type and reason): Mod A +2 sit<>stand   Cognition: Cognition Overall Cognitive Status: Impaired/Different from baseline Arousal/Alertness: Awake/alert Orientation Level: Oriented X4 Attention: Sustained Sustained Attention: Impaired Sustained Attention Impairment: Verbal complex, Functional complex Memory: Impaired Memory Impairment: Retrieval deficit, Decreased recall of new information, Decreased short term memory Decreased Short Term Memory: Verbal basic, Functional basic Awareness: Appears intact Problem Solving: Appears intact Executive Function: Landscape architect: Impaired Organizing Impairment: Verbal basic, Functional basic Safety/Judgment: Impaired Cognition Arousal/Alertness: Awake/alert Behavior During Therapy: WFL for tasks assessed/performed Overall Cognitive Status: Impaired/Different from baseline Area of Impairment: Attention, Memory, Safety/judgement, Awareness, Problem solving Current Attention Level: Selective Memory: Decreased short-term memory Safety/Judgement: Decreased awareness of safety, Decreased awareness of deficits Awareness: Emergent Problem Solving: Difficulty sequencing, Requires verbal cues General Comments: Pt also had trouble answering some of our household questions; aware of right side deficits but does not always attend to right side. Some right inattention.      Blood pressure 131/82, pulse 74, temperature 98.6 F (37 C), temperature source Oral, resp. rate 16, height 5\' 6"  (1.676 m), weight 74.6 kg, last menstrual period 08/28/2016, SpO2 100 %. Physical Exam  Nursing note and vitals reviewed.  Constitutional: She is oriented to person, place, and time. She appears well-developed and well-nourished.  Lying in bed, mildly anxious but alert and appropriate.   HENT:  Head: Normocephalic.  Eyes: Pupils are equal, round, and reactive to light.  Cardiovascular: Normal rate and regular rhythm.  Respiratory: No stridor. No respiratory distress. She has no wheezes.  GI: Soft. She exhibits no distension. There is no abdominal tenderness.  Neurological: She is alert and oriented to person, place, and time.  Speech clear and spontaneous. She was able to follow simple one and two step commands without difficulty. Right HH with few beats nystagmus right field lateral field.  RLE with sensory deficits and emerging tone. RUE>RLE ataxia.   Skin: Skin is warm.  Psychiatric: She has a normal mood and affect.  anxious      Lab Results Last 48 Hours        Results for orders placed or performed during the hospital encounter of 11/14/18 (from the past 48 hour(s))  Triglycerides     Status: Abnormal    Collection Time: 11/16/18  2:10 AM  Result Value Ref Range    Triglycerides 162 (H) <150 mg/dL      Comment: Performed at [facility], [address] [place], [place] [address]      Imaging Results (Last 48 hours)  No results found.           Medical Problem List and Plan: 1.  Functional deficits secondary to left parietal intraparenchymal hemorrhage             -admit to inpatient rehab 2.  Antithrombotics: -DVT/anticoagulation:  Mechanical: Sequential compression devices, below knee Bilateral lower extremities             -antiplatelet therapy: N/A 3.  Chronic pain pain Management: Continue tizanidine at bedtime with tramadol as needed 4. Mood: LCSW to follow for evaluation and support             -antipsychotic agents: On Rexulti. 5. Neuropsych: This patient is capable of making decisions on her own behalf. 6. Skin/Wound Care: Routine pressure relief measures. 7.  Fluids/Electrolytes/Nutrition: Monitor I's and O's.  Check follow-up CBC/lytes in a.m. 8.  HTN: Poorly controlled per records monitor blood pressures 3 times daily.  Continue Lotensin and metoprolol. 9.  Bipolar disorder/panic attacks: Continue Cymbalta, Rexulti. Xanax 0.5 mg tid prn and ambien 10 mg HS PTA.  10.  Nausea/vomiting: Will schedule Zofran for now.  Continue Protonix. 11. ADHD/Chronic fatigue: Was on Adderall bid PTA.    Post Admission Physician Evaluation: 1. Functional deficits secondary  to left IPH. 2. Patient is admitted to receive collaborative, interdisciplinary care between the physiatrist, rehab nursing staff, and therapy team. 3. Patient's level of medical complexity and substantial therapy needs in context of that medical necessity cannot be provided at a lesser intensity of care such as a SNF. 4. Patient has experienced substantial functional loss from his/her baseline which was documented above under the "Functional History" and "Functional Status" headings.  Judging by the patient's diagnosis, physical exam, and functional history, the patient has potential for functional progress which will result in measurable gains while on inpatient rehab.  These gains will be of substantial and practical use upon discharge  in facilitating mobility and self-care at the household level. 5. Physiatrist will provide 24 hour management of medical needs as well as oversight of the therapy plan/treatment and provide guidance as appropriate regarding the interaction of the two. 6. The Preadmission Screening has been reviewed and patient status is unchanged unless otherwise stated above. 7. 24 hour rehab nursing will assist with bladder management, bowel management, safety, skin/wound care, disease management, medication administration, pain management and patient education  and help integrate therapy concepts, techniques,education, etc. 8. PT will assess and treat for/with: Lower extremity  strength, range of motion, stamina, balance, functional mobility, safety, adaptive techniques and equipment, NMR, visual-spatial deficits.   Goals are: supervision. 9. OT will assess and treat for/with: ADL's, functional mobility, safety, upper extremity strength, adaptive techniques and equipment .   Goals are: supervision. Therapy may proceed with showering this patient. 10. SLP will assess and  treat for/with: cognition.  Goals are: mod I. 11. Case Management and Social Worker will assess and treat for psychological issues and discharge planning. 12. Team conference will be held weekly to assess progress toward goals and to determine barriers to discharge. 13. Patient will receive at least 3 hours of therapy per day at least 5 days per week. 14. ELOS: 7-10 days       15. Prognosis:  excellent   I have personally  formulated the key components of the plan.  Additionally, I have personally reviewed laboratory data, imaging studies, as well as relevant notes and concur with the physician assistant's documentation above.  Ranelle Oyster, MD, [place] [facility]       Jacquelynn Cree, PA-C 11/17/2018

## 2018-11-17 NOTE — Progress Notes (Signed)
Rehab Admissions Coordinator Note:  Patient was screened by Nanine Means for appropriateness for an Inpatient Acute Rehab Consult.  At this time, we are recommending Inpatient Rehab consult. AC will contact MD to request an order.  Nanine Means 11/17/2018, 7:47 AM  I can be reached at [phone].

## 2018-11-17 NOTE — Progress Notes (Signed)
Inpatient Rehabilitation-Admissions Coordinator   Inpatient Rehab Consult received.  I met with pt at the bedside for rehabilitation assessment and to discuss goals and expectations of an inpatient rehab admission. Pt is agreeable to CIR at this time. AC has confirmed assist at DC if needed.   AC has received medical clearance and will admit pt to CIR today.   Please call if questions.   Jhonnie Garner, OTR/L  Rehab Admissions Coordinator  [phone] 11/17/2018 2:38 PM

## 2018-11-17 NOTE — Progress Notes (Signed)
STROKE TEAM PROGRESS NOTE   HISTORY OF PRESENT ILLNESS (per record) Wendy Collier is a 53 y.o. female with past medical history of migraines, fibromyalgia, psychiatric disorder presented to [facility] emergency department with 1 day history of headache, confusion followed by reduced vision on the right side and right leg weakness.  She states that her headache started yesterday and she thought this was due to her migraines.  She then noticed today that she had weakness in her right leg as well as loss of control over her right hand.  She also felt that she has not been seeing as well and decided to present to the emergency room.  Her blood pressure at [facility]  was 138 x 87 mmHg, however subsequently increased to about 150 systolic.  CT head was obtained which showed a moderate sized, approximately 30 cc hemorrhage in the left posterior medial parietal  as well as occipital lobe.  Neurology at [facility] was consulted and a CTA head as well as the MR brain and MR venogram was performed prior to transfer. No dural venous sinus thrombosis was noted on MR venogram.  CTA showed no evidence of aneurysm or AVM.  There was some subdural extension of the hematoma.  On arrival patient is awake and following commands.  Blood pressure slightly low 140 systolic and therefore Cardene drip was started.  The patient denies any history of drug abuse. Does state that she drinks "Monsters" caffeinated drinks infrequently needs to having 1 yesterday.   Date last known well:  Time last known well:  tPA Given:  NIHSS: 5 Baseline MRS 0   SUBJECTIVE (INTERVAL HISTORY) Sitting in a chair.. Improved strength. Still difficulty with coordination on the right arm and leg. Still with headache but better. Today she states that she has had high blood pressure at home and was on medications in the past but stopped. The day of the bleed she also had a monster drink and Dr. Alcus Dad. She also takes Adderall. No trauma, no  inciting events, she wasn't feeling well for a few days so she did not take any of her medications for several days and she is on clonidine. Mess patient takes regularly include cymbalta, synthroid, tizanidine, brexpiprazole.     OBJECTIVE Vitals:   11/17/18 0024 11/17/18 0420 11/17/18 0832 11/17/18 1212  BP: 129/76 139/69 131/82 123/84  Pulse: (!) 59 69 74 84  Resp:   16 18  Temp: 98.2 F (36.8 C) 98 F (36.7 C) 98.6 F (37 C) 98.2 F (36.8 C)  TempSrc: Oral Oral Oral Oral  SpO2: 100% 100% 100% 100%  Weight:      Height:        CBC:  Recent Labs  Lab 11/14/18 1450  WBC 10.5  NEUTROABS 7.2  HGB 13.9  HCT 41.1  MCV 88.0  PLT 260    Basic Metabolic Panel:  Recent Labs  Lab 11/14/18 1450  NA 141  K 4.0  CL 105  CO2 24  GLUCOSE 101*  BUN 20  CREATININE 0.80  CALCIUM 9.7    Lipid Panel:     Component Value Date/Time   TRIG 162 (H) 11/16/2018 0210   HgbA1c: No results found for: HGBA1C Urine Drug Screen:     Component Value Date/Time   LABOPIA NONE DETECTED 11/14/2018 1755   COCAINSCRNUR NONE DETECTED 11/14/2018 1755   LABBENZ POSITIVE (A) 11/14/2018 1755   AMPHETMU POSITIVE (A) 11/14/2018 1755   THCU NONE DETECTED 11/14/2018 1755   LABBARB NONE  DETECTED 11/14/2018 1755    Alcohol Level No results found for: ETH  IMAGING  Ct Angio Head W Or Wo Contrast 11/14/2018 IMPRESSION:  1. Lobar intraparenchymal hemorrhage within the left parietal lobe with volume of 33 mL. Moderate edema with mild mass effect on the left occipital lobe.  2. Small amount of subdural extension of hematoma along the posterior falx cerebri.  3. Normal CTA of the intracranial arteries. No aneurysm or vascular malformation.   Mr [name] And Wo Contrast Mr Mrv Head Wo Cm 11/14/2018 IMPRESSION:  1. Unchanged appearance of intraparenchymal hematoma within the left parietal lobe with mild associated mass effect. No abnormal contrast enhancement.  2. No dural venous sinus thrombosis.   3. The etiology of the hemorrhage remains unclear. Follow up MRI with and without contrast with susceptibility-weighted imaging might be helpful in 4-6 weeks.   EKG - SR rate 80 BPM. (See cardiology reading for complete details)   PHYSICAL EXAM Blood pressure 123/84, pulse 84, temperature 98.2 F (36.8 C), temperature source Oral, resp. rate 18, height 5\' 6"  (1.676 m), weight 74.6 kg, last menstrual period 08/28/2016, SpO2 100 %.   Physical exam: Exam: Gen: NAD, conversant, well nourised, obese, well groomed                     CV: RRR, no MRG. No Carotid Bruits. No peripheral edema, warm, nontender Eyes: Conjunctivae clear without exudates or hemorrhage  Neuro: Detailed Neurologic Exam  Speech:    Speech is normal; fluent and spontaneous with normal comprehension.  Cognition:    The patient is oriented to person, place, and time;     recent and remote memory intact;     language fluent;     normal attention, concentration,     fund of knowledge Cranial Nerves:    The pupils are equal, round, and reactive to light.right hemianopia.  Extraocular movements are intact. Trigeminal sensation is intact and the muscles of mastication are normal. The face is symmetric. The palate elevates in the midline. Hearing intact. Voice is normal. Shoulder shrug is normal. The tongue has normal motion without fasciculations.   Coordination:    Ataxia right arm and leg  Motor Observation:    No asymmetry, no atrophy, and no involuntary movements noted. Tone:    Normal muscle tone.    Posture:    Posture is normal. normal erect    Strength:    Right mild hemiparesis 5-/5, otherwise intact     Sensation: decreased right arm and leg     Reflex Exam:  DTR's:    Deep tendon reflexes in the upper and lower extremities are normal bilaterally.   Toes:    The toes are downgoing bilaterally.   Clonus:    Clonus is absent.        Ms. Wendy Collier is a 53 y.o. female with history of  migraines, fibromyalgia, bipolar, hypothyroidism, depression, and psychiatric disorder presenting with headache, confusion followed by reduced vision on the right side and right leg weakness. She did not receive IV t-PA due to hemorrhage.  Intraparenchymal hematoma within the left parietal lobe - etiology likely due to untreated hypertension with excessive caffeine and sympathomimetics (had Monster drink, Dr. Alcus DadPeppers, on Adderall).   Resultant  Mild right-sided weakness and sensory deficit, hemianopia, ataxia right arm and leg  CT head - not performed  MR / MRV head - Unchanged appearance of intraparenchymal hematoma within the left parietal lobe with mild associated mass effect.  No abnormal contrast enhancement. No dural venous sinus thrombosis. The etiology of the hemorrhage remains unclear. Follow up MRI with and without contrast with susceptibility-weighted imaging might be helpful in 4-6 weeks.  MRA head - not performed  CTA Head - Lobar intraparenchymal hemorrhage within the left parietal lobe with volume of 33 mL. Moderate edema with mild mass effect on the left occipital lobe. Small amount of subdural extension of hematoma along the posterior falx cerebri.   Carotid Doppler - not indicated  2D Echo - not indicated  [employer] Virus 2 - negative  LDL - not indicated  HgbA1c - not indicated  UDS - + for amphetamines and benzodiazepines (pt has Rxs for both)  VTE prophylaxis - SCDs  Diet - regular  No antithrombotic prior to admission, now on No antithrombotic due to bleed  Ongoing aggressive stroke risk factor management  Therapy recommendations:  CIR, physical medicine consult pending  Disposition:  Pending. Likely Home PT vs CIR  Hypertension  Stable (Cleviprex)  SBP goal < 160 mm Hg . Long-term BP goal normotensive . start metoprolol and lisinopril  Other Stroke Risk Factors  Migraines  Other Active Problems  Psychiatric history   Hospital day #  3  Personally examined patient and images, and have participated in and made any corrections needed to history, physical, neuro exam,assessment and plan as stated above.  I have personally obtained the history, evaluated lab date, reviewed imaging studies and agree with radiology interpretations.      A total of 15 minutes was spent for the care of this patient, spent on counseling patient and family on different diagnostic and therapeutic options, counseling and coordination of care, riskd ans benefits of management, compliance, or risk factor reduction and education.   To contact Stroke Continuity provider, please refer to [email or web address]. After hours, contact General Neurology

## 2018-11-17 NOTE — IPOC Note (Signed)
IPOC unable to be completed as patient left AMA.

## 2018-11-17 NOTE — Progress Notes (Signed)
Transported to CT Scan per orders via bed,

## 2018-11-17 NOTE — Discharge Summary (Signed)
Patient ID: Wendy Collier    l   MRN: [id number]      DOB: 04-Jan-1966  Date of Admission: 11/14/2018 Date of Discharge: 11/17/2018  Attending Physician:  Micki RileySethi, Pramod S, MD, Stroke MD Consultant(s):   None Patient's PCP:  Dettinger, Elige RadonJoshua A, MD  DISCHARGE DIAGNOSIS: Intraparenchymal hematoma within the left parietal lobe Active Problems:   ICH (intracerebral hemorrhage) (HCC)  Past Medical History:  Diagnosis Date  . Anxiety disorder    with panic attacks.   . Arthritis   . Bipolar 1 disorder (HCC)   . Chronic fatigue   . Chronic pain    neck/back with spasms.   . Depression   . Fibromyalgia   . Frequent headaches   . Thyroid disease    Hypothyroidism    Past Surgical History:  Procedure Laterality Date  . ANTERIOR CERVICAL DECOMP/DISCECTOMY FUSION    . BACK SURGERY    . KNEE SURGERY      HOSPITAL MEDICATIONS . benazepril  10 mg Oral BID  . Brexpiprazole  2 mg Oral Daily  . Chlorhexidine Gluconate Cloth  6 each Topical Daily  . DULoxetine  70 mg Oral Daily  . levothyroxine  50 mcg Oral Q0600  . metoprolol succinate  25 mg Oral Daily  . pantoprazole  40 mg Oral QHS  . senna-docusate  1 tablet Oral BID  . tiZANidine  4 mg Oral QHS    HOME MEDICATIONS PRIOR TO ADMISSION Medications Prior to Admission  Medication Sig Dispense Refill  . ALPRAZolam (XANAX) 0.5 MG tablet Take 0.5 mg by mouth 3 (three) times daily as needed for anxiety.    Marland Kitchen. amphetamine-dextroamphetamine (ADDERALL) 10 MG tablet Take 10 mg by mouth 2 (two) times daily with a meal.    . Ascorbic Acid (VITAMIN C) 100 MG tablet Take 100 mg by mouth daily.    . Biotin 1 MG CAPS Take 1 mg by mouth daily.    . Brexpiprazole (REXULTI) 2 MG TABS Take 1 tablet by mouth daily.     . celecoxib (CELEBREX) 200 MG capsule Take 200 mg by mouth daily.    . cholecalciferol (VITAMIN D) 1000 units tablet Take 1,000 Units by mouth daily.    . cloNIDine (CATAPRES) 0.1 MG tablet Take 0.1 mg by mouth daily.     . DULoxetine  (CYMBALTA) 20 MG capsule Take 40 mg by mouth daily. With 30 mg capsule daily    . DULoxetine (CYMBALTA) 30 MG capsule Take 30 mg by mouth daily. With two 20 mg capsules    . HYDROcodone-acetaminophen (NORCO/VICODIN) 5-325 MG tablet Take 1 tablet by mouth every 6 (six) hours as needed for moderate pain.     Marland Kitchen. levothyroxine (SYNTHROID, LEVOTHROID) 50 MCG tablet Take 50 mcg by mouth daily before breakfast.    . mirtazapine (REMERON) 45 MG tablet Take 45 mg by mouth at bedtime.    . Multiple Vitamins-Minerals (MULTIVITAMIN WITH MINERALS) tablet Take 1 tablet by mouth daily.    . Omega-3 Fatty Acids (FISH OIL) 1000 MG CAPS Take 2,000 mg by mouth daily.    . Pitavastatin Calcium 2 MG TABS Take 2 mg by mouth at bedtime.    . selenium 50 MCG TABS tablet Take 50 mcg by mouth daily.    Marland Kitchen. tiZANidine (ZANAFLEX) 4 MG tablet Take 4 mg by mouth at bedtime.     Marland Kitchen. zolpidem (AMBIEN) 10 MG tablet Take 10 mg by mouth at bedtime as needed for sleep.  LABORATORY STUDIES CBC    Component Value Date/Time   WBC 10.5 11/14/2018 1450   RBC 4.67 11/14/2018 1450   HGB 13.9 11/14/2018 1450   HCT 41.1 11/14/2018 1450   PLT 260 11/14/2018 1450   MCV 88.0 11/14/2018 1450   MCH 29.8 11/14/2018 1450   MCHC 33.8 11/14/2018 1450   RDW 12.6 11/14/2018 1450   LYMPHSABS 1.9 11/14/2018 1450   MONOABS 0.8 11/14/2018 1450   EOSABS 0.5 11/14/2018 1450   BASOSABS 0.1 11/14/2018 1450   CMP    Component Value Date/Time   NA 141 11/14/2018 1450   K 4.0 11/14/2018 1450   CL 105 11/14/2018 1450   CO2 24 11/14/2018 1450   GLUCOSE 101 (H) 11/14/2018 1450   BUN 20 11/14/2018 1450   CREATININE 0.80 11/14/2018 1450   CALCIUM 9.7 11/14/2018 1450   PROT 7.2 11/14/2018 1450   ALBUMIN 4.5 11/14/2018 1450   AST 26 11/14/2018 1450   ALT 20 11/14/2018 1450   ALKPHOS 108 11/14/2018 1450   BILITOT 0.6 11/14/2018 1450   GFRNONAA >60 11/14/2018 1450   GFRAA >60 11/14/2018 1450   COAGS Lab Results  Component Value Date   INR  1.0 11/14/2018   Lipid Panel    Component Value Date/Time   TRIG 162 (H) 11/16/2018 0210   HgbA1C No results found for: HGBA1C Urinalysis    Component Value Date/Time   COLORURINE YELLOW 11/14/2018 1755   APPEARANCEUR CLEAR 11/14/2018 1755   LABSPEC 1.043 (H) 11/14/2018 1755   PHURINE 6.0 11/14/2018 1755   GLUCOSEU NEGATIVE 11/14/2018 1755   HGBUR NEGATIVE 11/14/2018 1755   BILIRUBINUR NEGATIVE 11/14/2018 1755   KETONESUR NEGATIVE 11/14/2018 1755   PROTEINUR NEGATIVE 11/14/2018 1755   NITRITE NEGATIVE 11/14/2018 1755   LEUKOCYTESUR NEGATIVE 11/14/2018 1755   Urine Drug Screen     Component Value Date/Time   LABOPIA NONE DETECTED 11/14/2018 1755   COCAINSCRNUR NONE DETECTED 11/14/2018 1755   LABBENZ POSITIVE (A) 11/14/2018 1755   AMPHETMU POSITIVE (A) 11/14/2018 1755   THCU NONE DETECTED 11/14/2018 1755   LABBARB NONE DETECTED 11/14/2018 1755    Alcohol Level No results found for: [facility]   SIGNIFICANT DIAGNOSTIC STUDIES  Ct Angio Head W Or Wo Contrast 11/14/2018 IMPRESSION:  1. Lobar intraparenchymal hemorrhage within the left parietal lobe with volume of 33 mL. Moderate edema with mild mass effect on the left occipital lobe.  2. Small amount of subdural extension of hematoma along the posterior falx cerebri.  3. Normal CTA of the intracranial arteries. No aneurysm or vascular malformation.   Mr Laqueta Jean And Wo Contrast Mr Mrv Head Wo Cm 11/14/2018 IMPRESSION:  1. Unchanged appearance of intraparenchymal hematoma within the left parietal lobe with mild associated mass effect. No abnormal contrast enhancement.  2. No dural venous sinus thrombosis.  3. The etiology of the hemorrhage remains unclear. Follow up MRI with and without contrast with susceptibility-weighted imaging might be helpful in 4-6 weeks.   EKG - SR rate 80 BPM. (See cardiology reading for complete details)    HISTORY OF PRESENT ILLNESS (from H&P 11/14/2018) Wendy Collier is a 53 y.o. female with  past medical history of migraines, fibromyalgia, psychiatric disorder presented to [facility] emergency department with 1 day history of headache, confusion followed by reduced vision on the right side and right leg weakness.  She states that her headache started yesterday and she thought this was due to her migraines.  She then noticed today that she had weakness in  her right leg as well as loss of control over her right hand.  She also felt that she has not been seeing as well and decided to present to the emergency room.  Her blood pressure at [facility]  was 138 x 87 mmHg, however subsequently increased to about 150 systolic.  CT head was obtained which showed a moderate sized, approximately 30 cc hemorrhage in the left posterior medial parietal  as well as occipital lobe.  Neurology at [facility] was consulted and a CTA head as well as the MR brain and MR venogram was performed prior to transfer. No dural venous sinus thrombosis was noted on MR venogram.  CTA showed no evidence of aneurysm or AVM.  There was some subdural extension of the hematoma.  On arrival patient is awake and following commands.  Blood pressure slightly low 140 systolic and therefore Cardene drip was started.  The patient denies any history of drug abuse.  Does state that she drinks "Monsters" caffeinated drinks infrequently needs to having 1 yesterday.   Date last known well:  Time last known well:  tPA Given:  NIHSS: 5 Baseline MRS 0  HOSPITAL COURSE Wendy Collier is a 53 y.o. female with history of migraines, fibromyalgia, bipolar, hypothyroidism, depression, and psychiatric disorder presenting with headache, confusion followed by reduced vision on theright sideand right leg weakness. She did not receive IV t-PA due to hemorrhage.  Intraparenchymal hematoma within the left parietal lobe - etiology likely due to untreated hypertension with excessive caffeine and sympathomimetics (had Monster drink, Dr. Alcus Dad,  on Adderall).   Resultant  Mild right-sided weakness and sensory deficit, hemianopia, ataxia right arm and leg  CT head - not performed  MR / MRV head - Unchanged appearance of intraparenchymal hematoma within the left parietal lobe with mild associated mass effect. No abnormal contrast enhancement. No dural venous sinus thrombosis. The etiology of the hemorrhage remains unclear. Follow up MRI with and without contrast with susceptibility-weighted imaging might be helpful in 4-6 weeks.  MRA head - not performed  CTA Head - Lobar intraparenchymal hemorrhage within the left parietal lobe with volume of 33 mL. Moderate edema with mild mass effect on the left occipital lobe. Small amount of subdural extension of hematoma along the posterior falx cerebri.   Carotid Doppler - not indicated  2D Echo - not indicated  [employer] Virus 2 - negative  LDL - not indicated  HgbA1c - not indicated  UDS - + for amphetamines and benzodiazepines (pt has Rxs for both)  VTE prophylaxis - SCDs  Diet - regular  No antithrombotic prior to admission, now on No antithrombotic due to bleed  Ongoing aggressive stroke risk factor management  Therapy recommendations:  CIR  Disposition:  Discharge to Inpt Rehab.  Hypertension  Stable (Cleviprex)  SBP goal < 160 mm Hg  Long-term BP goal normotensive  start metoprolol and lisinopril  Other Stroke Risk Factors  Migraines  Other Active Problems  Psychiatric history    DISCHARGE EXAM Vitals:   11/17/18 0024 11/17/18 0420 11/17/18 0832 11/17/18 1212  BP: 129/76 139/69 131/82 123/84  Pulse: (!) 59 69 74 84  Resp:   16 18  Temp: 98.2 F (36.8 C) 98 F (36.7 C) 98.6 F (37 C) 98.2 F (36.8 C)  TempSrc: Oral Oral Oral Oral  SpO2: 100% 100% 100% 100%  Weight:      Height:       Gen: NAD, conversant, well nourised, obese, well groomed  CV: RRR, no MRG. No Carotid Bruits. No peripheral edema, warm,  nontender Eyes: Conjunctivae clear without exudates or hemorrhage  Neuro: Detailed Neurologic Exam  Speech:    Speech is normal; fluent and spontaneous with normal comprehension.  Cognition:    The patient is oriented to person, place, and time;     recent and remote memory intact;     language fluent;     normal attention, concentration,     fund of knowledge Cranial Nerves:    The pupils are equal, round, and reactive to light.right hemianopia.  Extraocular movements are intact. Trigeminal sensation is intact and the muscles of mastication are normal. The face is symmetric. The palate elevates in the midline. Hearing intact. Voice is normal. Shoulder shrug is normal. The tongue has normal motion without fasciculations.   Coordination:    Ataxia right arm and leg  Motor Observation:    No asymmetry, no atrophy, and no involuntary movements noted. Tone:    Normal muscle tone.    Posture:    Posture is normal. normal erect    Strength:    Right mild hemiparesis 5-/5, otherwise intact     Sensation: decreased right arm and leg     Reflex Exam:  DTR's:    Deep tendon reflexes in the upper and lower extremities are normal bilaterally.   Toes:    The toes are downgoing bilaterally.   Clonus:    Clonus is absent.   Discharge Diet   Diet Order            Diet Heart Room service appropriate? Yes with Assist; Fluid consistency: Thin  Diet effective now             liquids  DISCHARGE PLAN  Disposition:  Transfer to [facility] for ongoing PT, OT and ST  No antithrombotic secondary to hemorrhage.  Recommend ongoing risk factor control by Primary Care Physician at time of discharge from inpatient rehabilitation.  Follow-up Dettinger, Elige Radon, MD in 2 weeks following discharge from rehab.  Follow-up in [facility] in 4 weeks following discharge from rehab, office to schedule an appointment.   Follow up MRI  with and without contrast with susceptibility-weighted imaging might be helpful in 4-6 weeks.   20 minutes were spent preparing discharge.  Delton See PA-C [facility] Pager [phone] 11/17/2018, 4:15 PM

## 2018-11-17 NOTE — Progress Notes (Signed)
Patient ID: Wendy Collier, female   DOB: 13-Mar-1966, 53 y.o.   MRN: [id number] Patient arrived from 3W via wheelchair to 5N11. Patient oriented to room, safety plan, rehab schedule, and fall prevention plan. Patient resting comfortably in bed with no complaints of pain.

## 2018-11-17 NOTE — PMR Pre-admission (Signed)
PMR Admission Coordinator Pre-Admission Assessment  Patient: Wendy Collier is an 53 y.o., female MRN: [id number] DOB: [birth year] Height: '5\' 6"'  (167.6 cm) Weight: 74.6 kg  Insurance Information HMO:     PPO:      PCP:      IPA:      80/20: yes     OTHER:  PRIMARY: Medicare A and B      Policy#: [id number]      Subscriber: patient CM Name:       Phone#:      Fax#:  Pre-Cert#:       Employer:  Benefits:  Phone #: verified online      Name: verified eligibility online via [employer] on 11/17/18 Eff. Date: Part A 09/23/12; Part B 09/23/12     Deduct: $1,408      Out of Pocket Max: NA      Life Max: NA CIR: Covered per Medicare Guidelines once yearly deductible is met      VBT:YOMA 1-20, 100%; days 21-100, 80% Outpatient: 80%     Co-Pay: 20% Home Health: 100%      Co-Pay:  DME: 80%     Co-Pay: 20% Providers: Pt's choice SECONDARY: None      Policy#:       Subscriber:  CM Name:       Phone#:      Fax#:  Pre-Cert#:       Employer:  Benefits:  Phone #:      Name:  Eff. Date:      Deduct:       Out of Pocket Max:       Life Max:  CIR:       SNF:  Outpatient:      Co-Pay:  Home Health:       Co-Pay:  DME:      Co-Pay:   Medicaid Application Date:       Case Manager:  Disability Application Date:      Case Worker:   The "Data Collection Information Summary" for patients in Inpatient Rehabilitation Facilities with attached "Privacy Act [place] Records" was provided and verbally reviewed with: Patient  Emergency Contact Information Contact Information    Name Relation Home Work Mobile   [place] Spouse [phone]  [phone]   Henry Russel Daughter [phone]  [phone]      Current Medical History  Patient Admitting Diagnosis: 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  UDS positive for amphetamines and benzodiazepines.  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  No aneurysm or vascular malformation noted.  MRV of brain was negative for dural venous sinus thrombosis and no change in left parietal lobe noted.  Recommendations to repeat follow-up MRI brain with contrast in 4 to 6 weeks.  She was started on Cardene drip to keep SBP less than 150.  She reported drinking energy drinks daily.  Neurology felt that intraparenchymal hemorrhage likely due to excessive caffeine and sympathomimetics--Monster drinks while on Adderall.  Her headaches are improving and patient reported history of hypertension however  had stopped taking medications for a few days--SBP goal <160 per neurology. Patient with resultant mild right-sided weakness with sensory deficits and hemianopsia affecting mobility and ADLs.  CIR recommended due to functional deficits. Complete NIHSS TOTAL: 3  Patient's medical record from [facility] has been reviewed by the rehabilitation admission coordinator and physician.  Past Medical History  Past Medical History:  Diagnosis Date  . Anxiety disorder    with panic attacks.   . Arthritis   . Bipolar 1 disorder ([place])   . Chronic fatigue   . Chronic pain    neck/back with spasms.   . Depression   . Fibromyalgia   . Frequent headaches   . Thyroid disease    Hypothyroidism     Family History   family history includes Alcohol abuse in her father and mother; COPD in her mother; Cancer in her father; Depression in her mother; Drug abuse in her mother; Early death in her mother; Mental illness in her brother, father,  and mother.  Prior Rehab/Hospitalizations Has the patient had prior rehab or hospitalizations prior to admission? Yes  Has the patient had major surgery during 100 days prior to admission? No   Current Medications  Current Facility-Administered Medications:  .  acetaminophen (TYLENOL) tablet 650 mg, 650 mg, Oral, Q4H PRN, 650 mg at 11/16/18 1658 **OR** acetaminophen (TYLENOL) solution 650 mg, 650 mg, Per Tube, Q4H PRN **OR** acetaminophen (TYLENOL) suppository 650 mg, 650 mg, Rectal, Q4H PRN, Aroor, Karena Addison R, MD .  benazepril (LOTENSIN) tablet 10 mg, 10 mg, Oral, BID, Rinehuls, David L, PA-C, 10 mg at 11/17/18 0855 .  Brexpiprazole (Rexulti) 2 mg tablet, 2 mg, Oral, Daily, Skeet Simmer, RPH, 2 mg at 11/17/18 9678 .  Chlorhexidine Gluconate Cloth 2 % PADS 6 each, 6 each, Topical, Daily, Amie Portland, MD, 6 each at 11/17/18 [phone] .  clevidipine (CLEVIPREX) infusion 0.5 mg/mL, 0-21 mg/hr, Intravenous, Continuous, Aroor, Lanice Schwab, MD, Stopped at 11/16/18 0906 .  diphenhydrAMINE (BENADRYL) capsule 25 mg, 25 mg, Oral, Q6H PRN, Melvenia Beam, MD, 25 mg at 11/17/18 0015 .  DULoxetine (CYMBALTA) DR capsule 70 mg, 70 mg, Oral, Daily, Rinehuls, David L, PA-C, 70 mg at 11/17/18 0854 .  hydrALAZINE (APRESOLINE) injection 10 mg, 10 mg, Intravenous, Q4H PRN, Rinehuls, Early Chars, PA-C .  levothyroxine (SYNTHROID) tablet 50 mcg, 50 mcg, Oral, Q0600, Rinehuls, David L, PA-C, 50 mcg at 11/17/18 0501 .  metoprolol succinate (TOPROL-XL) 24 hr tablet 25 mg, 25 mg, Oral, Daily, Rinehuls, David L, PA-C, 25 mg at 11/17/18 0855 .  ondansetron (ZOFRAN) injection 4 mg, 4 mg, Intravenous, Q6H PRN, Sarina Ill B, MD, 4 mg at 11/17/18 1045 .  ondansetron (ZOFRAN) tablet 4 mg, 4 mg, Oral, Q8H PRN, Melvenia Beam, MD, 4 mg at 11/17/18 0017 .  pantoprazole (PROTONIX) EC tablet 40 mg, 40 mg, Oral, QHS, Rosalin Hawking, MD .  senna-docusate (Senokot-S) tablet 1 tablet, 1 tablet, Oral, BID, Aroor, Lanice Schwab, MD, 1  tablet at 11/17/18 0856 .  tiZANidine (ZANAFLEX) tablet 4 mg, 4 mg, Oral, QHS, Rinehuls, David L, PA-C, 4 mg at 11/16/18 2121 .  traMADol (ULTRAM) tablet 50 mg, 50 mg, Oral, Q4H PRN, Melvenia Beam, MD, 50 mg at 11/17/18 0175  Patients Current Diet:  Diet Order            Diet Heart Room service appropriate? Yes with Assist; Fluid consistency: Thin  Diet effective now  Precautions / Restrictions Precautions Precautions: Fall Precaution Comments: SBP <140 Restrictions Weight Bearing Restrictions: No   Has the patient had 2 or more falls or a fall with injury in the past year? Yes  Prior Activity Level Community (5-7x/wk): active PTA; Independent PTA, drove when needed too.   Prior Functional Level Self Care: Did the patient need help bathing, dressing, using the toilet or eating? Independent  Indoor Mobility: Did the patient need assistance with walking from room to room (with or without device)? Independent  Stairs: Did the patient need assistance with internal or external stairs (with or without device)? Independent  Functional Cognition: Did the patient need help planning regular tasks such as shopping or remembering to take medications? Needed some help  Home Assistive Devices / [place] Devices/Equipment: Cane (specify quad or straight), Walker (specify type) Home Equipment: Walker - 2 wheels, Cane - single point, Grab bars - tub/shower  Prior Device Use: Indicate devices/aids used by the patient prior to current illness, exacerbation or injury? Walker and SPC at home, but Independent prior with no AD use.   Current Functional Level Cognition  Arousal/Alertness: Awake/alert Overall Cognitive Status: Impaired/Different from baseline Current Attention Level: Selective Orientation Level: Oriented X4 Safety/Judgement: Decreased awareness of safety, Decreased awareness of deficits General Comments: continues to require cueing for safety  awareness and awareness of deficits; R inattention note Attention: Sustained Sustained Attention: Impaired Sustained Attention Impairment: Verbal complex, Functional complex Memory: Impaired Memory Impairment: Retrieval deficit, Decreased recall of new information, Decreased short term memory Decreased Short Term Memory: Verbal basic, Functional basic Awareness: Appears intact Problem Solving: Appears intact Executive Function: Writer: Impaired Organizing Impairment: Verbal basic, Functional basic Safety/Judgment: Impaired    Extremity Assessment (includes Sensation/Coordination)  Upper Extremity Assessment: Defer to OT evaluation RUE Deficits / Details: Decreased coordination, decreased attention, strength is good, does report she can feel on the right arm but if feels differently from the left RUE Sensation: decreased proprioception RUE Coordination: decreased fine motor, decreased gross motor  Lower Extremity Assessment: RLE deficits/detail RLE Deficits / Details: Grossly ~4/5 throughout except hip flexion ~3/5 with giveway weakness. Reports hx of knee issues.  RLE Sensation: decreased light touch, decreased proprioception RLE Coordination: decreased fine motor, decreased gross motor    ADLs  Overall ADL's : Needs assistance/impaired Eating/Feeding: Set up, Sitting Grooming: Minimal assistance, Standing, Wash/dry hands, Wash/dry face, Oral care Grooming Details (indicate cue type and reason): min assist for standing balance at times, cueing to utilize R UE during functional tasks (as gross stabilizer for [facility]) Upper Body Bathing: Minimal assistance, Sitting Lower Body Bathing: Moderate assistance Lower Body Bathing Details (indicate cue type and reason): Mod A +2 sit<>stand Upper Body Dressing : Minimal assistance, Sitting Lower Body Dressing: Moderate assistance Lower Body Dressing Details (indicate cue type and reason): Mod A +2 sit<>stand Toilet Transfer:  Minimal assistance, Stand-pivot, BSC Toilet Transfer Details (indicate cue type and reason): Bil HHA Toileting- Clothing Manipulation and Hygiene: Minimal assistance, Sit to/from stand Toileting - Clothing Manipulation Details (indicate cue type and reason): min assist to maintain balance in standing  General ADL Comments: continues to be limited by R inattention, coordination and balance    Mobility  Overal bed mobility: Needs Assistance Bed Mobility: Supine to Sit Supine to sit: Min guard, HOB elevated General bed mobility comments: Able to get to EOB without assist    Transfers  Overall transfer level: Needs assistance Equipment used: 1 person hand held assist Transfers: Sit to/from Stand, [employer]  Transfers Sit to Stand: Min assist Stand pivot transfers: Min assist General transfer comment: cueing for hand placement and safety (esp R side), min assist to power up into standing and poor awareness of R UE/LE once standing     Ambulation / Gait / Stairs / Wheelchair Mobility  Ambulation/Gait Ambulation/Gait assistance: Mod assist Gait Distance (Feet): 60 Feet Assistive device: 1 person hand held assist Gait Pattern/deviations: Step-through pattern, Ataxic, Decreased step length - left, Decreased stance time - right, Narrow base of support General Gait Details: Slow, deliberate and ataxic like gait with narrow BoS and difficulty controlling placement of RLE using visual cues and therapist's foot. Cues to wide Bos. Used vision to help with LE advancement.  Gait velocity: decreased Gait velocity interpretation: <1.31 ft/sec, indicative of household ambulator    Posture / Balance Dynamic Sitting Balance Sitting balance - Comments: Able to perform pericare with Min guard assist sitting down.  Balance Overall balance assessment: Needs assistance Sitting-balance support: Feet supported, No upper extremity supported Sitting balance-Leahy Scale: Fair Sitting balance - Comments: Able to  perform pericare with Min guard assist sitting down.  Standing balance support: Single extremity supported Standing balance-Leahy Scale: Poor Standing balance comment: standing at sink during grooming, preference to at least 1  UE support     Special needs/care consideration BiPAP/CPAP : no CPM : no Continuous Drip IV : no Dialysis : no        Days : no Life Vest : no Oxygen : no Special Bed :no Trach Size : no Wound Vac (area) : no      Location : no Skin: excoriated (scratch marks) to back             Bowel mgmt: last BM: 11/15/18; continent Bladder mgmt: continent, but reports nighttime incontinence (for some time now) Diabetic mgmt: "pre-diabetic" per pt report Behavioral consideration: psychiatric history (Bi-polar pt per report) Chemo/radiation : no   Previous Home Environment (from acute therapy documentation) Living Arrangements: Spouse/significant other  Lives With: Spouse Available Help at Discharge: Family, Available 24 hours/day Type of Home: House Home Layout: One level Home Access: Stairs to enter [employer] of Steps: 2 Bathroom Shower/Tub: Tub/shower unit, Architectural technologist: Standard Home Care Services: No  Discharge Living Setting Plans for Discharge Living Setting: Patient's home, Lives with (comment)(husband) Type of Home at Discharge: House Discharge Home Layout: One level Discharge Home Access: Stairs to enter Entrance Stairs-Rails: None Entrance Stairs-Number of Steps: 2 Discharge Bathroom Shower/Tub: Tub/shower unit Discharge Bathroom Toilet: Standard Discharge Bathroom Accessibility: Yes How Accessible: Accessible via walker Does the patient have any problems obtaining your medications?: Yes (Describe)(at times financially; however not recently per pt)  Social/Family/Support Systems Patient Roles: Spouse Contact Information: husband: Shanon Brow): [phone] (cell: [phone]) Anticipated Caregiver: husband Anticipated  Caregiver's Contact Information: see above Ability/Limitations of Caregiver: Min A Caregiver Availability: 24/7 Discharge Plan Discussed with Primary Caregiver: Yes Is Caregiver In Agreement with Plan?: Yes Does Caregiver/Family have Issues with Lodging/Transportation while Pt is in Rehab?: No  Goals/Additional Needs Patient/Family Goal for Rehab: PT/OT: Supervision; SLP: Mod I Expected length of stay: 7-10 days Cultural Considerations: Christian Dietary Needs: heart healthy, thin liquids; room service with assist Equipment Needs: TBD Pt/Family Agrees to Admission and willing to participate: Yes Program Orientation Provided & Reviewed with Pt/Caregiver Including Roles  & Responsibilities: Yes(husband)  Barriers to Discharge: Home environment access/layout  Barriers to Discharge Comments: steps to enter  Decrease burden of Care through IP rehab admission: NA  Possible need  for SNF placement upon discharge: Not anticipated; pt has good social support as her husband isn't working and will be able to be at home for 24/7 support as needed.  Patient Condition: I have reviewed medical records from [facility], spoken with PA, RN, and patient and spouse. I met with patient at the bedside for inpatient rehabilitation assessment.  Patient will benefit from ongoing PT, OT and SLP, can actively participate in 3 hours of therapy a day 5 days of the week, and can make measurable gains during the admission.  Patient will also benefit from the coordinated team approach during an Inpatient Acute Rehabilitation admission.  The patient will receive intensive therapy as well as Rehabilitation physician, nursing, social worker, and care management interventions.  Due to bladder management, bowel management, safety, disease management, medication administration, pain management and patient education the patient requires 24 hour a day rehabilitation nursing.  The patient is currently Mod A with  mobility and Min A for basic ADLs.  Discharge setting and therapy post discharge at home with home health is anticipated.  Patient has agreed to participate in the Acute Inpatient Rehabilitation Program and will admit today.  Preadmission Screen Completed By:  Jhonnie Garner, 11/17/2018 3:56 PM ______________________________________________________________________   Discussed status with Dr. Naaman Plummer on 11/17/18 at 3:55PM and received approval for admission today.  Admission Coordinator:  Jhonnie Garner, OT, time 3:55PM/Date 11/17/18   Assessment/Plan: Diagnosis: left parietal hemorrhage 1. Does the need for close, 24 hr/day Medical supervision in concert with the patient's rehab needs make it unreasonable for this patient to be served in a less intensive setting? Yes 2. Co-Morbidities requiring supervision/potential complications: fms, bipolar, anxiety 3. Due to bladder management, bowel management, safety, skin/wound care, disease management, medication administration, pain management and patient education, does the patient require 24 hr/day rehab nursing? Yes 4. Does the patient require coordinated care of a physician, rehab nurse, PT (1- hrs/day, 5 days/week), OT (1-2 hrs/day, 5 days/week) and SLP (1-2 hrs/day, 5 days/week) to address physical and functional deficits in the context of the above medical diagnosis(es)? Yes Addressing deficits in the following areas: balance, endurance, locomotion, strength, transferring, bowel/bladder control, bathing, dressing, feeding, grooming, toileting, cognition, speech, language, swallowing and psychosocial support 5. Can the patient actively participate in an intensive therapy program of at least 3 hrs of therapy 5 days a week? Yes 6. The potential for patient to make measurable gains while on inpatient rehab is excellent 7. Anticipated functional outcomes upon discharge from inpatients are: supervision PT, supervision OT, modified independent SLP 8. Estimated rehab  length of stay to reach the above functional goals is: 7-10 days 9. Anticipated D/C setting: Home 10. Anticipated post D/C treatments: [place] therapy 11. Overall Rehab/Functional Prognosis: excellent  MD Signature: Meredith Staggers, MD, [facility] 11/17/2018

## 2018-11-17 NOTE — Progress Notes (Signed)
Meredith Staggers, MD  Physician  Physical Medicine and Rehabilitation  PMR Pre-admission  Signed  Date of Service:  11/17/2018 3:36 PM       Related encounter: ED to Hosp-Admission (Discharged) from 11/14/2018 in [facility]      Signed         PMR Admission Coordinator Pre-Admission Assessment  Patient: Wendy Collier is an 53 y.o., female MRN: [id number] DOB: August 28, 1965 Height: '5\' 6"'  (167.6 cm) Weight: 74.6 kg  Insurance Information HMO:     PPO:      PCP:      IPA:      80/20: yes     OTHER:  PRIMARY: Medicare A and B      Policy#: [id number]      Subscriber: patient CM Name:       Phone#:      Fax#:  Pre-Cert#:       Employer:  Benefits:  Phone #: verified online      Name: verified eligibility online via [place] on 11/17/18 Eff. Date: Part A 09/23/12; Part B 09/23/12     Deduct: $1,408      Out of Pocket Max: NA      Life Max: NA CIR: Covered per Medicare Guidelines once yearly deductible is met      JHE:RDEY 1-20, 100%; days 21-100, 80% Outpatient: 80%     Co-Pay: 20% Home Health: 100%      Co-Pay:  DME: 80%     Co-Pay: 20% Providers: Pt's choice SECONDARY: None      Policy#:       Subscriber:  CM Name:       Phone#:      Fax#:  Pre-Cert#:       Employer:  Benefits:  Phone #:      Name:  Eff. Date:      Deduct:       Out of Pocket Max:       Life Max:  CIR:       SNF:  Outpatient:      Co-Pay:  Home Health:       Co-Pay:  DME:      Co-Pay:   Medicaid Application Date:       Case Manager:  Disability Application Date:      Case Worker:   The "Data Collection Information Summary" for patients in Inpatient Rehabilitation Facilities with attached "Privacy Act [place] Records" was provided and verbally reviewed with: Patient  Emergency Contact Information         Contact Information    Name Relation Home Work Mobile   [place] Spouse [phone]  [phone]   Henry Russel Daughter [phone]  [phone]       Current Medical History  Patient Admitting Diagnosis: Intraparenchymal hematoma within the left parietal lobe  History of Present Illness: Wendy Collier is a 53 year old female with history of chronic pain, chronic fatigue, fibromyalgia, bipolar disorder with anxiety and depression; who was admitted on 11/14/2018 with 1 day history of headaches and confusion followed by visual changes on right and right lower extremity weakness progressing to right upper extremity weakness. UDS positive for amphetamines and benzodiazepines. CTAof head done revealing lobar IPH in left parietal lobe with moderate edema and mass-effect on left occipital lobe and small amount of subdural extension along posterior falx cerebri. No aneurysm or vascular malformation noted. MRV of brain was negative for dural venous sinus thrombosis and no change in left parietal lobe noted. Recommendations to  repeat follow-up MRI brain with contrast in 4 to 6 weeks.She was started on Cardene drip to keep SBP less than 150. She reported drinking energy drinks daily. Neurology felt that intraparenchymal hemorrhage likely due to excessive caffeine and sympathomimetics--Monster drinks while on Adderall.Her headaches are improving and patient reported history of hypertension however had stopped taking medicationsfor a few days--SBPgoal<160per neurology.Patient with resultant mild right-sided weakness with sensory deficits and hemianopsia affecting mobility and ADLs. CIR recommended due to functional deficits. Complete NIHSS TOTAL: 3  Patient's medical record from [facility] has been reviewed by the rehabilitation admission coordinator and physician.  Past Medical History      Past Medical History:  Diagnosis Date  . Anxiety disorder    with panic attacks.   . Arthritis   . Bipolar 1 disorder ([place])   . Chronic fatigue   . Chronic pain    neck/back with spasms.   . Depression   .  Fibromyalgia   . Frequent headaches   . Thyroid disease    Hypothyroidism     Family History   family history includes Alcohol abuse in her father and mother; COPD in her mother; Cancer in her father; Depression in her mother; Drug abuse in her mother; Early death in her mother; Mental illness in her brother, father, and mother.  Prior Rehab/Hospitalizations Has the patient had prior rehab or hospitalizations prior to admission? Yes  Has the patient had major surgery during 100 days prior to admission? No              Current Medications  Current Facility-Administered Medications:  .  acetaminophen (TYLENOL) tablet 650 mg, 650 mg, Oral, Q4H PRN, 650 mg at 11/16/18 1658 **OR** acetaminophen (TYLENOL) solution 650 mg, 650 mg, Per Tube, Q4H PRN **OR** acetaminophen (TYLENOL) suppository 650 mg, 650 mg, Rectal, Q4H PRN, Aroor, Karena Addison R, MD .  benazepril (LOTENSIN) tablet 10 mg, 10 mg, Oral, BID, Rinehuls, David L, PA-C, 10 mg at 11/17/18 0855 .  Brexpiprazole (Rexulti) 2 mg tablet, 2 mg, Oral, Daily, Skeet Simmer, RPH, 2 mg at 11/17/18 6440 .  Chlorhexidine Gluconate Cloth 2 % PADS 6 each, 6 each, Topical, Daily, Amie Portland, MD, 6 each at 11/17/18 [phone] .  clevidipine (CLEVIPREX) infusion 0.5 mg/mL, 0-21 mg/hr, Intravenous, Continuous, Aroor, Lanice Schwab, MD, Stopped at 11/16/18 0906 .  diphenhydrAMINE (BENADRYL) capsule 25 mg, 25 mg, Oral, Q6H PRN, Melvenia Beam, MD, 25 mg at 11/17/18 0015 .  DULoxetine (CYMBALTA) DR capsule 70 mg, 70 mg, Oral, Daily, Rinehuls, David L, PA-C, 70 mg at 11/17/18 0854 .  hydrALAZINE (APRESOLINE) injection 10 mg, 10 mg, Intravenous, Q4H PRN, Rinehuls, Early Chars, PA-C .  levothyroxine (SYNTHROID) tablet 50 mcg, 50 mcg, Oral, Q0600, Rinehuls, David L, PA-C, 50 mcg at 11/17/18 0501 .  metoprolol succinate (TOPROL-XL) 24 hr tablet 25 mg, 25 mg, Oral, Daily, Rinehuls, David L, PA-C, 25 mg at 11/17/18 0855 .  ondansetron (ZOFRAN) injection 4 mg, 4 mg,  Intravenous, Q6H PRN, Sarina Ill B, MD, 4 mg at 11/17/18 1045 .  ondansetron (ZOFRAN) tablet 4 mg, 4 mg, Oral, Q8H PRN, Melvenia Beam, MD, 4 mg at 11/17/18 0017 .  pantoprazole (PROTONIX) EC tablet 40 mg, 40 mg, Oral, QHS, Rosalin Hawking, MD .  senna-docusate (Senokot-S) tablet 1 tablet, 1 tablet, Oral, BID, Aroor, Lanice Schwab, MD, 1 tablet at 11/17/18 0856 .  tiZANidine (ZANAFLEX) tablet 4 mg, 4 mg, Oral, QHS, Rinehuls, David L, PA-C, 4 mg at 11/16/18  2121 .  traMADol (ULTRAM) tablet 50 mg, 50 mg, Oral, Q4H PRN, Melvenia Beam, MD, 50 mg at 11/17/18 6301  Patients Current Diet:     Diet Order                  Diet Heart Room service appropriate? Yes with Assist; Fluid consistency: Thin  Diet effective now               Precautions / Restrictions Precautions Precautions: Fall Precaution Comments: SBP <140 Restrictions Weight Bearing Restrictions: No   Has the patient had 2 or more falls or a fall with injury in the past year? Yes  Prior Activity Level Community (5-7x/wk): active PTA; Independent PTA, drove when needed too.   Prior Functional Level Self Care: Did the patient need help bathing, dressing, using the toilet or eating? Independent  Indoor Mobility: Did the patient need assistance with walking from room to room (with or without device)? Independent  Stairs: Did the patient need assistance with internal or external stairs (with or without device)? Independent  Functional Cognition: Did the patient need help planning regular tasks such as shopping or remembering to take medications? Needed some help  Home Assistive Devices / [place] Devices/Equipment: Cane (specify quad or straight), Walker (specify type) Home Equipment: Walker - 2 wheels, Cane - single point, Grab bars - tub/shower  Prior Device Use: Indicate devices/aids used by the patient prior to current illness, exacerbation or injury? Walker and SPC at home, but  Independent prior with no AD use.   Current Functional Level Cognition  Arousal/Alertness: Awake/alert Overall Cognitive Status: Impaired/Different from baseline Current Attention Level: Selective Orientation Level: Oriented X4 Safety/Judgement: Decreased awareness of safety, Decreased awareness of deficits General Comments: continues to require cueing for safety awareness and awareness of deficits; R inattention note Attention: Sustained Sustained Attention: Impaired Sustained Attention Impairment: Verbal complex, Functional complex Memory: Impaired Memory Impairment: Retrieval deficit, Decreased recall of new information, Decreased short term memory Decreased Short Term Memory: Verbal basic, Functional basic Awareness: Appears intact Problem Solving: Appears intact Executive Function: Writer: Impaired Organizing Impairment: Verbal basic, Functional basic Safety/Judgment: Impaired    Extremity Assessment (includes Sensation/Coordination)  Upper Extremity Assessment: Defer to OT evaluation RUE Deficits / Details: Decreased coordination, decreased attention, strength is good, does report she can feel on the right arm but if feels differently from the left RUE Sensation: decreased proprioception RUE Coordination: decreased fine motor, decreased gross motor  Lower Extremity Assessment: RLE deficits/detail RLE Deficits / Details: Grossly ~4/5 throughout except hip flexion ~3/5 with giveway weakness. Reports hx of knee issues.  RLE Sensation: decreased light touch, decreased proprioception RLE Coordination: decreased fine motor, decreased gross motor    ADLs  Overall ADL's : Needs assistance/impaired Eating/Feeding: Set up, Sitting Grooming: Minimal assistance, Standing, Wash/dry hands, Wash/dry face, Oral care Grooming Details (indicate cue type and reason): min assist for standing balance at times, cueing to utilize R UE during functional tasks (as gross  stabilizer for [facility]) Upper Body Bathing: Minimal assistance, Sitting Lower Body Bathing: Moderate assistance Lower Body Bathing Details (indicate cue type and reason): Mod A +2 sit<>stand Upper Body Dressing : Minimal assistance, Sitting Lower Body Dressing: Moderate assistance Lower Body Dressing Details (indicate cue type and reason): Mod A +2 sit<>stand Toilet Transfer: Minimal assistance, Stand-pivot, BSC Toilet Transfer Details (indicate cue type and reason): Bil HHA Toileting- Clothing Manipulation and Hygiene: Minimal assistance, Sit to/from stand Toileting - Clothing Manipulation Details (indicate cue type and  reason): min assist to maintain balance in standing  General ADL Comments: continues to be limited by R inattention, coordination and balance    Mobility  Overal bed mobility: Needs Assistance Bed Mobility: Supine to Sit Supine to sit: Min guard, HOB elevated General bed mobility comments: Able to get to EOB without assist    Transfers  Overall transfer level: Needs assistance Equipment used: 1 person hand held assist Transfers: Sit to/from Stand, Stand Pivot Transfers Sit to Stand: Min assist Stand pivot transfers: Min assist General transfer comment: cueing for hand placement and safety (esp R side), min assist to power up into standing and poor awareness of R UE/LE once standing     Ambulation / Gait / Stairs / Wheelchair Mobility  Ambulation/Gait Ambulation/Gait assistance: Mod assist Gait Distance (Feet): 60 Feet Assistive device: 1 person hand held assist Gait Pattern/deviations: Step-through pattern, Ataxic, Decreased step length - left, Decreased stance time - right, Narrow base of support General Gait Details: Slow, deliberate and ataxic like gait with narrow BoS and difficulty controlling placement of RLE using visual cues and therapist's foot. Cues to wide Bos. Used vision to help with LE advancement.  Gait velocity: decreased Gait velocity  interpretation: <1.31 ft/sec, indicative of household ambulator    Posture / Balance Dynamic Sitting Balance Sitting balance - Comments: Able to perform pericare with Min guard assist sitting down.  Balance Overall balance assessment: Needs assistance Sitting-balance support: Feet supported, No upper extremity supported Sitting balance-Leahy Scale: Fair Sitting balance - Comments: Able to perform pericare with Min guard assist sitting down.  Standing balance support: Single extremity supported Standing balance-Leahy Scale: Poor Standing balance comment: standing at sink during grooming, preference to at least 1  UE support     Special needs/care consideration BiPAP/CPAP : no CPM : no Continuous Drip IV : no Dialysis : no        Days : no Life Vest : no Oxygen : no Special Bed :no Trach Size : no Wound Vac (area) : no      Location : no Skin: excoriated (scratch marks) to back             Bowel mgmt: last BM: 11/15/18; continent Bladder mgmt: continent, but reports nighttime incontinence (for some time now) Diabetic mgmt: "pre-diabetic" per pt report Behavioral consideration: psychiatric history (Bi-polar pt per report) Chemo/radiation : no   Previous Home Environment (from acute therapy documentation) Living Arrangements: Spouse/significant other  Lives With: Spouse Available Help at Discharge: Family, Available 24 hours/day Type of Home: House Home Layout: One level Home Access: Stairs to enter [employer] of Steps: 2 Bathroom Shower/Tub: Tub/shower unit, Architectural technologist: Standard Home Care Services: No  Discharge Living Setting Plans for Discharge Living Setting: Patient's home, Lives with (comment)(husband) Type of Home at Discharge: House Discharge Home Layout: One level Discharge Home Access: Stairs to enter Entrance Stairs-Rails: None Entrance Stairs-Number of Steps: 2 Discharge Bathroom Shower/Tub: Tub/shower unit Discharge Bathroom  Toilet: Standard Discharge Bathroom Accessibility: Yes How Accessible: Accessible via walker Does the patient have any problems obtaining your medications?: Yes (Describe)(at times financially; however not recently per pt)  Social/Family/Support Systems Patient Roles: Spouse Contact Information: husband: Shanon Brow): [phone] (cell: [phone]) Anticipated Caregiver: husband Anticipated Caregiver's Contact Information: see above Ability/Limitations of Caregiver: Min A Caregiver Availability: 24/7 Discharge Plan Discussed with Primary Caregiver: Yes Is Caregiver In Agreement with Plan?: Yes Does Caregiver/Family have Issues with Lodging/Transportation while Pt is in Rehab?: No  Goals/Additional Needs Patient/Family Goal for  Rehab: PT/OT: Supervision; SLP: Mod I Expected length of stay: 7-10 days Cultural Considerations: Christian Dietary Needs: heart healthy, thin liquids; room service with assist Equipment Needs: TBD Pt/Family Agrees to Admission and willing to participate: Yes Program Orientation Provided & Reviewed with Pt/Caregiver Including Roles  & Responsibilities: Yes(husband)  Barriers to Discharge: Home environment access/layout  Barriers to Discharge Comments: steps to enter  Decrease burden of Care through IP rehab admission: NA  Possible need for SNF placement upon discharge: Not anticipated; pt has good social support as her husband isn't working and will be able to be at home for 24/7 support as needed.  Patient Condition: I have reviewed medical records from [facility], spoken with PA, RN, and patient and spouse. I met with patient at the bedside for inpatient rehabilitation assessment.  Patient will benefit from ongoing PT, OT and SLP, can actively participate in 3 hours of therapy a day 5 days of the week, and can make measurable gains during the admission.  Patient will also benefit from the coordinated team approach during an Inpatient Acute  Rehabilitation admission.  The patient will receive intensive therapy as well as Rehabilitation physician, nursing, social worker, and care management interventions.  Due to bladder management, bowel management, safety, disease management, medication administration, pain management and patient education the patient requires 24 hour a day rehabilitation nursing.  The patient is currently Mod A with mobility and Min A for basic ADLs.  Discharge setting and therapy post discharge at home with home health is anticipated.  Patient has agreed to participate in the Acute Inpatient Rehabilitation Program and will admit today.  Preadmission Screen Completed By:  Jhonnie Garner, 11/17/2018 3:56 PM ______________________________________________________________________   Discussed status with Dr. Naaman Plummer on 11/17/18 at 3:55PM and received approval for admission today.  Admission Coordinator:  Jhonnie Garner, OT, time 3:55PM/Date 11/17/18   Assessment/Plan: Diagnosis: left parietal hemorrhage 1. Does the need for close, 24 hr/day Medical supervision in concert with the patient's rehab needs make it unreasonable for this patient to be served in a less intensive setting? Yes 2. Co-Morbidities requiring supervision/potential complications: fms, bipolar, anxiety 3. Due to bladder management, bowel management, safety, skin/wound care, disease management, medication administration, pain management and patient education, does the patient require 24 hr/day rehab nursing? Yes 4. Does the patient require coordinated care of a physician, rehab nurse, PT (1- hrs/day, 5 days/week), OT (1-2 hrs/day, 5 days/week) and SLP (1-2 hrs/day, 5 days/week) to address physical and functional deficits in the context of the above medical diagnosis(es)? Yes Addressing deficits in the following areas: balance, endurance, locomotion, strength, transferring, bowel/bladder control, bathing, dressing, feeding, grooming, toileting, cognition, speech,  language, swallowing and psychosocial support 5. Can the patient actively participate in an intensive therapy program of at least 3 hrs of therapy 5 days a week? Yes 6. The potential for patient to make measurable gains while on inpatient rehab is excellent 7. Anticipated functional outcomes upon discharge from inpatients are: supervision PT, supervision OT, modified independent SLP 8. Estimated rehab length of stay to reach the above functional goals is: 7-10 days 9. Anticipated D/C setting: Home 10. Anticipated post D/C treatments: Cherry therapy 11. Overall Rehab/Functional Prognosis: excellent  MD Signature: Meredith Staggers, MD, [facility] 11/17/2018         Revision History    Date/Time User Provider Type Action  11/17/2018 4:36 PM Meredith Staggers, MD Physician Sign  11/17/2018 3:56 PM Jhonnie Garner, OT Rehab  Admission Coordinator Share  View Details Report

## 2018-11-18 ENCOUNTER — Inpatient Hospital Stay (HOSPITAL_COMMUNITY): Payer: Medicare Other | Admitting: Speech Pathology

## 2018-11-18 ENCOUNTER — Inpatient Hospital Stay (HOSPITAL_COMMUNITY): Payer: Medicare Other

## 2018-11-18 ENCOUNTER — Inpatient Hospital Stay (HOSPITAL_COMMUNITY): Payer: Medicare Other | Admitting: Occupational Therapy

## 2018-11-18 ENCOUNTER — Encounter: Payer: Self-pay | Admitting: *Deleted

## 2018-11-18 DIAGNOSIS — I10 Essential (primary) hypertension: Secondary | ICD-10-CM

## 2018-11-18 DIAGNOSIS — I611 Nontraumatic intracerebral hemorrhage in hemisphere, cortical: Secondary | ICD-10-CM

## 2018-11-18 DIAGNOSIS — F909 Attention-deficit hyperactivity disorder, unspecified type: Secondary | ICD-10-CM

## 2018-11-18 DIAGNOSIS — I619 Nontraumatic intracerebral hemorrhage, unspecified: Secondary | ICD-10-CM | POA: Insufficient documentation

## 2018-11-18 DIAGNOSIS — F319 Bipolar disorder, unspecified: Secondary | ICD-10-CM

## 2018-11-18 DIAGNOSIS — I152 Hypertension secondary to endocrine disorders: Secondary | ICD-10-CM

## 2018-11-18 LAB — COMPREHENSIVE METABOLIC PANEL
ALT: 17 U/L (ref 0–44)
AST: 19 U/L (ref 15–41)
Albumin: 4.2 g/dL (ref 3.5–5.0)
Alkaline Phosphatase: 97 U/L (ref 38–126)
Anion gap: 12 (ref 5–15)
BUN: 18 mg/dL (ref 6–20)
CO2: 23 mmol/L (ref 22–32)
Calcium: 9.7 mg/dL (ref 8.9–10.3)
Chloride: 103 mmol/L (ref 98–111)
Creatinine, Ser: 0.85 mg/dL (ref 0.44–1.00)
GFR calc Af Amer: >60 mL/min (ref >60)
GFR calc non Af Amer: >60 mL/min (ref >60)
Glucose, Bld: 109 mg/dL — ABNORMAL HIGH (ref 70–99)
Potassium: 4 mmol/L (ref 3.5–5.1)
Sodium: 138 mmol/L (ref 135–145)
Total Bilirubin: 0.5 mg/dL (ref 0.3–1.2)
Total Protein: 7.1 g/dL (ref 6.5–8.1)

## 2018-11-18 LAB — CBC WITH DIFFERENTIAL/PLATELET
Abs Immature Granulocytes: 0.04 10*3/uL (ref 0.00–0.07)
Basophils Absolute: 0.1 10*3/uL (ref 0.0–0.1)
Basophils Relative: 1 %
Eosinophils Absolute: 0.1 10*3/uL (ref 0.0–0.5)
Eosinophils Relative: 1 %
HCT: 41.9 % (ref 36.0–46.0)
Hemoglobin: 14 g/dL (ref 12.0–15.0)
Immature Granulocytes: 0 %
Lymphocytes Relative: 22 %
Lymphs Abs: 2.2 10*3/uL (ref 0.7–4.0)
MCH: 29 pg (ref 26.0–34.0)
MCHC: 33.4 g/dL (ref 30.0–36.0)
MCV: 86.7 fL (ref 80.0–100.0)
Monocytes Absolute: 0.9 10*3/uL (ref 0.1–1.0)
Monocytes Relative: 9 %
Neutro Abs: 6.8 10*3/uL (ref 1.7–7.7)
Neutrophils Relative %: 67 %
Platelets: 274 10*3/uL (ref 150–400)
RBC: 4.83 MIL/uL (ref 3.87–5.11)
RDW: 12.4 % (ref 11.5–15.5)
WBC: 10 10*3/uL (ref 4.0–10.5)
nRBC: 0 % (ref 0.0–0.2)

## 2018-11-18 MED ORDER — ONDANSETRON HCL 4 MG PO TABS: 4.0000 mg | ORAL_TABLET | Freq: Three times a day (TID) | ORAL | 0 refills | Status: DC | PRN

## 2018-11-18 MED ORDER — SENNOSIDES-DOCUSATE SODIUM 8.6-50 MG PO TABS: 2.0000 | ORAL_TABLET | Freq: Every day | ORAL | 1 refills | Status: DC

## 2018-11-18 MED ORDER — METOPROLOL SUCCINATE ER 25 MG PO TB24: 25.0000 mg | ORAL_TABLET | Freq: Every day | ORAL | 1 refills | Status: DC

## 2018-11-18 MED ORDER — PANTOPRAZOLE SODIUM 40 MG PO TBEC: 40.0000 mg | DELAYED_RELEASE_TABLET | Freq: Every day | ORAL | 1 refills | Status: DC

## 2018-11-18 MED ORDER — BENAZEPRIL HCL 10 MG PO TABS: 10.0000 mg | ORAL_TABLET | Freq: Two times a day (BID) | ORAL | 1 refills | Status: DC

## 2018-11-18 NOTE — Progress Notes (Signed)
Patient very upset.  States wants to go home.  Does not feel the need to stay "with the program" for two weeks "without my family".  States feels lonely and contrite without family around here.  Is not aware of safety needs, but completely focused on "leaving before 5 pm".  Romie Minus, PA, aware of her status and that she wants to leave AMA.

## 2018-11-18 NOTE — Progress Notes (Signed)
   Post Hospital Discharge Chart Review  11/18/2018  Date of Admission: 11/14/2018 Date of Discharge: 11/17/2018   Attending Physician:  Micki Riley, MD, Stroke MD Consultant(s):   None Patient's PCP:  Dettinger, Elige Radon, MD   DISCHARGE DIAGNOSIS: Intraparenchymal hematoma within the left parietal lobe Active Problems:   ICH (intracerebral hemorrhage) (HCC)   DISCHARGE PLAN  Disposition:  Transfer to [facility] for ongoing PT, OT and ST  No antithrombotic secondary to hemorrhage.  Recommend ongoing risk factor control by Primary Care Physician at time of discharge from inpatient rehabilitation.  Follow-up Dettinger, Elige Radon, MD in 2 weeks following discharge from rehab.  Follow-up in [facility] in 4 weeks following discharge from rehab, office to schedule an appointment.   Follow up MRI with and without contrast with susceptibility-weighted imaging might be helpful in 4-6 weeks.   A post discharge chart review was performed today and further action regarding Transitional Care Management is not necessary at this time. We will follow up with Wendy Collier when after she is discharged from Inpatient Rehab.  Demetrios Loll, RN-BC, BSN Nurse Care Manager [facility] [phone]

## 2018-11-18 NOTE — Progress Notes (Signed)
Attempted to talk to patient and husband about need for continued monitoring as well as need for therapy due to weakness, fall risk with injury as well as poor awareness of deficits. Patient refuses to be a "prisoner here for 2 weeks". Husband educated on need for hands assist with all activity as well as need for assistance with meds. They were advised to follow up with primary MD in next 1-2 days for BP check as well as for order for follow up therapy. Patient left AMA.

## 2018-11-18 NOTE — Progress Notes (Signed)
[place] PHYSICAL MEDICINE & REHABILITATION PROGRESS NOTE  Subjective/Complaints: Patient seen laying in bed this morning.  She states she slept well overnight.  She slept fairly per sleep chart.  She states she is ready begin therapies.  ROS: Denies CP, shortness of breath, nausea, vomiting, diarrhea.  Objective: Vital Signs: Blood pressure (!) 148/87, pulse 79, temperature 98.6 F (37 C), temperature source Oral, resp. rate 18, height 5\' 6"  (1.676 m), weight 72.7 kg, last menstrual period 08/28/2016, SpO2 99 %. Ct Head Wo Contrast  Result Date: 11/17/2018 CLINICAL DATA:  Follow-up examination for CVA. EXAM: CT HEAD WITHOUT CONTRAST TECHNIQUE: Contiguous axial images were obtained from the base of the skull through the vertex without intravenous contrast. COMPARISON:  Prior CT and MRI from 11/14/2018. FINDINGS: Brain: Lobar intraparenchymal hemorrhage centered at the parasagittal left parietal lobe not significantly changed in size measuring 5.3 x 5.1 x 3.5 cm on today's exam. Associated surrounding vasogenic edema with mild regional mass effect, mildly increased from previous. Associated 5 mm left-to-right shift, progressed from previous. Left lateral ventricle partially effaced. No hydrocephalus or ventricular trapping. No intraventricular extension of hemorrhage. Associated subdural extension with trace subdural blood along the posterior falx, measuring up to 3 mm in maximal thickness, similar to previous. No new intracranial hemorrhage. No other acute large vessel territory infarct. Vascular: No hyperdense vessel. Skull: Scalp soft tissues and calvarium within normal limits. Sinuses/Orbits: Globes and orbital soft tissues within normal limits. Paranasal sinuses are clear. Trace left mastoid effusion noted. Other: None. IMPRESSION: 1. No significant interval change in size of left parietal lobe intraparenchymal hemorrhage, but with worsened associated vasogenic edema and regional mass effect.  Associated 5 mm of left-to-right shift, progressed from previous. 2. Associated subdural extension with trace subdural blood along the posterior falx, similar to previous. 3. No other new acute intracranial abnormality. Electronically Signed   By: Rise MuBenjamin  McClintock M.D.   On: 11/17/2018 21:38   Recent Labs    11/18/18 0403  WBC 10.0  HGB 14.0  HCT 41.9  PLT 274   Recent Labs    11/18/18 0403  NA 138  K 4.0  CL 103  CO2 23  GLUCOSE 109*  BUN 18  CREATININE 0.85  CALCIUM 9.7    Physical Exam: BP (!) 148/87 (BP Location: Left Arm)   Pulse 79   Temp 98.6 F (37 C) (Oral)   Resp 18   Ht 5\' 6"  (1.676 m)   Wt 72.7 kg   LMP 08/28/2016 (Approximate) Comment: Pt reports being unknown when she had her last period, but had spotting two days ago.   SpO2 99%   BMI 25.87 kg/m  Constitutional: No distress . Vital signs reviewed. HENT: Normocephalic.  Atraumatic. Eyes: EOMI. No discharge. Cardiovascular: No JVD. Respiratory: Normal effort. GI: Non-distended. Musc: No edema or tenderness in extremities. Neurological: She isalertand oriented x3 Able to follow commands without difficulty Motor: Left upper extremity/left lower extremity: 5/5 proximal distal Right upper extremity/right lower extremity: 4-4+/5 proximal to distal Skin: Skin iswarm.  Psychiatric: She has anormal mood and affect.  Assessment/Plan: 1. Functional deficits secondary to left parietal intraparenchymal hemorrhage which require 3+ hours per day of interdisciplinary therapy in a comprehensive inpatient rehab setting.  Physiatrist is providing close team supervision and 24 hour management of active medical problems listed below.  Physiatrist and rehab team continue to assess barriers to discharge/monitor patient progress toward functional and medical goals  Care Tool:  Bathing  Bathing assist       Upper Body Dressing/Undressing Upper body dressing        Upper body assist       Lower Body Dressing/Undressing Lower body dressing            Lower body assist       Toileting Toileting    Toileting assist Assist for toileting: Minimal Assistance - Patient > 75%     Transfers Chair/bed transfer  Transfers assist     Chair/bed transfer assist level: Minimal Assistance - Patient > 75%     Locomotion Ambulation   Ambulation assist              Walk 10 feet activity   Assist           Walk 50 feet activity   Assist           Walk 150 feet activity   Assist           Walk 10 feet on uneven surface  activity   Assist           Wheelchair     Assist               Wheelchair 50 feet with 2 turns activity    Assist            Wheelchair 150 feet activity     Assist            Medical Problem List and Plan: 1.Functional deficitssecondary to left parietal intraparenchymal hemorrhage  Begin CIR evaluations  Notes reviewed - stroke, CT head personally reviewed, worsening edema, labs reviewed. Discussed with PA. 2. Antithrombotics: -DVT/anticoagulation:Mechanical:Sequential compression devices, below kneeBilateral lower extremities  CBC within normal range on 5/26 -antiplatelet therapy: N/A 3.Chronic pain pain Management:Continue tizanidine at bedtime with tramadol as needed 4. Mood:LCSW to follow for evaluation and support -antipsychotic agents: On Rexulti. 5. Neuropsych: This patientiscapable of making decisions on herown behalf. 6. Skin/Wound Care:Routine pressure relief measures. 7. Fluids/Electrolytes/Nutrition:Monitor I's and O's.   BMP within acceptable range on 5/26 8.HTN:   Continue Lotensin and metoprolol.  Monitor with increased mobility 9.Bipolar disorder/panic attacks: Continue Cymbalta,Rexulti. Xanax 0.5 mg tid prn and ambien 10 mg HS PTA.  10.Nausea/vomiting: Scheduled Zofran for now.Continue Protonix. 11.  ADHD/Chronic fatigue: Was on Adderall bid PTA.  LOS: 1 days A FACE TO FACE EVALUATION WAS PERFORMED   Karis Juba 11/18/2018, 9:12 AM

## 2018-11-18 NOTE — Progress Notes (Addendum)
Brief note:  Please see PA note, discharge summary, as well as progress note from today.  Patient and husband decided to leave AMA after lengthy attempts to educate patient and family.

## 2018-11-18 NOTE — Evaluation (Signed)
Speech Language Pathology Assessment and Plan  Patient Details  Name: Wendy Collier MRN: [id number] Date of Birth: 05-Apr-1966  SLP Diagnosis: Cognitive Impairments  Rehab Potential: Excellent ELOS: 2 weeks    Today's Date: 11/18/2018 SLP Individual Time: 5732-2025 SLP Individual Time Calculation (min): 55 min   Problem List:  Patient Active Problem List   Diagnosis Date Noted  . Intracerebral hemorrhage 11/18/2018  . Attention deficit hyperactivity disorder (ADHD)   . Bipolar 1 disorder ([place])   . Essential hypertension   . Intraparenchymal hematoma of brain ([place]) 11/17/2018  . ICH (intracerebral hemorrhage) ([place]) 11/14/2018  . Hypothyroidism 09/10/2016  . 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 Anxiety and depression 09/10/2016   Past Medical History:  Past Medical History:  Diagnosis Date  . Anxiety disorder    with panic attacks.   . Arthritis   . Bipolar 1 disorder ([place])   . Chronic fatigue   . Chronic pain    neck/back with spasms.   . Depression   . Fibromyalgia   . Frequent headaches   . Thyroid disease    Hypothyroidism    Past Surgical History:  Past Surgical History:  Procedure Laterality Date  . ANTERIOR CERVICAL DECOMP/DISCECTOMY FUSION    . BACK SURGERY    . KNEE SURGERY      Assessment / Plan / Recommendation Clinical Impression Patient is a 53 year old female with history of chronic pain, chronic fatigue, fibromyalgia, bipolar disorder with anxiety and depression; who was admitted on 11/14/2018 with 1 day history of headaches and confusion followed by visual changes on right and right lower extremity weakness progressing to right upper extremity weakness. UDS positive for amphetamines and benzodiazepines. CTAof head done revealing lobar IPH in left parietal lobe with moderate edema and mass-effect on left occipital lobe and small amount of subdural extension along posterior falx cerebri. No aneurysm or vascular malformation noted. MRV of brain was  negative for dural venous sinus thrombosis and no change in left parietal lobe noted. Recommendations to repeat follow-up MRI brain with contrast in 4 to 6 weeks.She was started on Cardene drip to keep SBP less than 150. She reported drinking energy drinks daily. Neurology felt that intraparenchymal hemorrhage likely due to excessive caffeine and sympathomimetics--Monster drinks while on Adderall.Her headaches are improving and patient reported history of hypertension however had stopped taking medicationsfor a few days--SBPgoal<160per neurology.Patient with resultant mild right-sided weakness with sensory deficits and hemianopsia affecting mobility and ADLs. CIR recommended due to functional deficits and patient admitted 11/17/18.   Patient administered the Cognistat and demonstrates mild impairments with calculations and mild-moderate impairments in short-term recall. Informally, mild deficits in attention were also noted. Patient demonstrated delayed processing but followed multi-step commands with extra time. Language also appeared [facility] during all naming tasks, however, suspect patient may also have some higher-level word-finding deficits, especially when fatigued. Patient would benefit from skilled SLP intervention to maximize her cognitive-linguistic function and overall functional independence prior to discharge.    Skilled Therapeutic Interventions          Administered a cognitive-linguistic evaluation, please see above for details. Educated patient on current cognitive deficits and goals of skilled SLP intervention, she verbalized understanding and agreement. Initiated functional medication management task with patient requiring Mod verbal cues to recall her current medications and their functions. Task will be completed during next session.    SLP Assessment  Patient will need skilled Speech Lanaguage Pathology Services during CIR admission    Recommendations  Recommendations for  Other  Services: Neuropsych consult Patient destination: Home Follow up Recommendations: 24 hour supervision/assistance;Home Health SLP;Outpatient SLP Equipment Recommended: None recommended by SLP    SLP Frequency 3 to 5 out of 7 days   SLP Duration  SLP Intensity  SLP Treatment/Interventions 2 weeks  Minumum of 1-2 x/day, 30 to 90 minutes  Cognitive remediation/compensation;Environmental controls;Internal/external aids;Therapeutic Activities;Patient/family education;Functional tasks;Cueing hierarchy    Pain No/Denies Pain  Short Term Goals: Week 1: SLP Short Term Goal 1 (Week 1): Patient will demonstrate complex problem solving with familair tasks supervision verbal cues.  SLP Short Term Goal 2 (Week 1): Patient will demonstrate selective attention to functional tasks in a minimally distracting enviornment for ~30 minutes with supervision verbal cues.  SLP Short Term Goal 3 (Week 1): Patient will utilize memory compensatory strategies to recall new, daily information with Min A verbal cues.  SLP Short Term Goal 4 (Week 1): Patient will utilize her RUE during functional tasks with supervision verbal cues.   Refer to Care Plan for Long Term Goals  Recommendations for other services: Neuropsych  Discharge Criteria: Patient will be discharged from SLP if patient refuses treatment 3 consecutive times without medical reason, if treatment goals not met, if there is a change in medical status, if patient makes no progress towards goals or if patient is discharged from hospital.  The above assessment, treatment plan, treatment alternatives and goals were discussed and mutually agreed upon: by patient  Krissie Merrick 11/18/2018, 1:16 PM

## 2018-11-18 NOTE — Evaluation (Signed)
Physical Therapy Assessment and Plan  Patient Details  Name: Wendy Collier MRN: [id number] Date of Birth: 01-09-1966  PT Diagnosis: Abnormality of gait, Ataxia, Hemiparesis dominant, Impaired cognition and Impaired sensation Rehab Potential: Good ELOS: planning 10-14 days, but pt reports plans to leave AMA today   Today's Date: 11/18/2018 PT Individual Time: 1330-1445 PT Individual Time Calculation (min): 75 min    Problem List:  Patient Active Problem List   Diagnosis Date Noted  . Intracerebral hemorrhage 11/18/2018  . Attention deficit hyperactivity disorder (ADHD)   . Bipolar 1 disorder ([place])   . Essential hypertension   . Intraparenchymal hematoma of brain ([place]) 11/17/2018  . ICH (intracerebral hemorrhage) ([place]) 11/14/2018  . Hypothyroidism 09/10/2016  . 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 Anxiety and depression 09/10/2016    Past Medical History:  Past Medical History:  Diagnosis Date  . Anxiety disorder    with panic attacks.   . Arthritis   . Bipolar 1 disorder ([place])   . Chronic fatigue   . Chronic pain    neck/back with spasms.   . Depression   . Fibromyalgia   . Frequent headaches   . Thyroid disease    Hypothyroidism    Past Surgical History:  Past Surgical History:  Procedure Laterality Date  . ANTERIOR CERVICAL DECOMP/DISCECTOMY FUSION    . BACK SURGERY    . KNEE SURGERY      Assessment & Plan Clinical Impression: Patient is a 53 y.o. year old female history of chronic pain, chronic fatigue, fibromyalgia, bipolar disorder with anxiety and depression; who was admitted on 11/14/2018 with 1 day history of headaches and confusion followed by visual changes on right and right lower extremity weakness progressing to right upper extremity weakness. UDS positive for amphetamines and benzodiazepines. 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 No aneurysm or vascular malformation noted. MRV of brain was negative for dural venous sinus thrombosis and no change in left parietal lobe noted. Recommendations to repeat follow-up MRI brain with contrast in 4 to 6 weeks.She was started on Cardene drip to keep SBP less than 150. She reported drinking energy drinks daily. Neurology felt that intraparenchymal hemorrhage likely due to excessive caffeine and sympathomimetics--Monster drinks while on Adderall.Her headaches are improving and patient reported history of hypertension however had stopped taking medicationsfor a few days--SBPgoal<160per neurology.Patient with resultant mild right-sided weakness with sensory deficits and hemianopsia affecting mobility and ADLs.   Patient transferred to CIR on 11/17/2018 .  Patient currently requires mod to max with mobility secondary to impaired timing and sequencing, abnormal tone, ataxia and decreased coordination, hemianopsia, decreased attention to right and decreased awareness, decreased problem solving and decreased safety awareness.  Prior to hospitalization, patient was independent  with mobility and lived with Spouse in a House home.  Home access is 2Stairs to enter.  Patient will benefit from skilled PT intervention to maximize safe functional mobility, minimize fall risk and decrease caregiver burden for planned discharge home with 24 hour assist.  Anticipate patient will benefit from follow up [facility] at discharge.  PT - End of Session Activity Tolerance: Improving;Tolerates 30+ min activity with multiple rests Endurance Deficit: Yes PT Assessment Rehab Potential (ACUTE/IP ONLY): Good PT Patient demonstrates impairments in the following area(s): Balance;Perception;Safety;Sensory;Endurance;Motor PT Transfers Functional Problem(s): Bed Mobility;Car;Furniture;Bed to Chair;Floor PT Locomotion Functional Problem(s): Ambulation;Stairs PT Plan PT Intensity: Minimum of 1-2  x/day ,45 to 90 minutes PT Frequency: 5 out of 7 days PT Duration Estimated Length of Stay: planning 10-14 days, but pt reports plans to leave AMA today PT Treatment/Interventions: Ambulation/gait training;DME/adaptive equipment instruction;Neuromuscular re-education;Stair training;UE/LE Strength taining/ROM;Balance/vestibular training;Cognitive remediation/compensation;Functional mobility training;Patient/family education;Therapeutic Exercise;Visual/perceptual remediation/compensation;UE/LE Coordination activities;Therapeutic Activities PT Transfers Anticipated Outcome(s): mod I PT Locomotion Anticipated Outcome(s): S PT Recommendation Follow Up Recommendations: Home health PT;24 hour supervision/assistance Patient destination: Home Equipment Recommended: Rolling walker with 5" wheels  Skilled Therapeutic Intervention Patient in supine and agreeable to PT.  Reports ready to move.  Able to give history and home environment information and participated with evaluation procedures in supine.  Supine to sit with S from elevated HOB and using rail.  Performed seated reaching with close S to min a.  Sit to stand to RW mod A and ambulated x 50' with mod to max A for balance recovery when R foot not stepping and anterior LOB with walker too far out.  Patient consistently needing assist for lateral weight shift to improve  R foot clearance with stepping and needing assist for balance.  In w/c propelled about 15' with A for R UE to push consistently and to release safely between pushes.  Patient negotiated 2 steps with rail and mod to max A for R foot placement on steps, cues for sequence and A for balance.  Patient assisted to ortho gym for car transfer mod A and gait on ramp mod A with wall rail.  Performed Berg balance assessment as noted below.  Patient returned to supine min A for R LE and left with call bell in reach and bed alarm activated.    PT  Evaluation Precautions/Restrictions Precautions Precautions: Fall Restrictions Weight Bearing Restrictions: No Pain Pain Assessment Pain Score: 0-No pain Home Living/Prior Functioning Home Living Available Help at Discharge: Family;Available 24 hours/day Type of Home: House Home Access: Stairs to enter [employer] of Steps: 2 Entrance Stairs-Rails: None Home Layout: One level Bathroom Shower/Tub: Chiropodist: Standard  Lives With: Spouse Prior Function Level of Independence: Independent with basic ADLs;Independent with transfers;Independent with gait  Able to Take Stairs?: Yes Driving: Yes Vocation: On disability Vision/Perception  Vision - Assessment Eye Alignment: Within Functional Limits Ocular Range of Motion: Within Functional Limits Alignment/Gaze Preference: Within Defined Limits Tracking/Visual Pursuits: Able to track stimulus in all quads without difficulty Saccades: Within functional limits Convergence: Within functional limits Additional Comments: 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  Perception Perception: Impaired Inattention/Neglect: Does not attend to right visual field Praxis Praxis: Impaired Praxis Impairment Details: Motor planning;Ideomotor  Cognition Overall Cognitive Status: Impaired/Different from baseline Arousal/Alertness: Awake/alert Orientation Level: Oriented X4 Attention: Selective Sustained Attention: Appears intact Sustained Attention Impairment: Verbal complex;Functional complex Selective Attention: Impaired Selective Attention Impairment: Functional complex Memory: Impaired Memory Impairment: Storage deficit;Decreased short term memory Decreased Short Term Memory: Verbal basic;Functional basic Awareness: Appears intact Problem Solving: Impaired Problem Solving Impairment: Functional basic Safety/Judgment: Appears intact Sensation Sensation Light Touch: Impaired Detail Light Touch  Impaired Details: Impaired RLE;Impaired RUE Proprioception: Impaired Detail Proprioception Impaired Details: Impaired RUE;Impaired RLE Stereognosis: Impaired Detail Stereognosis Impaired Details: Impaired RUE Coordination Gross Motor Movements are Fluid and Coordinated: No Fine Motor Movements are Fluid and Coordinated: No Coordination and Movement Description: decreased coordination on R compared to L with toe tapping, heel to shin, FNF and pon/sup Finger Nose Finger Test: dysmetry Motor  Motor Motor: Hemiplegia;Ataxia Motor - Skilled Clinical Observations: R sided weakness, decreased coordination  Mobility Bed Mobility Bed Mobility:  Supine to Sit;Sit to Supine Supine to Sit: Minimal Assistance - Patient > 75% Sit to Supine: Minimal Assistance - Patient > 75% Transfers Transfers: Sit to Stand;Stand to Sit;Stand Pivot Transfers Sit to Stand: Moderate Assistance - Patient 50-74% Stand to Sit: Minimal Assistance - Patient > 75% Stand Pivot Transfers: Moderate Assistance - Patient 50 - 74% Stand Pivot Transfer Details: Verbal cues for technique;Manual facilitation for weight shifting;Verbal cues for precautions/safety;Verbal cues for safe use of DME/AE Stand Pivot Transfer Details (indicate cue type and reason): cues for foot placement, sequencing Transfer (Assistive device): Rolling walker(versus HHA) Locomotion  Gait Ambulation: Yes Gait Assistance: Moderate Assistance - Patient 50-74%;Maximal Assistance - Patient 25-49% Gait Distance (Feet): 50 Feet Assistive device: Rolling walker Gait Assistance Details: Verbal cues for precautions/safety;Verbal cues for sequencing;Verbal cues for safe use of DME/AE;Verbal cues for technique;Verbal cues for gait pattern;Manual facilitation for placement Gait Assistance Details: assist to prevent forward LOB when R foot taking too long to step and pt with decreased awareness/attention/safety Gait Gait: Yes Gait Pattern: Decreased hip/knee  flexion - right;Decreased dorsiflexion - right;Decreased stride length;Decreased stance time - right;Shuffle;Scissoring;Poor foot clearance - right Stairs / Additional Locomotion Stairs: Yes Stairs Assistance: Maximal Assistance - Patient 25 - 49% Stair Management Technique: Step to pattern;Two rails Number of Stairs: 2 Height of Stairs: 6 Ramp: Moderate Assistance - Patient 50 - 74%(with wall rail) Wheelchair Mobility Wheelchair Mobility: Yes Wheelchair Assistance: Moderate Assistance - Patient 50 - 74% Wheelchair Propulsion: Both upper extremities Wheelchair Parts Management: Needs assistance Distance: 60'  Trunk/Postural Assessment  Cervical Assessment Cervical Assessment: Within Functional Limits Thoracic Assessment Thoracic Assessment: Within Functional Limits Lumbar Assessment Lumbar Assessment: Within Functional Limits Postural Control Postural Control: Deficits on evaluation Righting Reactions: delayed Protective Responses: delayed  Balance Balance Balance Assessed: Yes Standardized Balance Assessment Standardized Balance Assessment: Berg Balance Test Berg Balance Test Sit to Stand: Needs minimal aid to stand or to stabilize Standing Unsupported: Unable to stand 30 seconds unassisted Sitting with Back Unsupported but Feet Supported on Floor or Stool: Able to sit 2 minutes under supervision Stand to Sit: Needs assistance to sit Transfers: Needs one person to assist Standing Unsupported with Eyes Closed: Able to stand 3 seconds Standing Ubsupported with Feet Together: Needs help to attain position and unable to hold for 15 seconds From Standing, Reach Forward with Outstretched Arm: Loses balance while trying/requires external support From Standing Position, Pick up Object from Floor: Unable to try/needs assist to keep balance From Standing Position, Turn to Look Behind Over each Shoulder: Needs assist to keep from losing balance and falling Turn 360 Degrees: Needs  assistance while turning Standing Unsupported, Alternately Place Feet on Step/Stool: Needs assistance to keep from falling or unable to try Standing Unsupported, One Foot in Front: Loses balance while stepping or standing Standing on One Leg: Unable to try or needs assist to prevent fall Total Score: 7 Static Sitting Balance Static Sitting - Level of Assistance: 5: Stand by assistance Dynamic Sitting Balance Dynamic Sitting - Level of Assistance: 4: Min assist Static Standing Balance Static Standing - Level of Assistance: 4: Min assist Dynamic Standing Balance Dynamic Standing - Level of Assistance: 3: Mod assist;2: Max assist Dynamic Standing - Balance Activities: Lateral lean/weight shifting;Forward lean/weight shifting Dynamic Standing - Comments: stepping Extremity Assessment  RUE Assessment RUE Assessment: Exceptions to [facility] Active Range of Motion (AROM) Comments: WFL General Strength Comments: 4/5  RUE Body System: Neuro Brunstrum levels for arm and hand: Arm;Hand Brunstrum level for arm: Stage V  Relative Independence from Synergy Brunstrum level for hand: Stage VI Isolated joint movements RUE Tone RUE Tone: Modified Ashworth Body Part - Modified Ashworth Scale: Elbow;Wrist;Fingers;Thumb Elbow - Modified Ashworth Scale for Grading Hypertonia RUE: Slight increase in muscle tone, manifested by a catch and release or by minimal resistance at the end of the range of motion when the affected part(s) is moved in flexion or extension Wrist - Modified Ashworth Scale for Grading Hypertonia RUE: No increase in muscle tone Fingers - Modified Ashworth Scale for Grading Hypertonia RUE: No increase in muscle tone Thumb - Modified Ashworth Scale for Grading Hypertonia RUE: No increase in muscle tone LUE Assessment LUE Assessment: Within Functional Limits RLE Assessment RLE Assessment: Exceptions to [facility] Passive Range of Motion (PROM) Comments: limited ankle DF about 10 degrees from  neutral Active Range of Motion (AROM) Comments: hip flexion WFL, knee extension/flexion limited due to weakness/increased tone General Strength Comments: lifts antigravity, synergystic movements  RLE Tone RLE Tone: Hypertonic;Modified Ashworth Body Part - Modified Ashworth Scale: Quadriceps;Hamstrings;Gastrocnemius Hamstrings - Modified Ashworth Scale for Grading Hypertonia RLE: Slight increase in muscle tone, manifested by a catch, followed by minimal resistance throughout the remainder (less than half) of the ROM QUADRICEPS - Modified Ashworth Scale for Grading Hypertonia RLE: More marked increase in muscle tone through most of the ROM, but affected part(s) easily moved GASTROCNEMIUS - Modified Ashworth Scale for Grading Hypertonia RLE: More marked increase in muscle tone through most of the ROM, but affected part(s) easily moved LLE Assessment LLE Assessment: Within Functional Limits    Refer to Care Plan for Long Term Goals  Recommendations for other services: None   Discharge Criteria: Patient will be discharged from PT if patient refuses treatment 3 consecutive times without medical reason, if treatment goals not met, if there is a change in medical status, if patient makes no progress towards goals or if patient is discharged from hospital.  The above assessment, treatment plan, treatment alternatives and goals were discussed and mutually agreed upon: by patient  Jamison Oka, PT 11/18/2018, 4:39 PM

## 2018-11-18 NOTE — Progress Notes (Signed)
Patient found sitting on floor.  She states she was "sliding off the mattress" and "decided to sit on the floor instead".  Pam, PA, aware of situation and wishes to speak with husband on his arrival to pick patient up.  Daughter has called several times asking questions re:  The origin of the patient's stroke, medications she is on, and states that she has not been in contact with any physician since her arrival to the hospital.  She also states the patient's husband has also not heard from the neurologist or any other MD since her arrival to the intensive care unit.  This information was passed on to the PA.

## 2018-11-18 NOTE — Progress Notes (Signed)
Social Work  Social Work Assessment and Plan  Patient Details  Name: Carlisle Timothy MRN: [id number] Date of Birth: [birth year]  Today's Date: 11/18/2018  Problem List:  Patient Active Problem List   Diagnosis Date Noted  . Intracerebral hemorrhage 11/18/2018  . Attention deficit hyperactivity disorder (ADHD)   . Bipolar 1 disorder ([facility])   . Essential hypertension   . Intraparenchymal hematoma of brain ([facility]) 11/17/2018  . ICH (intracerebral hemorrhage) ([facility]) 11/14/2018  . Hypothyroidism 09/10/2016  . 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 Anxiety and depression 09/10/2016   Past Medical History:  Past Medical History:  Diagnosis Date  . Anxiety disorder    with panic attacks.   . Arthritis   . Bipolar 1 disorder ([facility])   . Chronic fatigue   . Chronic pain    neck/back with spasms.   . Depression   . Fibromyalgia   . Frequent headaches   . Thyroid disease    Hypothyroidism    Past Surgical History:  Past Surgical History:  Procedure Laterality Date  . ANTERIOR CERVICAL DECOMP/DISCECTOMY FUSION    . BACK SURGERY    . KNEE SURGERY     Social History:  reports that she has never smoked. She has never used smokeless tobacco. She reports that she does not drink alcohol or use drugs.  Family / Support Systems Marital Status: Married Patient Roles: Spouse, Parent, Other (Comment)(Step Mom) Spouse/Significant Other: David-husband [phone] Children: Morrie Sheldon Philpott-daughter [phone]-cell in [place] Other Supports: Step son local Anticipated Caregiver: Husband Ability/Limitations of Caregiver: Husband is not working right now due to COVID 19 and able to assist Caregiver Availability: 24/7 Family Dynamics: Close with both children who are involved and supportive. They have friends and extended family members who are willing to assist.  Social History Preferred language: English Religion:  Cultural Background: No issues Education: HS Read: Yes Write: Yes Employment  Status: Disabled Marine scientist Issues: No issues Guardian/Conservator: None-according to MD pt is capable of making her own decisions while here   Abuse/Neglect Abuse/Neglect Assessment Can Be Completed: Yes Physical Abuse: Denies Verbal Abuse: Denies Sexual Abuse: Denies Exploitation of patient/patient's resources: Denies Self-Neglect: Denies  Emotional Status Pt's affect, behavior and adjustment status: Pt is motivated to do well and recover from her stroke. She has made some progress already and is encouraged. She has always been independent and able to take care of herself even with her health issues. Recent Psychosocial Issues: other health issues-managed by PCP Psychiatric History: History of bipolar, depression/anxiety-takes medications and feels they are helpful. She would benefit from seeing neuro-psych while here due to young age and mental health history Substance Abuse History: No issues  Patient / Family Perceptions, Expectations & Goals Pt/Family understanding of illness & functional limitations: Pt can explain her stroke and deficits, she doe talk with the MD and feels they are providing her with the answers to her questions and she has a clear understanding of her plan going forward. Premorbid pt/family roles/activities: Wife, mother, friend, church member, etc Anticipated changes in roles/activities/participation: resume Pt/family expectations/goals: Pt states: " I want to be able to take care of myself before I leave here, my husband will assist if needed."  Husband states: " I will help she would help me."  [employer]: None Premorbid Home Care/DME Agencies: Other (Comment)(has rw and cane) Transportation available at discharge: Husband Resource referrals recommended: Neuropsychology, Support group (specify)  Discharge Planning Living Arrangements: Spouse/significant other Support Systems: Spouse/significant other, Children,  Other relatives,  Friends/neighbors, Church/faith community Type of Residence: Private residence Insurance Resources: [employer] Financial Resources: SSD, Family Support Financial Screen Referred: No Living Expenses: [employer] Money Management: Patient, Spouse Does the patient have any problems obtaining your medications?: No Home Management: Self Patient/Family Preliminary Plans: Return home with husband who is not employed at this time due to COVID 19 and can assist her. She will have friends who can come and check on once he goes back to work. Will await therapy team evaluations and work on discharge needs. Social Work Anticipated Follow Up Needs: HH/OP, Support Group  Clinical Impression Pleasant female who is motivated to recover and do well here. Her husband is not working due to [employer]yland GroupCOVID and can assist at discharge. DO feel she would benefit from seeing neuro-psych while here. Await therapy team evaluations.  Lucy Chrisupree, Glenys Snader G 11/18/2018, 10:24 AM

## 2018-11-18 NOTE — Care Management Note (Signed)
[facility]patient [facility] Individual Statement of Services  Patient Name:  Wendy Collier  Date:  11/18/2018  Welcome to the [facility]patient [facility].  Our goal is to provide you with an individualized program based on your diagnosis and situation, designed to meet your specific needs.  With this comprehensive rehabilitation program, you will be expected to participate in at least 3 hours of rehabilitation therapies Monday-Friday, with modified therapy programming on the weekends.  Your rehabilitation program will include the following services:  Physical Therapy (PT), Occupational Therapy (OT), Speech Therapy (ST), 24 hour per day rehabilitation nursing, Neuropsychology, Case Management (Social Worker), Rehabilitation Medicine, Nutrition Services and Pharmacy Services  Weekly team conferences will be held on Wednesday to discuss your progress.  Your Social Worker will talk with you frequently to get your input and to update you on team discussions.  Team conferences with you and your family in attendance may also be held.  Expected length of stay: 14 days Overall anticipated outcome: independent with device  Depending on your progress and recovery, your program may change. Your Social Worker will coordinate services and will keep you informed of any changes. Your Social Worker's name and contact numbers are listed  below.  The following services may also be recommended but are not provided by the Inpatient [facility]:   Driving Evaluations  Home Health Rehabiltiation Services  Outpatient Rehabilitation Services    Arrangements will be made to provide these services after discharge if needed.  Arrangements include referral to agencies that provide these services.  Your insurance has been verified to be:  Medicare Your primary doctor is:  Geologist, engineering  Pertinent information will be shared with your doctor and your insurance company.  Social Worker:  Dossie Der, SW [phone] or ([phone]  Information discussed with and copy given to patient by: Lucy Chris, 11/18/2018, 9:44 AM

## 2018-11-18 NOTE — IPOC Note (Signed)
Unable to complete

## 2018-11-18 NOTE — Evaluation (Signed)
Occupational Therapy Assessment and Plan  Patient Details  Name: Wendy Collier MRN: [id number] Date of Birth: 25-Jul-1965  OT Diagnosis: ataxia, cognitive deficits and hemiplegia affecting dominant side Rehab Potential: Rehab Potential (ACUTE ONLY): Good ELOS: 14-16 days   Today's Date: 11/18/2018 OT Individual Time: 5643-3295 OT Individual Time Calculation (min): 60 min     Problem List:  Patient Active Problem List   Diagnosis Date Noted  . Intracerebral hemorrhage 11/18/2018  . Attention deficit hyperactivity disorder (ADHD)   . Bipolar 1 disorder ([place])   . Essential hypertension   . Intraparenchymal hematoma of brain ([place]) 11/17/2018  . ICH (intracerebral hemorrhage) ([place]) 11/14/2018  . Hypothyroidism 09/10/2016  . 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 Anxiety and depression 09/10/2016    Past Medical History:  Past Medical History:  Diagnosis Date  . Anxiety disorder    with panic attacks.   . Arthritis   . Bipolar 1 disorder ([place])   . Chronic fatigue   . Chronic pain    neck/back with spasms.   . Depression   . Fibromyalgia   . Frequent headaches   . Thyroid disease    Hypothyroidism    Past Surgical History:  Past Surgical History:  Procedure Laterality Date  . ANTERIOR CERVICAL DECOMP/DISCECTOMY FUSION    . BACK SURGERY    . KNEE SURGERY      Assessment & Plan Clinical Impression: Patient is a 53 y.o. year old female history of chronic pain, chronic fatigue, fibromyalgia, bipolar disorder with anxiety and depression; who was admitted on 11/14/2018 with 1 day history of headaches and confusion followed by visual changes on right and right lower extremity weakness progressing to right upper extremity weakness. UDS positive for amphetamines and benzodiazepines. CTAof head done revealing lobar IPH in left parietal lobe with moderate edema and mass-effect on left occipital lobe and small amount of subdural extension along posterior falx cerebri. No aneurysm or  vascular malformation noted. MRV of brain was negative for dural venous sinus thrombosis and no change in left parietal lobe noted. Recommendations to repeat follow-up MRI brain with contrast in 4 to 6 weeks.She was started on Cardene drip to keep SBP less than 150. She reported drinking energy drinks daily. Neurology felt that intraparenchymal hemorrhage likely due to excessive caffeine and sympathomimetics--Monster drinks while on Adderall.Her headaches are improving and patient reported history of hypertension however had stopped taking medicationsfor a few days--SBPgoal<160per neurology.Patient with resultant mild right-sided weakness with sensory deficits and hemianopsia affecting mobility and ADLs.   Patient transferred to CIR on 11/17/2018 .    Patient currently requires mod with basic self-care skills and basic mobility secondary to muscle weakness, decreased cardiorespiratoy endurance, impaired timing and sequencing, abnormal tone, unbalanced muscle activation, motor apraxia, ataxia, decreased coordination and decreased motor planning, decreased midline orientation, decreased attention to right, decreased motor planning and ideational apraxia, decreased awareness, decreased problem solving, decreased safety awareness, decreased memory and delayed processing and decreased sitting balance, decreased standing balance, decreased postural control, hemiplegia and decreased balance strategies.  Prior to hospitalization, patient could complete ADL with modified independent .  Patient will benefit from skilled intervention to decrease level of assist with basic self-care skills and increase independence with basic self-care skills prior to discharge home with care partner.  Anticipate patient will require intermittent supervision and follow up outpatient.  OT - End of Session Activity Tolerance: Tolerates 30+ min activity with multiple rests Endurance Deficit: Yes OT Assessment Rehab Potential  (ACUTE ONLY): Good OT Patient  demonstrates impairments in the following area(s): Balance;Perception;Safety;Cognition;Sensory;Skin Integrity;Endurance;Motor;Vision;Pain OT Basic ADL's Functional Problem(s): Grooming;Bathing;Dressing;Toileting OT Transfers Functional Problem(s): Toilet;Tub/Shower OT Additional Impairment(s): Fuctional Use of Upper Extremity OT Plan OT Intensity: Minimum of 1-2 x/day, 45 to 90 minutes OT Frequency: 5 out of 7 days OT Duration/Estimated Length of Stay: 14-16 days OT Treatment/Interventions: Balance/vestibular training;Discharge planning;Self Care/advanced ADL retraining;Functional electrical stimulation;Pain management;Therapeutic Activities;UE/LE Coordination activities;Cognitive remediation/compensation;Disease mangement/prevention;Functional mobility training;Patient/family education;Skin care/wound managment;Therapeutic Exercise;Visual/perceptual remediation/compensation;Community reintegration;DME/adaptive equipment instruction;Neuromuscular re-education;Psychosocial support;UE/LE Strength taining/ROM OT Self Feeding Anticipated Outcome(s): mod I  OT Basic Self-Care Anticipated Outcome(s): mod I  OT Toileting Anticipated Outcome(s): mod I  OT Bathroom Transfers Anticipated Outcome(s): mod I  OT Recommendation Recommendations for Other Services: Neuropsych consult Patient destination: Home Follow Up Recommendations: Outpatient OT Equipment Recommended: To be determined   Skilled Therapeutic Intervention OT eval initiated with Ot goals, purpose and role discussed. Self care retraining at shower level. Pt can perform sit to stands with min A with mod cues for proper positioning of right LE. Pt took 6 steps to the w/c with mod A with attention right LE and sustained hip and knee control. Pt performed stand pivot transfer to toilet and then transferred into shower stall with min to mod A with extra time. Pt required mod cues to incorporate right hand in functional  tasks- pt does exhibit overshoot with right hand at times. Pt required extra time to process commands and then carry them out motorically. When pt visually attends to right hand pt with improved performance. Pt does require A with management of hair.   OT Evaluation Precautions/Restrictions  Precautions Precautions: Fall Restrictions Weight Bearing Restrictions: No General Chart Reviewed: Yes Family/Caregiver Present: No  Pain Pain Assessment Pain Score: 0-No pain Home Living/Prior Functioning Home Living Family/patient expects to be discharged to:: Private residence Living Arrangements: Spouse/significant other Available Help at Discharge: Family, Available 24 hours/day Type of Home: House Home Access: Stairs to enter Technical brewer of Steps: 2 Entrance Stairs-Rails: None Home Layout: One level Bathroom Shower/Tub: Chiropodist: Standard  Lives With: Spouse Prior Function Level of Independence: Independent with basic ADLs, Independent with transfers, Independent with gait  Able to Take Stairs?: Yes Driving: Yes Vocation: On disability ADL ADL Grooming: Minimal assistance Where Assessed-Grooming: Sitting at sink Upper Body Bathing: Minimal assistance Where Assessed-Upper Body Bathing: Shower Lower Body Bathing: Minimal assistance Where Assessed-Lower Body Bathing: Shower Upper Body Dressing: Minimal assistance Where Assessed-Upper Body Dressing: Sitting at sink Lower Body Dressing: Moderate assistance Where Assessed-Lower Body Dressing: Sitting at sink Toileting: Moderate assistance Toilet Transfer: Minimal assistance Toilet Transfer Method: Stand pivot Toilet Transfer Equipment: Ambulance person Equipment: Grab bars(3:!) Social research officer, government: Moderate assistance Vision Baseline Vision/History: Wears glasses Wears Glasses: Reading only Vision Assessment?: Yes Eye Alignment: Within Functional Limits Ocular Range of Motion:  Within Functional Limits Alignment/Gaze Preference: Within Defined Limits Tracking/Visual Pursuits: Able to track stimulus in all quads without difficulty Saccades: Within functional limits Convergence: Within functional limits Visual Fields: Right inferior homonymous quadranopsia Depth Perception: Overshoots Additional Comments: 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  Perception  Perception: Impaired Inattention/Neglect: Does not attend to right visual field Praxis Praxis: Impaired Praxis Impairment Details: Motor planning;Ideomotor Cognition Overall Cognitive Status: Impaired/Different from baseline Arousal/Alertness: Awake/alert Orientation Level: Person;Place;Situation Person: Oriented Place: Oriented Situation: Oriented Year: 2020 Month: May Day of Week: Correct Memory: Impaired Memory Impairment: Storage deficit;Decreased short term memory Decreased Short Term Memory: Verbal basic;Functional basic Immediate Memory Recall: Sock;Bed;Blue Memory Recall: Sock;Blue;Bed Memory Recall Sock: Without  Cue Memory Recall Blue: Without Cue Memory Recall Bed: Without Cue Attention: Selective Sustained Attention: Appears intact Sustained Attention Impairment: Verbal complex;Functional complex Selective Attention: Impaired Selective Attention Impairment: Functional complex Awareness: Appears intact Problem Solving: Impaired Problem Solving Impairment: Functional basic Safety/Judgment: Appears intact Sensation Sensation Light Touch: Impaired Detail Light Touch Impaired Details: Impaired RLE;Impaired RUE Proprioception: Impaired Detail Proprioception Impaired Details: Impaired RUE;Impaired RLE Stereognosis: Impaired Detail Stereognosis Impaired Details: Impaired RUE Coordination Gross Motor Movements are Fluid and Coordinated: No Fine Motor Movements are Fluid and Coordinated: No Coordination and Movement Description: decreased coordination on R compared to L with toe  tapping, heel to shin, FNF and pon/sup Finger Nose Finger Test: dysmetry Motor  Motor Motor: Hemiplegia;Ataxia Motor - Skilled Clinical Observations: R sided weakness, decreased coordination Mobility  Transfers Sit to Stand: Minimal Assistance - Patient > 75% Stand to Sit: Minimal Assistance - Patient > 75%  Trunk/Postural Assessment  Cervical Assessment Cervical Assessment: Within Functional Limits Thoracic Assessment Thoracic Assessment: Within Functional Limits Lumbar Assessment Lumbar Assessment: Within Functional Limits Postural Control Postural Control: Deficits on evaluation Righting Reactions: delayed Protective Responses: delayed  Balance Balance Balance Assessed: Yes Static Sitting Balance Static Sitting - Level of Assistance: 5: Stand by assistance Dynamic Sitting Balance Dynamic Sitting - Level of Assistance: 4: Min assist Static Standing Balance Static Standing - Level of Assistance: 4: Min assist Dynamic Standing Balance Dynamic Standing - Level of Assistance: 2: Max assist;3: Mod assist Extremity/Trunk Assessment RUE Assessment RUE Assessment: Exceptions to [facility] Active Range of Motion (AROM) Comments: WFL General Strength Comments: 4/5  RUE Body System: Neuro Brunstrum levels for arm and hand: Arm;Hand Brunstrum level for arm: Stage V Relative Independence from Synergy Brunstrum level for hand: Stage VI Isolated joint movements RUE Tone RUE Tone: Modified Ashworth Body Part - Modified Ashworth Scale: Elbow;Wrist;Fingers;Thumb Elbow - Modified Ashworth Scale for Grading Hypertonia RUE: Slight increase in muscle tone, manifested by a catch and release or by minimal resistance at the end of the range of motion when the affected part(s) is moved in flexion or extension Wrist - Modified Ashworth Scale for Grading Hypertonia RUE: No increase in muscle tone Fingers - Modified Ashworth Scale for Grading Hypertonia RUE: No increase in muscle tone Thumb - Modified  Ashworth Scale for Grading Hypertonia RUE: No increase in muscle tone LUE Assessment LUE Assessment: Within Functional Limits     Refer to Care Plan for Long Term Goals  Recommendations for other services: Neuropsych   Discharge Criteria: Patient will be discharged from OT if patient refuses treatment 3 consecutive times without medical reason, if treatment goals not met, if there is a change in medical status, if patient makes no progress towards goals or if patient is discharged from hospital.  The above assessment, treatment plan, treatment alternatives and goals were discussed and mutually agreed upon: by patient  Nicoletta Ba 11/18/2018, 2:27 PM

## 2018-11-18 NOTE — Progress Notes (Signed)
Alert, denies pain or discomfort, sleep chart in place, slept fair during shift states she has insomnia.Assisted call bell within reach, bed alarm on

## 2018-11-18 NOTE — Progress Notes (Signed)
STROKE TEAM PROGRESS NOTE      SUBJECTIVE (INTERVAL HISTORY) Stroke team was called back to evaluate patient because of concerns about depressed mental status.  However this appears to be transient and she is clearly now much more alert and interactive and likely back to her baseline.  She denies any headaches and states she is wants to get active with participation in the therapies.  She has no complaints at the moment. CT scan of the head done yesterday shows no significant interval change in the left parietal hemorrhage with only slightly worsened vasogenic edema and 5 mm left-to-right shift. OBJECTIVE Vitals:   11/17/18 1913 11/17/18 1922 11/18/18 0459 11/18/18 1443  BP:  (!) 142/91 (!) 148/87 128/89  Pulse:  80 79 92  Resp:  18 18 17   Temp:  98.2 F (36.8 C) 98.6 F (37 C) 98.5 F (36.9 C)  TempSrc:  Oral Oral Oral  SpO2:  97% 99% 99%  Weight: 72.7 kg     Height: 5\' 6"  (1.676 m)       CBC:  Recent Labs  Lab 11/14/18 1450 11/18/18 0403  WBC 10.5 10.0  NEUTROABS 7.2 6.8  HGB 13.9 14.0  HCT 41.1 41.9  MCV 88.0 86.7  PLT 260 274    Basic Metabolic Panel:  Recent Labs  Lab 11/14/18 1450 11/18/18 0403  NA 141 138  K 4.0 4.0  CL 105 103  CO2 24 23  GLUCOSE 101* 109*  BUN 20 18  CREATININE 0.80 0.85  CALCIUM 9.7 9.7    Lipid Panel:     Component Value Date/Time   TRIG 162 (H) 11/16/2018 0210   HgbA1c: No results found for: HGBA1C Urine Drug Screen:     Component Value Date/Time   LABOPIA NONE DETECTED 11/14/2018 1755   COCAINSCRNUR NONE DETECTED 11/14/2018 1755   LABBENZ POSITIVE (A) 11/14/2018 1755   AMPHETMU POSITIVE (A) 11/14/2018 1755   THCU NONE DETECTED 11/14/2018 1755   LABBARB NONE DETECTED 11/14/2018 1755    Alcohol Level No results found for: ETH  IMAGING  Ct Angio Head W Or Wo Contrast 11/14/2018 IMPRESSION:  1. Lobar intraparenchymal hemorrhage within the left parietal lobe with volume of 33 mL. Moderate edema with mild mass effect on  the left occipital lobe.  2. Small amount of subdural extension of hematoma along the posterior falx cerebri.  3. Normal CTA of the intracranial arteries. No aneurysm or vascular malformation.   Mr Laqueta Jean And Wo Contrast Mr Mrv Head Wo Cm 11/14/2018 IMPRESSION:  1. Unchanged appearance of intraparenchymal hematoma within the left parietal lobe with mild associated mass effect. No abnormal contrast enhancement.  2. No dural venous sinus thrombosis.  3. The etiology of the hemorrhage remains unclear. Follow up MRI with and without contrast with susceptibility-weighted imaging might be helpful in 4-6 weeks.   EKG - SR rate 80 BPM. (See cardiology reading for complete details)   PHYSICAL EXAM Blood pressure 128/89, pulse 92, temperature 98.5 F (36.9 C), temperature source Oral, resp. rate 17, height 5\' 6"  (1.676 m), weight 72.7 kg, last menstrual period 08/28/2016, SpO2 99 %. Pleasant middle-age Caucasian lady not in distress.  . Afebrile. Head is nontraumatic. Neck is supple without bruit.    Cardiac exam no murmur or gallop. Lungs are clear to auscultation. Distal pulses are well felt. Neurological Exam:    She is pleasant awake alert cooperative.  Speech and language appear normal.  Extraocular movements are full range without nystagmus.  She has  diminished blink to threat on the right compared to the left..  Face is symmetric without weakness.  Tongue is midline.  Motor system exam shows no upper or lower extremity drift but there is mild weakness of right grip and intrinsic hand muscles and hip flexors on the right.  Fine finger movements are diminished on the right she orbits left over right upper extremity.  There is slightly impaired finger-to-nose and knee to coordination on the right and normal on the left.  Gait not tested..        Ms. Wendy Collier is a 53 y.o. female with history of migraines, fibromyalgia, bipolar, hypothyroidism, depression, and psychiatric disorder presenting  with headache, confusion followed by reduced vision on the right side and right leg weakness. She did not receive IV t-PA due to hemorrhage.  Intraparenchymal hematoma within the left parietal lobe - etiology likely due to untreated hypertension with excessive caffeine and sympathomimetics (had Monster drink, Dr. Alcus DadPeppers, on Adderall).   Resultant  Mild right-sided weakness and sensory deficit, hemianopia, ataxia right arm and leg  CT head - not performed  MR / MRV head - Unchanged appearance of intraparenchymal hematoma within the left parietal lobe with mild associated mass effect. No abnormal contrast enhancement. No dural venous sinus thrombosis. The etiology of the hemorrhage remains unclear. Follow up MRI with and without contrast with susceptibility-weighted imaging might be helpful in 4-6 weeks.  MRA head - not performed  CTA Head - Lobar intraparenchymal hemorrhage within the left parietal lobe with volume of 33 mL. Moderate edema with mild mass effect on the left occipital lobe. Small amount of subdural extension of hematoma along the posterior falx cerebri.   Carotid Doppler - not indicated  2D Echo - not indicated  [employer] Corona Virus 2 - negative  LDL - not indicated  HgbA1c - not indicated  UDS - + for amphetamines and benzodiazepines (pt has Rxs for both)  VTE prophylaxis - SCDs  Diet - regular  No antithrombotic prior to admission, now on No antithrombotic due to bleed  Ongoing aggressive stroke risk factor management  Therapy recommendations:  CIR, physical medicine consult pending  Disposition:  Pending. Likely Home PT vs CIR  Hypertension  Stable (Cleviprex)  SBP goal < 160 mm Hg . Long-term BP goal normotensive . start metoprolol and lisinopril  Other Stroke Risk Factors  Migraines  Other Active Problems  Psychiatric history   Hospital day # 1 I have personally obtained history,examined this patient, reviewed notes, independently viewed  imaging studies, participated in medical decision making and plan of care.ROS completed by me personally and pertinent positives fully documented  I have made any additions or clarifications directly to the above note.   Patient had apparently transient depressed mental status of unclear etiology which appears to have resolved.  CT scan of the head done yesterday shows no significant interval change in the left parietal hemorrhage with stable 5 mm left-to-right shift.  I did not recommend any further evaluation at the present time and continue ongoing therapies.  Stroke team will sign off.  Kindly call for questions if any. Delia HeadyPramod Sethi, MD Medical Director [facility] Pager: [phone] 11/18/2018 5:31 PM      A total of 15 minutes was spent for the care of this patient, spent on counseling patient and family on different diagnostic and therapeutic options, counseling and coordination of care, riskd ans benefits of management, compliance, or risk factor reduction and education.   To contact  Stroke Continuity provider, please refer to [email or web address] After hours, contact General Neurology

## 2018-11-18 NOTE — Progress Notes (Signed)
Inpatient Rehabilitation  Patient information reviewed and entered into eRehab system by Tajah Noguchi M. Tekelia Kareem, M.A., CCC/SLP, PPS Coordinator.  Information including medical coding, functional ability and quality indicators will be reviewed and updated through discharge.    During the Covid 19 public health emergency, the inpatient rehabilitation unit of the [facility] will use acute care beds on another unit to provide each patient with a private room. This effort is to assure proper infection control and to optimize care management during this public health emergency.     

## 2018-11-18 NOTE — Discharge Instructions (Signed)
Inpatient Rehab Discharge Instructions  Wendy Collier  Activities/Precautions/ Functional Status: Activity: no lifting, driving, or strenuous exercise till cleared by Neurology Diet: cardiac diet Wound Care: none needed   Functional status:  ___ No restrictions     ___ Walk up steps independently _X__ 24/7 supervision/assistance   ___ Walk up steps with assistance ___ Intermittent supervision/assistance  ___ Bathe/dress independently ___ Walk with walker     _X__ Bathe/dress with assistance ___ Walk Independently    ___ Shower independently _X__ Walk with assistance    ___ Shower with assistance ___ No alcohol     ___ Return to work/school ________  Special Instructions: 1. Need to follow up with primary care in next 1-2 days for  Monitoring of blood pressures and further adjustment of BP medications.  2. No energy drinks. No adderall. NO aspirin, ibuprofen, aleve, motrin or any NSAIDS.     My questions have been answered and I understand these instructions. I will adhere to these goals and the provided educational materials after my discharge from the hospital.  Patient/Caregiver Signature _______________________________ Date __________  Clinician Signature _______________________________________ Date __________  Please bring this form and your medication list with you to all your follow-up doctor's appointments.

## 2018-11-19 ENCOUNTER — Inpatient Hospital Stay (HOSPITAL_COMMUNITY): Payer: Medicare Other | Admitting: Occupational Therapy

## 2018-11-19 ENCOUNTER — Inpatient Hospital Stay (HOSPITAL_COMMUNITY): Payer: Medicare Other

## 2018-11-19 ENCOUNTER — Inpatient Hospital Stay (HOSPITAL_COMMUNITY): Payer: Medicare Other | Admitting: Speech Pathology

## 2018-11-19 NOTE — Discharge Summary (Addendum)
Physician Discharge Summary  Patient ID: Wendy Collier MRN: [id number] DOB/AGE: 12-22-1965 53 y.o.  Admit date: 11/17/2018 Discharge date: 11/18/2018  Discharge Diagnoses:  Principal Problem:   Intraparenchymal hematoma of brain [facility]) Active Problems:   Attention deficit hyperactivity disorder (ADHD)   Bipolar 1 disorder (HCC)   Essential hypertension   Discharged Condition: stable   Significant Diagnostic Studies: Ct Angio Head W Or Wo Contrast  Result Date: 11/14/2018 CLINICAL DATA:  Severe headache EXAM: CT ANGIOGRAPHY HEAD TECHNIQUE: Multidetector CT imaging of the head was performed using the standard protocol during bolus administration of intravenous contrast. Multiplanar CT image reconstructions and MIPs were obtained to evaluate the vascular anatomy. CONTRAST:  75mL OMNIPAQUE IOHEXOL 350 MG/ML SOLN COMPARISON:  None. FINDINGS: CT HEAD FINDINGS Brain: There is intraparenchymal hemorrhage within the left parietal lobe that measures 4.8 x 3.3 x 4.0 cm (volume = 33 cm^3). There is moderate surrounding edema with mass effect on the left occipital lobe. There is a small amount of subdural extension along the posterior falx cerebri. No midline shift. Basal cisterns are patent. No hydrocephalus. Skull: The visualized skull base, calvarium and extracranial soft tissues are normal. Sinuses/Orbits: No fluid levels or advanced mucosal thickening of the visualized paranasal sinuses. No mastoid or middle ear effusion. The orbits are normal. CTA HEAD FINDINGS POSTERIOR CIRCULATION: --Basilar artery: Normal. --Posterior cerebral arteries: Normal. Both originate from the basilar artery. --Superior cerebellar arteries: Normal. --Inferior cerebellar arteries: Normal anterior and posterior inferior cerebellar arteries. ANTERIOR CIRCULATION: --Intracranial internal carotid arteries: Normal. --Anterior cerebral arteries: Normal. Both A1 segments are present. Patent anterior communicating artery. --Middle  cerebral arteries: Normal. --Posterior communicating arteries: Present bilaterally. VENOUS SINUSES: As permitted by contrast timing, patent. ANATOMIC VARIANTS: None DELAYED PHASE: No parenchymal contrast enhancement. Review of the MIP images confirms the above findings. IMPRESSION: 1. Lobar intraparenchymal hemorrhage within the left parietal lobe with volume of 33 mL. Moderate edema with mild mass effect on the left occipital lobe. 2. Small amount of subdural extension of hematoma along the posterior falx cerebri. 3. Normal CTA of the intracranial arteries. No aneurysm or vascular malformation. Critical Value/emergent results were called by telephone at the time of interpretation on 11/14/2018 at 3:41 pm to Dr. Meridee Score , who verbally acknowledged these results. Electronically Signed   By: Deatra Robinson M.D.   On: 11/14/2018 15:41   Ct Head Wo Contrast  Result Date: 11/17/2018 CLINICAL DATA:  Follow-up examination for CVA. EXAM: CT HEAD WITHOUT CONTRAST TECHNIQUE: Contiguous axial images were obtained from the base of the skull through the vertex without intravenous contrast. COMPARISON:  Prior CT and MRI from 11/14/2018. FINDINGS: Brain: Lobar intraparenchymal hemorrhage centered at the parasagittal left parietal lobe not significantly changed in size measuring 5.3 x 5.1 x 3.5 cm on today's exam. Associated surrounding vasogenic edema with mild regional mass effect, mildly increased from previous. Associated 5 mm left-to-right shift, progressed from previous. Left lateral ventricle partially effaced. No hydrocephalus or ventricular trapping. No intraventricular extension of hemorrhage. Associated subdural extension with trace subdural blood along the posterior falx, measuring up to 3 mm in maximal thickness, similar to previous. No new intracranial hemorrhage. No other acute large vessel territory infarct. Vascular: No hyperdense vessel. Skull: Scalp soft tissues and calvarium within normal limits.  Sinuses/Orbits: Globes and orbital soft tissues within normal limits. Paranasal sinuses are clear. Trace left mastoid effusion noted. Other: None. IMPRESSION: 1. No significant interval change in size of left parietal lobe intraparenchymal hemorrhage, but with worsened associated vasogenic edema  and regional mass effect. Associated 5 mm of left-to-right shift, progressed from previous. 2. Associated subdural extension with trace subdural blood along the posterior falx, similar to previous. 3. No other new acute intracranial abnormality. Electronically Signed   By: Rise MuBenjamin  McClintock M.D.   On: 11/17/2018 21:38   Mr Wendy JeanBrain W And Wo Contrast  Result Date: 11/14/2018 CLINICAL DATA:  Confusion and headaches.  Intracranial hemorrhage. EXAM: MRI HEAD WITHOUT AND WITH CONTRAST MRV HEAD WITHOUT CONTRAST TECHNIQUE: Multiplanar, multiecho pulse sequences of the brain and surrounding structures were obtained without and with intravenous contrast. Angiographic images of the intracranial venous structures were obtained using MRV technique without intravenous contrast. CONTRAST:  7.5 mL Gadavist COMPARISON:  Head CT 11/14/2018 FINDINGS: MRI BRAIN FINDINGS BRAIN: There is diffusion abnormality at the site of known intraparenchymal hemorrhage. No other diffusion abnormality. Mild edema surrounding the hemorrhage site. White matter signal otherwise normal. The cerebral and cerebellar volume are age-appropriate. No hydrocephalus. Susceptibility-sensitive sequences show no chronic microhemorrhage or superficial siderosis. No abnormal contrast enhancement. VASCULAR: The major intracranial arterial and venous sinus flow voids are normal. SKULL AND UPPER CERVICAL SPINE: Calvarial bone marrow signal is normal. There is no skull base mass. Visualized upper cervical spine and soft tissues are normal. SINUSES/ORBITS: No fluid levels or advanced mucosal thickening. No mastoid or middle ear effusion. The orbits are normal. MRV BRAIN  FINDINGS Superior sagittal sinus: Normal. Straight sinus: Normal. Inferior sagittal sinus, vein of Galen and internal cerebral veins: Normal. Transverse sinuses: Normal. Sigmoid sinuses: Normal. Visualized jugular veins: Normal. IMPRESSION: 1. Unchanged appearance of intraparenchymal hematoma within the left parietal lobe with mild associated mass effect. No abnormal contrast enhancement. 2. No dural venous sinus thrombosis. 3. The etiology of the hemorrhage remains unclear. Follow up MRI with and without contrast with susceptibility-weighted imaging might be helpful in 4-6 weeks. Electronically Signed   By: Deatra RobinsonKevin  Herman M.D.   On: 11/14/2018 17:10   Mr Mrv Head Wo Cm  Result Date: 11/14/2018 CLINICAL DATA:  Confusion and headaches.  Intracranial hemorrhage. EXAM: MRI HEAD WITHOUT AND WITH CONTRAST MRV HEAD WITHOUT CONTRAST TECHNIQUE: Multiplanar, multiecho pulse sequences of the brain and surrounding structures were obtained without and with intravenous contrast. Angiographic images of the intracranial venous structures were obtained using MRV technique without intravenous contrast. CONTRAST:  7.5 mL Gadavist COMPARISON:  Head CT 11/14/2018 FINDINGS: MRI BRAIN FINDINGS BRAIN: There is diffusion abnormality at the site of known intraparenchymal hemorrhage. No other diffusion abnormality. Mild edema surrounding the hemorrhage site. White matter signal otherwise normal. The cerebral and cerebellar volume are age-appropriate. No hydrocephalus. Susceptibility-sensitive sequences show no chronic microhemorrhage or superficial siderosis. No abnormal contrast enhancement. VASCULAR: The major intracranial arterial and venous sinus flow voids are normal. SKULL AND UPPER CERVICAL SPINE: Calvarial bone marrow signal is normal. There is no skull base mass. Visualized upper cervical spine and soft tissues are normal. SINUSES/ORBITS: No fluid levels or advanced mucosal thickening. No mastoid or middle ear effusion. The  orbits are normal. MRV BRAIN FINDINGS Superior sagittal sinus: Normal. Straight sinus: Normal. Inferior sagittal sinus, vein of Galen and internal cerebral veins: Normal. Transverse sinuses: Normal. Sigmoid sinuses: Normal. Visualized jugular veins: Normal. IMPRESSION: 1. Unchanged appearance of intraparenchymal hematoma within the left parietal lobe with mild associated mass effect. No abnormal contrast enhancement. 2. No dural venous sinus thrombosis. 3. The etiology of the hemorrhage remains unclear. Follow up MRI with and without contrast with susceptibility-weighted imaging might be helpful in 4-6 weeks. Electronically Signed  By: Deatra Robinson M.D.   On: 11/14/2018 17:10    Labs:  Basic Metabolic Panel: Recent Labs  Lab 11/14/18 1450 11/18/18 0403  NA 141 138  K 4.0 4.0  CL 105 103  CO2 24 23  GLUCOSE 101* 109*  BUN 20 18  CREATININE 0.80 0.85  CALCIUM 9.7 9.7    CBC: Recent Labs  Lab 11/14/18 1450 11/18/18 0403  WBC 10.5 10.0  NEUTROABS 7.2 6.8  HGB 13.9 14.0  HCT 41.1 41.9  MCV 88.0 86.7  PLT 260 274    CBG: No results for input(s): GLUCAP in the last 168 hours.  Brief HPI:   Kaislynn Pigue is a 53 year old female with history of chronic pain, chronic fatigue, FM, bipolar disorder who was admitted on 11/14/18 with one day history of HA and confusion progressing to right visual field deficits and right sided weakness. CTA head revealed lobar IPH in left parietal lobe with moderate edema and mass effect on left occipital lobe with small amount of SDH extension--no AVM or aneurysm noted. MRV brain was negative for dural sinus thrombosis and showed no change in bleed. Neurology recommended keeping SBP< 150 and follow up MRI brain with contrast in 4-6 weeks Bleed felt to be due to elevated BP, excessive caffeine and sympathomimetics. Headaches and nausea improving but patient continued to be limited by right sided weakness with sensory deficits, right hemianopsia and dizziness  affecting mobility and ADLs. CIR recommended due to functional decline.    Hospital Course: Muntaha Marrano was admitted to rehab 11/17/2018 for inpatient therapies to consist of PT, ST and OT at least three hours five days a week. Past admission physiatrist, therapy team and rehab RN have worked together to provide customized collaborative inpatient rehab. Zofran was scheduled ac/hs to help with GI issues. BP monitored TID and was showing some improvement in control. Follow up labs showed renal status to be stable.  Therapy evaluations done revealing that patient required mod to max assist with mobility and mod assist with basic self care tasks. Cognitive evaluation revealed mild to moderate impairments in [facility] recall with delayed processing and difficulty following multi-step commands. Patient was extremely frustrated by safety precautions as well as discussion regarding LOS to reach goals. She demanded on going home. Discussion was had with patient and husband regarding need to monitor BP, safety concerns due to physical and cognitive deficits and need for physical assistance.  Husband reports that patient was not on any medications for BP at home therefore medications X one month sent to their pharmacy with instructions to start medications in am. They were both agreeable to leave AMA and plan to follow up with PCP in next 1-2 days.         Disposition: Home against medical advise.   Diet: heart Healthy.   Special Instructions: 1. Will need to follow up with primary MD in 24-48 hours for BP recheck and medical follow up. 2. Will need MRI brain with contrast in 4 weeks for follow up on bleed.  3. Discontinue energy drinks and Adderall. Avoid ASA, alcohol and all NSAIDs.    Allergies as of 11/18/2018      Reactions   Tegaderm Ag Mesh [silver]       Medication List    STOP taking these medications   amphetamine-dextroamphetamine 10 MG tablet Commonly known as:  ADDERALL   Biotin 1 MG Caps    celecoxib 200 MG capsule Commonly known as:  CELEBREX   cholecalciferol  1000 units tablet Commonly known as:  VITAMIN D   cloNIDine 0.1 MG tablet Commonly known as:  CATAPRES   Fish Oil 1000 MG Caps   HYDROcodone-acetaminophen 5-325 MG tablet Commonly known as:  NORCO/VICODIN   mirtazapine 45 MG tablet Commonly known as:  REMERON   Pitavastatin Calcium 2 MG Tabs   selenium 50 MCG Tabs tablet   zolpidem 10 MG tablet Commonly known as:  AMBIEN     TAKE these medications   ALPRAZolam 0.5 MG tablet Commonly known as:  XANAX Take 0.5 mg by mouth 3 (three) times daily as needed for anxiety.   benazepril 10 MG tablet Commonly known as:  LOTENSIN Take 1 tablet (10 mg total) by mouth 2 (two) times daily.   DULoxetine 30 MG capsule Commonly known as:  CYMBALTA Take 30 mg by mouth daily. With two 20 mg capsules   DULoxetine 20 MG capsule Commonly known as:  CYMBALTA Take 40 mg by mouth daily. With 30 mg capsule daily   levothyroxine 50 MCG tablet Commonly known as:  SYNTHROID Take 50 mcg by mouth daily before breakfast.   metoprolol succinate 25 MG 24 hr tablet Commonly known as:  TOPROL-XL Take 1 tablet (25 mg total) by mouth daily.   multivitamin with minerals tablet Take 1 tablet by mouth daily.   ondansetron 4 MG tablet Commonly known as:  ZOFRAN Take 1 tablet (4 mg total) by mouth every 8 (eight) hours as needed for nausea or vomiting.   pantoprazole 40 MG tablet Commonly known as:  PROTONIX Take 1 tablet (40 mg total) by mouth at bedtime.   Rexulti 2 MG Tabs Generic drug:  Brexpiprazole Take 1 tablet by mouth daily.   senna-docusate 8.6-50 MG tablet Commonly known as:  Senokot-S Take 2 tablets by mouth at bedtime.   tiZANidine 4 MG tablet Commonly known as:  ZANAFLEX Take 4 mg by mouth at bedtime.   vitamin C 100 MG tablet Take 100 mg by mouth daily.      Follow-up Information    Dettinger, Elige Radon, MD. Call on 11/19/2018.   Specialties:   Family Medicine, Cardiology Why:  for post hospital check in 1-2 days.  Contact information: [address] [place] [address] [phone]        Anson Fret, MD. Call on 11/19/2018.   Specialty:  Neurology Why:  For follow up appointment in 3-4 weeks.  Contact information: [address] [place] [address] [phone]        Marcello Fennel, MD Follow up.   Specialty:  Physical Medicine and Rehabilitation Why:  as neded Contact information: [address] [place] [place] [address] [place] [address] [phone]           Signed: Jacquelynn Cree 11/19/2018, 8:52 AM Patient seen and examined by me on day of discharge. Maryla Morrow, MD, ABPMR

## 2018-11-20 ENCOUNTER — Other Ambulatory Visit: Payer: Self-pay

## 2018-11-21 ENCOUNTER — Ambulatory Visit (INDEPENDENT_AMBULATORY_CARE_PROVIDER_SITE_OTHER): Payer: Medicare Other | Admitting: Family Medicine

## 2018-11-21 ENCOUNTER — Encounter: Payer: Self-pay | Admitting: Family Medicine

## 2018-11-21 VITALS — BP 128/85 | HR 101 | Temp 97.3°F

## 2018-11-21 DIAGNOSIS — I619 Nontraumatic intracerebral hemorrhage, unspecified: Secondary | ICD-10-CM

## 2018-11-21 DIAGNOSIS — I611 Nontraumatic intracerebral hemorrhage in hemisphere, cortical: Secondary | ICD-10-CM

## 2018-11-21 DIAGNOSIS — S06360S Traumatic hemorrhage of cerebrum, unspecified, without loss of consciousness, sequela: Secondary | ICD-10-CM

## 2018-11-21 DIAGNOSIS — H53451 Other localized visual field defect, right eye: Secondary | ICD-10-CM

## 2018-11-21 DIAGNOSIS — S06330S Contusion and laceration of cerebrum, unspecified, without loss of consciousness, sequela: Secondary | ICD-10-CM

## 2018-11-21 DIAGNOSIS — R531 Weakness: Secondary | ICD-10-CM | POA: Diagnosis not present

## 2018-11-21 MED ORDER — METOPROLOL SUCCINATE ER 25 MG PO TB24: 25.0000 mg | ORAL_TABLET | Freq: Every day | ORAL | 1 refills | Status: DC

## 2018-11-21 MED ORDER — ROSUVASTATIN CALCIUM 10 MG PO TABS: 10.0000 mg | ORAL_TABLET | Freq: Every day | ORAL | 3 refills | Status: DC

## 2018-11-21 MED ORDER — DULOXETINE HCL 20 MG PO CPEP: 40.0000 mg | ORAL_CAPSULE | Freq: Every day | ORAL | 1 refills | Status: DC

## 2018-11-21 MED ORDER — BENAZEPRIL HCL 10 MG PO TABS: 10.0000 mg | ORAL_TABLET | Freq: Two times a day (BID) | ORAL | 1 refills | Status: DC

## 2018-11-21 MED ORDER — DULOXETINE HCL 30 MG PO CPEP: 30.0000 mg | ORAL_CAPSULE | Freq: Every day | ORAL | 1 refills | Status: DC

## 2018-11-21 NOTE — Progress Notes (Signed)
BP 128/85   Pulse (!) 101   Temp (!) 97.3 F (36.3 C) (Oral)   LMP 08/28/2016 (Approximate) Comment: Pt reports being unknown when she had her last period, but had spotting two days ago.    Subjective:   Patient ID: Wendy Collier, female    DOB: October 05, 1965, 53 y.o.   MRN: [id number]  HPI: Wendy Collier is a 53 y.o. female presenting on 11/21/2018 for Hospitalization Follow-up (5/22 [facility] [place] )   HPI Hospital follow-up Patient was admitted to the emergency department from 11/13/2020 11/17/2018.  Patient was seen in the hospital for a hematoma in the brain and intracerebral bleed and stroke, this has left her with weakness on the right side of her body in both her arm and her leg and right visual defect.  Patient was contacted on 11/18/2018 by [facility] for transitional care.  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  Patient needs some help with walker and home health care and physical therapy set up at home.  This has not been set up after leaving the hospital.  She does have follow-up with neurology.  Patient says that her speech and vision and right-sided weakness are improving.  She still has a lot of difficulty walking because of the right-sided weakness in her leg and some balance.  She is here in a wheelchair today.  Relevant past medical, surgical, family and social history reviewed and updated as indicated. Interim medical history since our last visit reviewed. Allergies and medications reviewed and updated.  Review of Systems  Constitutional: Positive for fatigue. Negative for chills and fever.  Eyes: Positive for visual disturbance.  Respiratory: Negative for chest tightness and shortness of breath.   Cardiovascular: Negative for chest pain and leg swelling.  Musculoskeletal: Positive for gait problem. Negative for back pain.  Skin: Negative for rash.  Neurological: Positive for weakness. Negative for light-headedness and headaches.  Psychiatric/Behavioral: Negative for  agitation, behavioral problems, decreased concentration, dysphoric mood and sleep disturbance.  All other systems reviewed and are negative.   Per HPI unless specifically indicated above   Allergies as of 11/21/2018      Reactions   Tegaderm Ag Mesh [silver]       Medication List       Accurate as of Nov 21, 2018 11:59 PM. If you have any questions, ask your nurse or doctor.        ALPRAZolam 0.5 MG tablet Commonly known as:  XANAX Take 0.5 mg by mouth 3 (three) times daily as needed for anxiety.   benazepril 10 MG tablet Commonly known as:  LOTENSIN Take 1 tablet (10 mg total) by mouth 2 (two) times daily.   DULoxetine 20 MG capsule Commonly known as:  CYMBALTA Take 2 capsules (40 mg total) by mouth daily. With 30 mg capsule daily   DULoxetine 30 MG capsule Commonly known as:  CYMBALTA Take 1 capsule (30 mg total) by mouth daily. With two 20 mg capsules   levothyroxine 50 MCG tablet Commonly known as:  SYNTHROID Take 50 mcg by mouth daily before breakfast.   metoprolol succinate 25 MG 24 hr tablet Commonly known as:  TOPROL-XL Take 1 tablet (25 mg total) by mouth daily.   multivitamin with minerals tablet Take 1 tablet by mouth daily.   ondansetron 4 MG tablet Commonly known as:  ZOFRAN Take 1 tablet (4 mg total) by mouth every 8 (eight) hours as needed for nausea or vomiting.   pantoprazole 40 MG  tablet Commonly known as:  PROTONIX Take 1 tablet (40 mg total) by mouth at bedtime.   Rexulti 2 MG Tabs Generic drug:  Brexpiprazole Take 1 tablet by mouth daily.   rosuvastatin 10 MG tablet Commonly known as:  Crestor Take 1 tablet (10 mg total) by mouth daily. Started by:  Joshua A Dettinger, MD   senna-docusate 8.6-50 MG tablet Commonly known as:  Senokot-S Take 2 tablets by mouth at bedtime.   tiZANidine 4 MG tablet Commonly known as:  ZANAFLEX Take 4 mg by mouth at bedtime.   vitamin C 100 MG tablet Take 100 mg by mouth daily.             Durable Medical Equipment  (From admission, onward)         Start     Ordered   11/21/18 0000  DME Bedside commode    Question:  Patient needs a bedside commode to treat with the following condition  Answer:  Hemorrhagic stroke (HCC)   11/21/18 1209   11/21/18 0000  For home use only DME Walker    Question:  Patient needs a walker to treat with the following condition  Answer:  Hemorrhagic stroke (HCC)   11/21/18 1209   11/21/18 0000  For home use only DME Other see comment    Comments:  Shower chair, dx hemorrhagic stroke  Question:  Length of Need  Answer:  12 Months   11/21/18 1209           Objective:   BP 128/85   Pulse (!) 101   Temp (!) 97.3 F (36.3 C) (Oral)   LMP 08/28/2016 (Approximate) Comment: Pt reports being unknown when she had her last period, but had spotting two days ago.   Wt Readings from Last 3 Encounters:  11/17/18 160 lb 4.4 oz (72.7 kg)  11/14/18 164 lb 7.4 oz (74.6 kg)  04/30/18 176 lb (79.8 kg)    Physical Exam Vitals signs and nursing note reviewed.  Constitutional:      General: She is not in acute distress.    Appearance: She is well-developed. She is not diaphoretic.  Eyes:     Conjunctiva/sclera: Conjunctivae normal.  Cardiovascular:     Rate and Rhythm: Normal rate and regular rhythm.     Heart sounds: Normal heart sounds. No murmur.  Pulmonary:     Effort: Pulmonary effort is normal. No respiratory distress.     Breath sounds: Normal breath sounds. No wheezing.  Musculoskeletal: Normal range of motion.        General: No tenderness.  Neurological:     Mental Status: She is alert and oriented to person, place, and time.     Cranial Nerves: Cranial nerves are intact.     Sensory: Sensation is intact.     Motor: Weakness (4 out of 5 strength in right arm and right leg) present. No tremor.     Coordination: Coordination normal.     Gait: Gait abnormal (In a wheelchair today.).  Psychiatric:        Behavior: Behavior normal.      Assessment & Plan:   Problem List Items Addressed This Visit      Nervous and Auditory   ICH (intracerebral hemorrhage) (HCC) - Primary   Relevant Medications   benazepril (LOTENSIN) 10 MG tablet   metoprolol succinate (TOPROL-XL) 25 MG 24 hr tablet   Other Relevant Orders   CBC with Differential/Platelet (Completed)   CMP14+EGFR (Completed)   Lipid panel (Completed)     Ambulatory referral to Home Health   DME Bedside commode   For home use only DME Walker   For home use only DME Other see comment   CBC with Differential/Platelet (Completed)   CMP14+EGFR (Completed)   Lipid panel (Completed)   Intraparenchymal hematoma of brain (HCC)   Relevant Medications   benazepril (LOTENSIN) 10 MG tablet   metoprolol succinate (TOPROL-XL) 25 MG 24 hr tablet   Other Relevant Orders   Ambulatory referral to Home Health   DME Bedside commode   For home use only DME Walker   For home use only DME Other see comment   CBC with Differential/Platelet (Completed)   CMP14+EGFR (Completed)   Lipid panel (Completed)    Other Visit Diagnoses    Right sided weakness       Relevant Medications   benazepril (LOTENSIN) 10 MG tablet   metoprolol succinate (TOPROL-XL) 25 MG 24 hr tablet   Other Relevant Orders   DME Bedside commode   For home use only DME Walker   For home use only DME Other see comment   CBC with Differential/Platelet (Completed)   CMP14+EGFR (Completed)   Lipid panel (Completed)   Peripheral visual field defect, right       Relevant Orders   CBC with Differential/Platelet (Completed)   CMP14+EGFR (Completed)   Lipid panel (Completed)      Will refer to home health and get walker  Continue benazepril and metoprolol which were both started in the hospital. Follow up plan: Return in about 2 weeks (around 12/05/2018), or if symptoms worsen or fail to improve, for recheck htn.  Counseling provided for all of the vaccine components Orders Placed This Encounter   Procedures  . DME Bedside commode  . For home use only DME Walker  . For home use only DME Other see comment  . CBC with Differential/Platelet  . CMP14+EGFR  . Lipid panel  . Ambulatory referral to Home Health    Joshua Dettinger, MD [facility] 11/26/2018, 10:18 PM     

## 2018-11-22 LAB — CMP14+EGFR
ALT: 20 IU/L (ref 0–32)
AST: 22 IU/L (ref 0–40)
Albumin/Globulin Ratio: 2.2 (ref 1.2–2.2)
Albumin: 4.8 g/dL (ref 3.8–4.8)
Alkaline Phosphatase: 115 IU/L (ref 39–117)
BUN/Creatinine Ratio: 18 (ref 9–23)
BUN: 15 mg/dL (ref 6–24)
Bilirubin Total: 0.2 mg/dL (ref 0.0–1.2)
CO2: 25 mmol/L (ref 20–29)
Calcium: 10.3 mg/dL — ABNORMAL HIGH (ref 8.7–10.2)
Chloride: 98 mmol/L (ref 96–106)
Creatinine, Ser: 0.82 mg/dL (ref 0.57–1.00)
GFR calc Af Amer: 96 mL/min/{1.73_m2} (ref >59)
GFR calc non Af Amer: 84 mL/min/{1.73_m2} (ref >59)
Globulin, Total: 2.2 g/dL (ref 1.5–4.5)
Glucose: 98 mg/dL (ref 65–99)
Potassium: 5.2 mmol/L (ref 3.5–5.2)
Sodium: 140 mmol/L (ref 134–144)
Total Protein: 7 g/dL (ref 6.0–8.5)

## 2018-11-22 LAB — CBC WITH DIFFERENTIAL/PLATELET
Basophils Absolute: 0.1 10*3/uL (ref 0.0–0.2)
Basos: 1 %
EOS (ABSOLUTE): 0.4 10*3/uL (ref 0.0–0.4)
Eos: 5 %
Hematocrit: 42.5 % (ref 34.0–46.6)
Hemoglobin: 14.6 g/dL (ref 11.1–15.9)
Immature Grans (Abs): 0 10*3/uL (ref 0.0–0.1)
Immature Granulocytes: 0 %
Lymphocytes Absolute: 2.7 10*3/uL (ref 0.7–3.1)
Lymphs: 29 %
MCH: 29.5 pg (ref 26.6–33.0)
MCHC: 34.4 g/dL (ref 31.5–35.7)
MCV: 86 fL (ref 79–97)
Monocytes Absolute: 0.8 10*3/uL (ref 0.1–0.9)
Monocytes: 9 %
Neutrophils Absolute: 5.3 10*3/uL (ref 1.4–7.0)
Neutrophils: 56 %
Platelets: 305 10*3/uL (ref 150–450)
RBC: 4.95 x10E6/uL (ref 3.77–5.28)
RDW: 12.6 % (ref 11.7–15.4)
WBC: 9.3 10*3/uL (ref 3.4–10.8)

## 2018-11-22 LAB — LIPID PANEL
Chol/HDL Ratio: 3.7 ratio (ref 0.0–4.4)
Cholesterol, Total: 157 mg/dL (ref 100–199)
HDL: 42 mg/dL (ref >39)
LDL Calculated: 72 mg/dL (ref 0–99)
Triglycerides: 216 mg/dL — ABNORMAL HIGH (ref 0–149)
VLDL Cholesterol Cal: 43 mg/dL — ABNORMAL HIGH (ref 5–40)

## 2018-11-28 ENCOUNTER — Telehealth: Payer: Self-pay | Admitting: Family Medicine

## 2018-11-28 NOTE — Telephone Encounter (Signed)
Patient aware of lab results.

## 2018-12-02 ENCOUNTER — Telehealth: Payer: Self-pay | Admitting: *Deleted

## 2018-12-02 ENCOUNTER — Other Ambulatory Visit: Payer: Self-pay

## 2018-12-02 ENCOUNTER — Ambulatory Visit (INDEPENDENT_AMBULATORY_CARE_PROVIDER_SITE_OTHER): Payer: Medicare Other | Admitting: *Deleted

## 2018-12-02 DIAGNOSIS — Z0000 Encounter for general adult medical examination without abnormal findings: Secondary | ICD-10-CM | POA: Diagnosis not present

## 2018-12-02 NOTE — Progress Notes (Addendum)
MEDICARE ANNUAL WELLNESS VISIT  12/02/2018  Telephone Visit Disclaimer This Medicare AWV was conducted by telephone due to national recommendations for restrictions regarding the COVID-19 Pandemic (e.g. social distancing).  I verified, using two identifiers, that I am speaking with Wendy Collier or their authorized healthcare agent. I discussed the limitations, risks, security, and privacy concerns of performing an evaluation and management service by telephone and the potential availability of an in-person appointment in the future. The patient expressed understanding and agreed to proceed.   Subjective:  Wendy Howellsngela Collier is a 53 y.o. female patient of Dettinger, Elige RadonJoshua A, MD who had a Medicare Annual Wellness Visit today via telephone. Wendy Collier is Disabled and lives with their spouse. she has 2 children. she reports that she is socially active and does interact with friends/family regularly. she is minimally physically active and enjoys crafting, sewing, quilting, painting and photography.  Patient Care Team: Dettinger, Elige RadonJoshua A, MD as PCP - General (Family Medicine)  Advanced Directives 12/02/2018 11/17/2018 11/15/2018 11/14/2018 04/30/2018 08/30/2016  Does Patient Have a Medical Advance Directive? No No - No No No  Would patient like information on creating a medical advance directive? No - Patient declined No - Patient declined No - Patient declined - - [facility]-    Hospital Utilization Over the Past 12 Months: # of hospitalizations or ER visits: 1 # of surgeries: 3  Review of Systems    Patient reports that her overall health is worse compared to last year.  Patient Reported Readings (BP, Pulse, CBG, Weight, etc) none  Review of Systems: No complaints  All other systems negative.  Pain Assessment Pain : No/denies pain     Current Medications & Allergies (verified) Allergies as of 12/02/2018      Reactions   Silver Rash   Tegaderm Tegaderm Tegaderm Tegaderm Tegaderm Tegaderm       Medication List       Accurate as of December 02, 2018  2:59 PM. If you have any questions, ask your nurse or doctor.        STOP taking these medications   ondansetron 4 MG tablet Commonly known as:  ZOFRAN   vitamin C 100 MG tablet     TAKE these medications   ALPRAZolam 0.5 MG tablet Commonly known as:  XANAX Take 0.5 mg by mouth 3 (three) times daily as needed for anxiety.   benazepril 10 MG tablet Commonly known as:  LOTENSIN Take 1 tablet (10 mg total) by mouth 2 (two) times daily.   DULoxetine 30 MG capsule Commonly known as:  CYMBALTA Take 1 capsule (30 mg total) by mouth daily. With two 20 mg capsules What changed:  Another medication with the same name was removed. Continue taking this medication, and follow the directions you see here.   levothyroxine 50 MCG tablet Commonly known as:  SYNTHROID Take 50 mcg by mouth daily before breakfast.   metoprolol succinate 25 MG 24 hr tablet Commonly known as:  TOPROL-XL Take 1 tablet (25 mg total) by mouth daily.   multivitamin with minerals tablet Take 1 tablet by mouth daily.   pantoprazole 40 MG tablet Commonly known as:  PROTONIX Take 1 tablet (40 mg total) by mouth at bedtime.   Rexulti 2 MG Tabs Generic drug:  Brexpiprazole Take 1 tablet by mouth daily.   rosuvastatin 10 MG tablet Commonly known as:  Crestor Take 1 tablet (10 mg total) by mouth daily.   senna-docusate 8.6-50 MG tablet Commonly known as:  Senokot-S  Take 2 tablets by mouth at bedtime.   tiZANidine 4 MG tablet Commonly known as:  ZANAFLEX Take 4 mg by mouth at bedtime.       History (reviewed): Past Medical History:  Diagnosis Date  . Anxiety disorder    with panic attacks.   . Arthritis   . Bipolar 1 disorder ([place])   . Chronic fatigue   . Chronic pain    neck/back with spasms.   . Depression   . Fibromyalgia   . Frequent headaches   . Hypertension   . Thyroid disease    Hypothyroidism    Past Surgical History:  Procedure  Laterality Date  . ANTERIOR CERVICAL DECOMP/DISCECTOMY FUSION    . BACK SURGERY    . KNEE SURGERY     Family History  Problem Relation Age of Onset  . Alcohol abuse Mother   . COPD Mother   . Depression Mother   . Drug abuse Mother   . Early death Mother   . Mental illness Mother   . Alcohol abuse Father   . Cancer Father        kidney  . Mental illness Father   . Mental illness Brother        schizophrenia   Social History   Socioeconomic History  . Marital status: Married    Spouse name: Wendy Collier  . Number of children: 2  . Years of education: 71  . Highest education level: Some college, no degree  Occupational History  . Occupation: disability  Social Needs  . Financial resource strain: Somewhat hard  . Food insecurity:    Worry: Never true    Inability: Never true  . Transportation needs:    Medical: No    Non-medical: No  Tobacco Use  . Smoking status: Never Smoker  . Smokeless tobacco: Never Used  Substance and Sexual Activity  . Alcohol use: No  . Drug use: No  . Sexual activity: Yes    Birth control/protection: Post-menopausal    Comment: Married since 2010  Lifestyle  . Physical activity:    Days per week: 7 days    Minutes per session: 30 min  . Stress: Only a little  Relationships  . Social connections:    Talks on phone: More than three times a week    Gets together: More than three times a week    Attends religious service: More than 4 times per year    Active member of club or organization: No    Attends meetings of clubs or organizations: Never    Relationship status: Married  Other Topics Concern  . Not on file  Social History Narrative  . Not on file    Activities of Daily Living In your present state of health, do you have any difficulty performing the following activities: 12/02/2018 11/17/2018  Hearing? N N  Vision? N Y  Difficulty concentrating or making decisions? N N  Walking or climbing stairs? Y Y  Comment pt not climbing  stairs after stroke, doesn't want to fall -  Dressing or bathing? N Y  Doing errands, shopping? Tempie Donning  Comment husband is doing this since her recent stroke  -  Conservation officer, nature and eating ? N -  Using the Toilet? N -  In the past six months, have you accidently leaked urine? Y -  Comment wears pad to bed -  Do you have problems with loss of bowel control? N -  Managing your Medications? N -  Managing your Finances? Y -  Comment husband takes care of finances -  Housekeeping or managing your Housekeeping? Y -  Comment husband doing the housework -  Some recent data might be hidden    Patient Literacy How often do you need to have someone help you when you read instructions, pamphlets, or other written materials from your doctor or pharmacy?: 1 - Never What is the last grade level you completed in school?: 12th grade and some college  Exercise Current Exercise Habits: Home exercise routine, Type of exercise: Other - see comments(stationary bike), Time (Minutes): 30, Frequency (Times/Week): 7, Weekly Exercise (Minutes/Week): 210, Intensity: Mild, Exercise limited by: orthopedic condition(s)  Diet Patient reports consuming 2 meals a day and 2 snack(s) a day Patient reports that her primary diet is: Regular Patient reports that she does have regular access to food.   Depression Screen PHQ 2/9 Scores 12/02/2018 11/21/2018 10/10/2016 09/10/2016  PHQ - 2 Score 0 0 4 4  PHQ- 9 Score - - 19 18     Fall Risk Fall Risk  12/02/2018 11/21/2018  Falls in the past year? 1 1  Number falls in past yr: 0 0  Injury with Fall? 0 -     Objective:  Wendy Collier seemed alert and oriented and she participated appropriately during our telephone visit.  Blood Pressure Weight BMI  BP Readings from Last 3 Encounters:  11/21/18 128/85  11/18/18 128/89  11/17/18 (!) 143/92   Wt Readings from Last 3 Encounters:  11/17/18 160 lb 4.4 oz (72.7 kg)  11/14/18 164 lb 7.4 oz (74.6 kg)  04/30/18 176 lb (79.8 kg)    BMI Readings from Last 1 Encounters:  11/17/18 25.87 kg/m    *Unable to obtain current vital signs, weight, and BMI due to telephone visit type  Hearing/Vision  . Wendy Collier to have difficulty with hearing/understanding during the telephone conversation . Reports that she has not had a formal eye exam by an eye care professional within the past year . Reports that she has not had a formal hearing evaluation within the past year *Unable to fully assess hearing and vision during telephone visit type  Cognitive Function: 6CIT Screen 12/02/2018  What Year? 0 points  What month? 0 points  What time? 0 points  Count back from 20 0 points  Months in reverse 0 points  Repeat phrase 2 points  Total Score 2    Normal Cognitive Function Screening: Yes (Normal:0-7, Significant for Dysfunction: >8)  Immunization & Health Maintenance Record  There is no immunization history on file for this patient.  Health Maintenance  Topic Date Due  . TETANUS/TDAP  12/04/1986  . PAP SMEAR-Modifier  10/02/2014  . MAMMOGRAM  12/03/2017  . INFLUENZA VACCINE  01/24/2019  . COLONOSCOPY  10/24/2024  . HIV Screening  Completed       Assessment  This is a routine wellness examination for Wendy Collier.  Health Maintenance: Due or Overdue Health Maintenance Due  Topic Date Due  . TETANUS/TDAP  12/04/1986  . PAP SMEAR-Modifier  10/02/2014  . MAMMOGRAM  12/03/2017    Wendy Collier does not need a referral for Community Assistance: Care Management:   no Social Work:    no Prescription Assistance:  no Nutrition/Diabetes Education:  no   Plan:  Personalized Goals Goals Addressed            This Visit's Progress   . Weight (lb) < 150 lb (68 kg) (pt-stated)       "  I would like to get down to around 145 lbs"      Personalized Health Maintenance & Screening Recommendations  Screening mammography Shingle vaccine  Lung Cancer Screening Recommended: no (Low Dose CT Chest  recommended if Age 62-80 years, 30 pack-year currently smoking OR have quit w/in past 15 years) Hepatitis C Screening recommended: no HIV Screening recommended: no  Advanced Directives: Written information was not prepared per patient's request.  Referrals & Orders No orders of the defined types were placed in this encounter.   Follow-up Plan . Follow-up with Dettinger, Elige RadonJoshua A, MD as planned . Schedule Screening Mammogram . Consider Shingles vaccine at your next visit with your PCP   I have personally reviewed and noted the following in the patient's chart:   . Medical and social history . Use of alcohol, tobacco or illicit drugs  . Current medications and supplements . Functional ability and status . Nutritional status . Physical activity . Advanced directives . List of other physicians . Hospitalizations, surgeries, and ER visits in previous 12 months . Vitals . Screenings to include cognitive, depression, and falls . Referrals and appointments  In addition, I have reviewed and discussed with Wendy HowellsAngela Klabunde certain preventive protocols, quality metrics, and best practice recommendations. A written personalized care plan for preventive services as well as general preventive health recommendations is available and can be mailed to the patient at her request.      Margurite AuerbachCompton, [place] Souter G  12/02/2018     I have reviewed and agree with the above AWV documentation.   Jannifer Rodneyhristy Hawks, FNP

## 2018-12-02 NOTE — Patient Instructions (Signed)
Preventive Care 40-64 Years, Female Preventive care refers to lifestyle choices and visits with your health care provider that can promote health and wellness. What does preventive care include?   A yearly physical exam. This is also called an annual well check.  Dental exams once or twice a year.  Routine eye exams. Ask your health care provider how often you should have your eyes checked.  Personal lifestyle choices, including: ? Daily care of your teeth and gums. ? Regular physical activity. ? Eating a healthy diet. ? Avoiding tobacco and drug use. ? Limiting alcohol use. ? Practicing safe sex. ? Taking low-dose aspirin daily starting at age 50. ? Taking vitamin and mineral supplements as recommended by your health care provider. What happens during an annual well check? The services and screenings done by your health care provider during your annual well check will depend on your age, overall health, lifestyle risk factors, and family history of disease. Counseling Your health care provider may ask you questions about your:  Alcohol use.  Tobacco use.  Drug use.  Emotional well-being.  Home and relationship well-being.  Sexual activity.  Eating habits.  Work and work environment.  Method of birth control.  Menstrual cycle.  Pregnancy history. Screening You may have the following tests or measurements:  Height, weight, and BMI.  Blood pressure.  Lipid and cholesterol levels. These may be checked every 5 years, or more frequently if you are over 50 years old.  Skin check.  Lung cancer screening. You may have this screening every year starting at age 55 if you have a 30-pack-year history of smoking and currently smoke or have quit within the past 15 years.  Colorectal cancer screening. All adults should have this screening starting at age 50 and continuing until age 75. Your health care provider may recommend screening at age 45. You will have tests every  1-10 years, depending on your results and the type of screening test. People at increased risk should start screening at an earlier age. Screening tests may include: ? Guaiac-based fecal occult blood testing. ? Fecal immunochemical test (FIT). ? Stool DNA test. ? Virtual colonoscopy. ? Sigmoidoscopy. During this test, a flexible tube with a tiny camera (sigmoidoscope) is used to examine your rectum and lower colon. The sigmoidoscope is inserted through your anus into your rectum and lower colon. ? Colonoscopy. During this test, a long, thin, flexible tube with a tiny camera (colonoscope) is used to examine your entire colon and rectum.  Hepatitis C blood test.  Hepatitis B blood test.  Sexually transmitted disease (STD) testing.  Diabetes screening. This is done by checking your blood sugar (glucose) after you have not eaten for a while (fasting). You may have this done every 1-3 years.  Mammogram. This may be done every 1-2 years. Talk to your health care provider about when you should start having regular mammograms. This may depend on whether you have a family history of breast cancer.  BRCA-related cancer screening. This may be done if you have a family history of breast, ovarian, tubal, or peritoneal cancers.  Pelvic exam and Pap test. This may be done every 3 years starting at age 21. Starting at age 30, this may be done every 5 years if you have a Pap test in combination with an HPV test.  Bone density scan. This is done to screen for osteoporosis. You may have this scan if you are at high risk for osteoporosis. Discuss your test results, treatment options,   and if necessary, the need for more tests with your health care provider. Vaccines Your health care provider may recommend certain vaccines, such as:  Influenza vaccine. This is recommended every year.  Tetanus, diphtheria, and acellular pertussis (Tdap, Td) vaccine. You may need a Td booster every 10 years.  Varicella  vaccine. You may need this if you have not been vaccinated.  Zoster vaccine. You may need this after age 38.  Measles, mumps, and rubella (MMR) vaccine. You may need at least one dose of MMR if you were born in 1957 or later. You may also need a second dose.  Pneumococcal 13-valent conjugate (PCV13) vaccine. You may need this if you have certain conditions and were not previously vaccinated.  Pneumococcal polysaccharide (PPSV23) vaccine. You may need one or two doses if you smoke cigarettes or if you have certain conditions.  Meningococcal vaccine. You may need this if you have certain conditions.  Hepatitis A vaccine. You may need this if you have certain conditions or if you travel or work in places where you may be exposed to hepatitis A.  Hepatitis B vaccine. You may need this if you have certain conditions or if you travel or work in places where you may be exposed to hepatitis B.  Haemophilus influenzae type b (Hib) vaccine. You may need this if you have certain conditions. Talk to your health care provider about which screenings and vaccines you need and how often you need them. This information is not intended to replace advice given to you by your health care provider. Make sure you discuss any questions you have with your health care provider. Document Released: 07/08/2015 Document Revised: 08/01/2017 Document Reviewed: 04/12/2015 Elsevier Interactive Patient Education  2019 [employer].

## 2018-12-02 NOTE — Telephone Encounter (Signed)
Pt has not been contacted by [facility] yet She is doing well would like to do PT outpatient, husband is home and able to help her get to appts Please advise

## 2018-12-03 NOTE — Telephone Encounter (Signed)
[facility] will be admitting pt to PT services

## 2018-12-11 ENCOUNTER — Other Ambulatory Visit: Payer: Self-pay

## 2018-12-12 ENCOUNTER — Encounter: Payer: Self-pay | Admitting: Family Medicine

## 2018-12-12 ENCOUNTER — Ambulatory Visit (INDEPENDENT_AMBULATORY_CARE_PROVIDER_SITE_OTHER): Payer: Medicare Other | Admitting: Family Medicine

## 2018-12-12 VITALS — BP 131/82 | HR 100 | Temp 98.2°F | Ht 66.0 in | Wt 166.8 lb

## 2018-12-12 DIAGNOSIS — E039 Hypothyroidism, unspecified: Secondary | ICD-10-CM

## 2018-12-12 DIAGNOSIS — I619 Nontraumatic intracerebral hemorrhage, unspecified: Secondary | ICD-10-CM | POA: Diagnosis not present

## 2018-12-12 DIAGNOSIS — Z79899 Other long term (current) drug therapy: Secondary | ICD-10-CM

## 2018-12-12 DIAGNOSIS — I10 Essential (primary) hypertension: Secondary | ICD-10-CM

## 2018-12-12 DIAGNOSIS — F32A Depression, unspecified: Secondary | ICD-10-CM

## 2018-12-12 DIAGNOSIS — I611 Nontraumatic intracerebral hemorrhage in hemisphere, cortical: Secondary | ICD-10-CM | POA: Diagnosis not present

## 2018-12-12 DIAGNOSIS — S06360S Traumatic hemorrhage of cerebrum, unspecified, without loss of consciousness, sequela: Secondary | ICD-10-CM

## 2018-12-12 DIAGNOSIS — F329 Major depressive disorder, single episode, unspecified: Secondary | ICD-10-CM

## 2018-12-12 DIAGNOSIS — S06330S Contusion and laceration of cerebrum, unspecified, without loss of consciousness, sequela: Secondary | ICD-10-CM

## 2018-12-12 DIAGNOSIS — F419 Anxiety disorder, unspecified: Secondary | ICD-10-CM

## 2018-12-12 DIAGNOSIS — R531 Weakness: Secondary | ICD-10-CM

## 2018-12-12 DIAGNOSIS — S06360D Traumatic hemorrhage of cerebrum, unspecified, without loss of consciousness, subsequent encounter: Secondary | ICD-10-CM

## 2018-12-12 MED ORDER — METOPROLOL SUCCINATE ER 25 MG PO TB24: 25.0000 mg | ORAL_TABLET | Freq: Every day | ORAL | 3 refills | Status: DC

## 2018-12-12 MED ORDER — ROSUVASTATIN CALCIUM 10 MG PO TABS: 10.0000 mg | ORAL_TABLET | Freq: Every day | ORAL | 3 refills | Status: DC

## 2018-12-12 MED ORDER — TIZANIDINE HCL 4 MG PO TABS: 4.0000 mg | ORAL_TABLET | Freq: Every day | ORAL | 3 refills | Status: DC

## 2018-12-12 MED ORDER — LEVOTHYROXINE SODIUM 50 MCG PO TABS: 50.0000 ug | ORAL_TABLET | Freq: Every day | ORAL | 3 refills | Status: DC

## 2018-12-12 MED ORDER — BENAZEPRIL HCL 10 MG PO TABS: 10.0000 mg | ORAL_TABLET | Freq: Two times a day (BID) | ORAL | 3 refills | Status: DC

## 2018-12-12 MED ORDER — PANTOPRAZOLE SODIUM 40 MG PO TBEC: 40.0000 mg | DELAYED_RELEASE_TABLET | Freq: Every day | ORAL | 3 refills | Status: DC

## 2018-12-12 MED ORDER — SENNOSIDES-DOCUSATE SODIUM 8.6-50 MG PO TABS: 2.0000 | ORAL_TABLET | Freq: Every day | ORAL | 1 refills | Status: DC

## 2018-12-12 MED ORDER — DULOXETINE HCL 30 MG PO CPEP: 30.0000 mg | ORAL_CAPSULE | Freq: Every day | ORAL | 3 refills | Status: DC

## 2018-12-12 MED ORDER — ALPRAZOLAM 0.5 MG PO TABS: 0.5000 mg | ORAL_TABLET | Freq: Three times a day (TID) | ORAL | 2 refills | Status: DC | PRN

## 2018-12-12 NOTE — Progress Notes (Signed)
BP 131/82   Pulse 100   Temp 98.2 F (36.8 C) (Oral)   Ht '5\' 6"'  (1.676 m)   Wt 166 lb 12.8 oz (75.7 kg)   LMP 08/28/2016 (Approximate) Comment: Pt reports being unknown when she had her last period, but had spotting two days ago.   BMI 26.92 kg/m    Subjective:   Patient ID: Wendy Collier, female    DOB: Oct 26, 1965, 53 y.o.   MRN: [id number]  HPI: Wendy Collier is a 53 y.o. female presenting on 12/12/2018 for Hypertension (2 week follow up)   HPI Hypertension Patient is currently on benazepril and metoprolol, and their blood pressure today is 131/82. Patient denies any lightheadedness or dizziness. Patient denies headaches, blurred vision, chest pains, shortness of breath, or weakness. Denies any side effects from medication and is content with current medication.  Hypothyroidism recheck Patient is coming in for thyroid recheck today as well. They deny any issues with hair changes or heat or cold problems or diarrhea or constipation. They deny any chest pain or palpitations. They are currently on levothyroxine 39mcrograms   Anxiety depression and bipolar Sees them regularly and just needs refills of some of her medications stressed.  She currently takes Xanax and Cymbalta and Rexulti and is tapering herself off Rexulti because of expense.  Patient denies any suicidal ideations or thoughts of hurting self and she feels like she is doing well even though just recently had an intracranial hemorrhage that caused her to have have a remnant right-sided weakness but she is working with therapy and getting stronger.  Patient signed contract today and to do urine drug screen for controlled substance of Xanax.  Relevant past medical, surgical, family and social history reviewed and updated as indicated. Interim medical history since our last visit reviewed. Allergies and medications reviewed and updated.  Review of Systems  Constitutional: Negative for chills and fever.  Eyes: Negative for  redness and visual disturbance.  Respiratory: Negative for chest tightness and shortness of breath.   Cardiovascular: Negative for chest pain and leg swelling.  Musculoskeletal: Negative for back pain and gait problem.  Skin: Negative for rash.  Neurological: Positive for weakness (Right-sided weakness but improving). Negative for light-headedness and headaches.  Psychiatric/Behavioral: Positive for dysphoric mood. Negative for agitation, behavioral problems, self-injury, sleep disturbance and suicidal ideas. The patient is nervous/anxious.   All other systems reviewed and are negative.   Per HPI unless specifically indicated above   Allergies as of 12/12/2018      Reactions   Silver Rash   Tegaderm Tegaderm Tegaderm Tegaderm Tegaderm Tegaderm      Medication List       Accurate as of December 12, 2018  2:35 PM. If you have any questions, ask your nurse or doctor.        ALPRAZolam 0.5 MG tablet Commonly known as: XANAX Take 1 tablet (0.5 mg total) by mouth 3 (three) times daily as needed for anxiety.   benazepril 10 MG tablet Commonly known as: LOTENSIN Take 1 tablet (10 mg total) by mouth 2 (two) times daily.   DULoxetine 30 MG capsule Commonly known as: CYMBALTA Take 1 capsule (30 mg total) by mouth daily. With two 20 mg capsules   levothyroxine 50 MCG tablet Commonly known as: SYNTHROID Take 1 tablet (50 mcg total) by mouth daily before breakfast.   metoprolol succinate 25 MG 24 hr tablet Commonly known as: TOPROL-XL Take 1 tablet (25 mg total) by mouth daily.   multivitamin  with minerals tablet Take 1 tablet by mouth daily.   pantoprazole 40 MG tablet Commonly known as: PROTONIX Take 1 tablet (40 mg total) by mouth at bedtime.   Rexulti 2 MG Tabs Generic drug: Brexpiprazole Take 1 tablet by mouth daily.   rosuvastatin 10 MG tablet Commonly known as: Crestor Take 1 tablet (10 mg total) by mouth daily.   senna-docusate 8.6-50 MG tablet Commonly known  as: Senokot-S Take 2 tablets by mouth at bedtime.   tiZANidine 4 MG tablet Commonly known as: ZANAFLEX Take 1 tablet (4 mg total) by mouth at bedtime.        Objective:   BP 131/82   Pulse 100   Temp 98.2 F (36.8 C) (Oral)   Ht '5\' 6"'  (1.676 m)   Wt 166 lb 12.8 oz (75.7 kg)   LMP 08/28/2016 (Approximate) Comment: Pt reports being unknown when she had her last period, but had spotting two days ago.   BMI 26.92 kg/m   Wt Readings from Last 3 Encounters:  12/12/18 166 lb 12.8 oz (75.7 kg)  11/17/18 160 lb 4.4 oz (72.7 kg)  11/14/18 164 lb 7.4 oz (74.6 kg)    Physical Exam Vitals signs and nursing note reviewed.  Constitutional:      General: She is not in acute distress.    Appearance: She is well-developed. She is not diaphoretic.  Eyes:     Conjunctiva/sclera: Conjunctivae normal.  Cardiovascular:     Rate and Rhythm: Normal rate and regular rhythm.     Heart sounds: Normal heart sounds. No murmur.  Pulmonary:     Effort: Pulmonary effort is normal. No respiratory distress.     Breath sounds: Normal breath sounds. No wheezing.  Musculoskeletal: Normal range of motion.        General: No tenderness.  Skin:    General: Skin is warm and dry.     Findings: No rash.  Neurological:     Mental Status: She is alert and oriented to person, place, and time.     Motor: Weakness (Still has right-sided weakness but is improving and went from wheelchair last time to now she is using a cane this time) present.     Coordination: Coordination normal.     Gait: Gait abnormal.  Psychiatric:        Mood and Affect: Mood is not anxious or depressed.        Behavior: Behavior normal.        Thought Content: Thought content does not include suicidal ideation. Thought content does not include suicidal plan.     Results for orders placed or performed in visit on 11/21/18  CBC with Differential/Platelet  Result Value Ref Range   WBC 9.3 3.4 - 10.8 x10E3/uL   RBC 4.95 3.77 - 5.28  x10E6/uL   Hemoglobin 14.6 11.1 - 15.9 g/dL   Hematocrit 42.5 34.0 - 46.6 %   MCV 86 79 - 97 fL   MCH 29.5 26.6 - 33.0 pg   MCHC 34.4 31.5 - 35.7 g/dL   RDW 12.6 11.7 - 15.4 %   Platelets 305 150 - 450 x10E3/uL   Neutrophils 56 Not Estab. %   Lymphs 29 Not Estab. %   Monocytes 9 Not Estab. %   Eos 5 Not Estab. %   Basos 1 Not Estab. %   Neutrophils Absolute 5.3 1.4 - 7.0 x10E3/uL   Lymphocytes Absolute 2.7 0.7 - 3.1 x10E3/uL   Monocytes Absolute 0.8 0.1 - 0.9 x10E3/uL  EOS (ABSOLUTE) 0.4 0.0 - 0.4 x10E3/uL   Basophils Absolute 0.1 0.0 - 0.2 x10E3/uL   Immature Granulocytes 0 Not Estab. %   Immature Grans (Abs) 0.0 0.0 - 0.1 x10E3/uL  CMP14+EGFR  Result Value Ref Range   Glucose 98 65 - 99 mg/dL   BUN 15 6 - 24 mg/dL   Creatinine, Ser 0.82 0.57 - 1.00 mg/dL   GFR calc non Af Amer 84 >59 mL/min/1.73   GFR calc Af Amer 96 >59 mL/min/1.73   BUN/Creatinine Ratio 18 9 - 23   Sodium 140 134 - 144 mmol/L   Potassium 5.2 3.5 - 5.2 mmol/L   Chloride 98 96 - 106 mmol/L   CO2 25 20 - 29 mmol/L   Calcium 10.3 (H) 8.7 - 10.2 mg/dL   Total Protein 7.0 6.0 - 8.5 g/dL   Albumin 4.8 3.8 - 4.8 g/dL   Globulin, Total 2.2 1.5 - 4.5 g/dL   Albumin/Globulin Ratio 2.2 1.2 - 2.2   Bilirubin Total 0.2 0.0 - 1.2 mg/dL   Alkaline Phosphatase 115 39 - 117 IU/L   AST 22 0 - 40 IU/L   ALT 20 0 - 32 IU/L  Lipid panel  Result Value Ref Range   Cholesterol, Total 157 100 - 199 mg/dL   Triglycerides 216 (H) 0 - 149 mg/dL   HDL 42 >39 mg/dL   VLDL Cholesterol Cal 43 (H) 5 - 40 mg/dL   LDL Calculated 72 0 - 99 mg/dL   Chol/HDL Ratio 3.7 0.0 - 4.4 ratio    Assessment & Plan:   Problem List Items Addressed This Visit      Cardiovascular and Mediastinum   Essential hypertension - Primary   Relevant Medications   benazepril (LOTENSIN) 10 MG tablet   metoprolol succinate (TOPROL-XL) 25 MG 24 hr tablet   rosuvastatin (CRESTOR) 10 MG tablet     Endocrine   Hypothyroidism   Relevant Medications    levothyroxine (SYNTHROID) 50 MCG tablet   metoprolol succinate (TOPROL-XL) 25 MG 24 hr tablet     Nervous and Auditory   ICH (intracerebral hemorrhage) (HCC)   Relevant Medications   benazepril (LOTENSIN) 10 MG tablet   metoprolol succinate (TOPROL-XL) 25 MG 24 hr tablet   Intraparenchymal hematoma of brain (HCC)   Relevant Medications   benazepril (LOTENSIN) 10 MG tablet   metoprolol succinate (TOPROL-XL) 25 MG 24 hr tablet     Other   Anxiety and depression   Relevant Medications   ALPRAZolam (XANAX) 0.5 MG tablet   DULoxetine (CYMBALTA) 30 MG capsule   Other Relevant Orders   ToxASSURE Select 13 (MW), Urine   Controlled substance agreement signed    Other Visit Diagnoses    Right sided weakness       Relevant Medications   benazepril (LOTENSIN) 10 MG tablet   metoprolol succinate (TOPROL-XL) 25 MG 24 hr tablet      Continue current medication, will patient signed pain contract, PDMP was reviewed.  Patient is working with physical therapy for right-sided weakness status post intracranial hemorrhage that is improving.  Continue Cymbalta and Xanax for anxiety.  Continue levothyroxine and blood pressure medication.  Her blood pressures appears to be doing well today.  Follow up plan: Return in about 3 months (around 03/14/2019), or if symptoms worsen or fail to improve, for Recheck anxiety and hypertension.  Counseling provided for all of the vaccine components Orders Placed This Encounter  Procedures  . ToxASSURE Select 13 (MW), Urine  Caryl Pina, MD [facility] 12/12/2018, 2:35 PM

## 2018-12-15 ENCOUNTER — Encounter: Payer: Self-pay | Admitting: Family Medicine

## 2018-12-15 ENCOUNTER — Other Ambulatory Visit: Payer: Self-pay | Admitting: Physical Medicine and Rehabilitation

## 2018-12-15 ENCOUNTER — Other Ambulatory Visit: Payer: Self-pay

## 2018-12-15 ENCOUNTER — Ambulatory Visit (INDEPENDENT_AMBULATORY_CARE_PROVIDER_SITE_OTHER): Payer: Medicare Other

## 2018-12-15 DIAGNOSIS — F319 Bipolar disorder, unspecified: Secondary | ICD-10-CM

## 2018-12-15 DIAGNOSIS — I69112 Visuospatial deficit and spatial neglect following nontraumatic intracerebral hemorrhage: Secondary | ICD-10-CM | POA: Diagnosis not present

## 2018-12-15 DIAGNOSIS — M47812 Spondylosis without myelopathy or radiculopathy, cervical region: Secondary | ICD-10-CM

## 2018-12-15 DIAGNOSIS — Z9181 History of falling: Secondary | ICD-10-CM

## 2018-12-15 DIAGNOSIS — I69151 Hemiplegia and hemiparesis following nontraumatic intracerebral hemorrhage affecting right dominant side: Secondary | ICD-10-CM | POA: Diagnosis not present

## 2018-12-15 DIAGNOSIS — F909 Attention-deficit hyperactivity disorder, unspecified type: Secondary | ICD-10-CM

## 2018-12-15 DIAGNOSIS — I10 Essential (primary) hypertension: Secondary | ICD-10-CM | POA: Diagnosis not present

## 2018-12-15 DIAGNOSIS — E039 Hypothyroidism, unspecified: Secondary | ICD-10-CM

## 2018-12-15 DIAGNOSIS — G8929 Other chronic pain: Secondary | ICD-10-CM

## 2018-12-15 DIAGNOSIS — F419 Anxiety disorder, unspecified: Secondary | ICD-10-CM

## 2018-12-15 LAB — TOXASSURE SELECT 13 (MW), URINE

## 2018-12-16 ENCOUNTER — Telehealth: Payer: Self-pay

## 2018-12-16 NOTE — Telephone Encounter (Signed)
Left vm for patient to call back about her appt on 6/252020. It will be change to mychart video due to COVID 19. Pt already has a mychart account with [facility]. If pt has cell phone or any device with camera that will be sufficient. Need verbal consent to do video and file insurance.

## 2018-12-17 NOTE — Telephone Encounter (Signed)
LEft vm for patient that appt will be cancel for 12/18/2018 due to not able to reach. I stated visit will be cancel and to call back to r/s. I stated we are doing mychart video visit due to COVID 19.

## 2018-12-18 ENCOUNTER — Inpatient Hospital Stay: Payer: Medicare Other | Admitting: Adult Health

## 2018-12-20 ENCOUNTER — Encounter: Payer: Self-pay | Admitting: Family Medicine

## 2018-12-20 DIAGNOSIS — L600 Ingrowing nail: Secondary | ICD-10-CM

## 2018-12-23 ENCOUNTER — Telehealth: Payer: Self-pay | Admitting: *Deleted

## 2018-12-23 NOTE — Telephone Encounter (Signed)
Verbal given  - per pt would like to hold PT  This week and resume next week

## 2018-12-24 ENCOUNTER — Ambulatory Visit (INDEPENDENT_AMBULATORY_CARE_PROVIDER_SITE_OTHER): Payer: Medicare Other | Admitting: Family Medicine

## 2018-12-24 ENCOUNTER — Other Ambulatory Visit: Payer: Self-pay

## 2018-12-24 ENCOUNTER — Encounter: Payer: Self-pay | Admitting: Family Medicine

## 2018-12-24 DIAGNOSIS — G4700 Insomnia, unspecified: Secondary | ICD-10-CM

## 2018-12-24 MED ORDER — DOXEPIN HCL 3 MG PO TABS: 3.0000 mg | ORAL_TABLET | Freq: Every evening | ORAL | 1 refills | Status: DC | PRN

## 2018-12-24 NOTE — Progress Notes (Signed)
Virtual Visit via telephone Note  I connected with Wendy Collier on 12/24/18 at [facility] by telephone and verified that I am speaking with the correct person using two identifiers. Wendy Collier is currently located at home and no other people are currently with her during visit. The provider, Fransisca Kaufmann Dettinger, MD is located in their office at time of visit.  Call ended at [phone]  I discussed the limitations, risks, security and privacy concerns of performing an evaluation and management service by telephone and the availability of in person appointments. I also discussed with the patient that there may be a patient responsible charge related to this service. The patient expressed understanding and agreed to proceed.   History and Present Illness: Patient is calling in complaining of difficulty sleeping.  She will wake up 2 or 3 times per night.  She has trouble falling asleep because of shutting her mind down. She denies gasping and she does snore sometimes.  Her husband has not noticed any stopping breathing when she sleeps.  She has tried [place] and trazodone and they worked but then stopped working, the trazodone caused headaches and Ambien just stopped working after a little bit.  She is also tried melatonin in the past without much success.  No diagnosis found.  Outpatient Encounter Medications as of 12/24/2018  Medication Sig  . ALPRAZolam (XANAX) 0.5 MG tablet Take 1 tablet (0.5 mg total) by mouth 3 (three) times daily as needed for anxiety.  . benazepril (LOTENSIN) 10 MG tablet Take 1 tablet (10 mg total) by mouth 2 (two) times daily.  . Brexpiprazole (REXULTI) 2 MG TABS Take 1 tablet by mouth daily.   . DULoxetine (CYMBALTA) 30 MG capsule Take 1 capsule (30 mg total) by mouth daily. With two 20 mg capsules  . levothyroxine (SYNTHROID) 50 MCG tablet Take 1 tablet (50 mcg total) by mouth daily before breakfast.  . metoprolol succinate (TOPROL-XL) 25 MG 24 hr tablet Take 1 tablet (25 mg  total) by mouth daily.  . Multiple Vitamins-Minerals (MULTIVITAMIN WITH MINERALS) tablet Take 1 tablet by mouth daily.  . pantoprazole (PROTONIX) 40 MG tablet Take 1 tablet (40 mg total) by mouth at bedtime.  . rosuvastatin (CRESTOR) 10 MG tablet Take 1 tablet (10 mg total) by mouth daily.  Marland Kitchen senna-docusate (SENOKOT-S) 8.6-50 MG tablet Take 2 tablets by mouth at bedtime.  Marland Kitchen tiZANidine (ZANAFLEX) 4 MG tablet Take 1 tablet (4 mg total) by mouth at bedtime.   No facility-administered encounter medications on file as of 12/24/2018.     Review of Systems  Constitutional: Negative for chills and fever.  Eyes: Negative for visual disturbance.  Respiratory: Negative for chest tightness and shortness of breath.   Cardiovascular: Negative for chest pain and leg swelling.  Neurological: Negative for light-headedness and headaches.  Psychiatric/Behavioral: Positive for sleep disturbance. Negative for agitation, behavioral problems, dysphoric mood, self-injury and suicidal ideas. The patient is nervous/anxious.   All other systems reviewed and are negative.   Observations/Objective: Patient sounds comfortable and in no acute distress  Assessment and Plan: Problem List Items Addressed This Visit    None    Visit Diagnoses    Insomnia, unspecified type    -  Primary   Relevant Medications   Doxepin HCl 3 MG TABS       Follow Up Instructions:  Follow-up in 1 month for recheck of insomnia new medication   I discussed the assessment and treatment plan with the patient. The patient was provided  an opportunity to ask questions and all were answered. The patient agreed with the plan and demonstrated an understanding of the instructions.   The patient was advised to call back or seek an in-person evaluation if the symptoms worsen or if the condition fails to improve as anticipated.  The above assessment and management plan was discussed with the patient. The patient verbalized understanding of and  has agreed to the management plan. Patient is aware to call the clinic if symptoms persist or worsen. Patient is aware when to return to the clinic for a follow-up visit. Patient educated on when it is appropriate to go to the emergency department.    I provided 15 minutes of non-face-to-face time during this encounter.    Nils PyleJoshua A Dettinger, MD

## 2018-12-25 ENCOUNTER — Telehealth: Payer: Self-pay

## 2018-12-25 ENCOUNTER — Telehealth: Payer: Self-pay | Admitting: Family Medicine

## 2018-12-25 NOTE — Telephone Encounter (Signed)
This request has been approved using information available on the patient's profile. YQMGNO:[id number];BVQXIH:[id number];Review Type:Prior Auth;Coverage Start Date:11/25/2018;Coverage End Date:12/25/2019;

## 2018-12-25 NOTE — Telephone Encounter (Signed)
lmtcb- I do not see where anyone has tried to call patient today.

## 2018-12-25 NOTE — Telephone Encounter (Signed)
Pharmacy aware PA went through but med will still cost $284, will call pt to see if she wants to get it. If it is too expensive pharmacist says the Doxepin 10mg  capsule is much cheaper and probably wouldn't need a PA.  Pt didn't answer phone and couldn't leave VM.

## 2018-12-26 ENCOUNTER — Telehealth: Payer: Self-pay | Admitting: Family Medicine

## 2018-12-26 ENCOUNTER — Encounter: Payer: Self-pay | Admitting: Family Medicine

## 2018-12-26 DIAGNOSIS — G4700 Insomnia, unspecified: Secondary | ICD-10-CM

## 2018-12-26 MED ORDER — DOXEPIN HCL 10 MG PO CAPS: 10.0000 mg | ORAL_CAPSULE | Freq: Every day | ORAL | 1 refills | Status: DC

## 2018-12-26 NOTE — Telephone Encounter (Signed)
Please let the patient know that I have attempted to send the 10 mg capsule and see if it is covered.

## 2018-12-26 NOTE — Telephone Encounter (Signed)
Patient aware.

## 2018-12-26 NOTE — Telephone Encounter (Signed)
Pharmacy aware PA went through but med will still cost $284, will call pt to see if she wants to get it. If it is too expensive pharmacist says the Doxepin 10mg  capsule is much cheaper and probably wouldn't need a PA.  Pt didn't answer phone and couldn't leave VM    DR Dettinger - see note

## 2018-12-29 ENCOUNTER — Telehealth: Payer: Self-pay | Admitting: Family Medicine

## 2018-12-29 MED ORDER — DOXEPIN HCL 3 MG PO TABS: 3.0000 mg | ORAL_TABLET | Freq: Every evening | ORAL | 1 refills | Status: DC | PRN

## 2018-12-29 NOTE — Telephone Encounter (Signed)
I already sent the 10 mg tablets last week, I do not know why I am getting this message again.

## 2018-12-29 NOTE — Telephone Encounter (Signed)
Patient aware Rx was sent to [facility]

## 2019-01-07 ENCOUNTER — Encounter: Payer: Self-pay | Admitting: Family Medicine

## 2019-01-08 MED ORDER — DOXEPIN HCL 25 MG PO CAPS: 25.0000 mg | ORAL_CAPSULE | Freq: Every day | ORAL | 1 refills | Status: DC

## 2019-01-18 ENCOUNTER — Encounter: Payer: Self-pay | Admitting: Family Medicine

## 2019-01-19 MED ORDER — RAMELTEON 8 MG PO TABS: 8.0000 mg | ORAL_TABLET | Freq: Every day | ORAL | 1 refills | Status: DC

## 2019-01-20 ENCOUNTER — Telehealth: Payer: Self-pay | Admitting: Adult Health

## 2019-01-20 ENCOUNTER — Encounter: Payer: Self-pay | Admitting: Adult Health

## 2019-01-20 ENCOUNTER — Other Ambulatory Visit: Payer: Self-pay

## 2019-01-20 ENCOUNTER — Ambulatory Visit (INDEPENDENT_AMBULATORY_CARE_PROVIDER_SITE_OTHER): Payer: Medicare Other | Admitting: Adult Health

## 2019-01-20 VITALS — BP 128/89 | HR 101 | Temp 98.0°F | Ht 66.0 in | Wt 173.0 lb

## 2019-01-20 DIAGNOSIS — S06350D Traumatic hemorrhage of left cerebrum without loss of consciousness, subsequent encounter: Secondary | ICD-10-CM | POA: Diagnosis not present

## 2019-01-20 DIAGNOSIS — I10 Essential (primary) hypertension: Secondary | ICD-10-CM

## 2019-01-20 DIAGNOSIS — E781 Pure hyperglyceridemia: Secondary | ICD-10-CM | POA: Diagnosis not present

## 2019-01-20 DIAGNOSIS — S06320D Contusion and laceration of left cerebrum without loss of consciousness, subsequent encounter: Secondary | ICD-10-CM

## 2019-01-20 DIAGNOSIS — R5383 Other fatigue: Secondary | ICD-10-CM

## 2019-01-20 NOTE — Patient Instructions (Addendum)
We will repeat MRI w/wo contrast to assess for possible underlying causes of a recent bleed -you will be called to schedule this visit  Continue to follow up with PCP regarding cholesterol and blood pressure management -it is okay to continue Lovaza for elevated triglycerides and please ensure that you continue healthy diet with limiting cholesterol containing foods  Continue to monitor blood pressure at home  Referral placed to [place] [facility] for further evaluation of potential sleep apnea -you will be called to schedule this visit  Maintain strict control of hypertension with blood pressure goal below 130/90, diabetes with hemoglobin A1c goal below 6.5% and cholesterol with LDL cholesterol (bad cholesterol) goal below 70 mg/dL. I also advised the patient to eat a healthy diet with plenty of whole grains, cereals, fruits and vegetables, exercise regularly and maintain ideal body weight.  Followup in the future with me in 3 months or call earlier if needed       Thank you for coming to see [place] at [facility]. I hope we have been able to provide you high quality care today.  You may receive a patient satisfaction survey over the next few weeks. We would appreciate your feedback and comments so that we may continue to improve ourselves and the health of our patients.

## 2019-01-20 NOTE — Telephone Encounter (Signed)
medicare order sent to GI. No auth they will reach out to the patient to schedule.  

## 2019-01-20 NOTE — Progress Notes (Signed)
[facility] [address] [place] [id number] [phone]       HOSPITAL FOLLOW UP NOTE  Ms. Wendy HowellsAngela Collier Date of Birth:  28-Mar-1966 Medical Record Number:  [id number]   Reason for Referral:  hospital stroke follow up    CHIEF COMPLAINT:  Chief Complaint  Patient presents with   Follow-up    Treatment room, alone. Hospital-stroke f/u. states that she is doing well, but has some fatigue.    HPI: Wendy Collier being seen today for in office hospital follow-up regarding intraparenchymal hematoma within the left parietal lobe on 11/17/2018.  History obtained from patient, husband and chart review. Reviewed all radiology images and labs personally.  Ms. Wendy Howellsngela Collier is a 53 y.o. female with history of migraines, fibromyalgia, bipolar, hypothyroidism, depression, and psychiatric disorder  who [facility]2018 with headache and confusion followed by reduced vision on theright sideand right leg weakness.   Stroke work-up revealed intraparenchymal hematoma within the left parietal lobe likely secondary to untreated HTN with excessive caffeine and sympathomimetics including Monster drink, Dr. Alcus Dadpeppers and Adderall with resultant mild right hemiparesis and sensory deficit, hemianopia and ataxia right arm and leg.  She did not receive IV t-PA due to hemorrhage.  MRI/MRV head unchanged appearance of intraparenchymal hematoma within left parietal lobe with mild associated mass-effect without abnormal contrast enhancement or dural venous sinus thrombosis.  Recommended follow-up MRI w/wo contrast with susceptibility weighted imaging in 4 to 6 weeks.  CTA head and neck showed lobar intraparenchymal hemorrhage within left parietal lobe with volume of 33 mL, moderate edema with mild mass-effect on the left occipital lobe and small amount of subdural extension of hematoma along the posterior falx cerebri.  Repeat CT head negative for significant change in the left  parietal hemorrhage with stable 5 mm left to right shift.  No further evaluation indicated.  Carotid Doppler 2D echo not indicated.  Blood pressure normalized with use of Cleviprex and initiated metoprolol and lisinopril at discharge.  Other stroke risk factors include migraines but no prior history of stroke.  [facility].  Denies residual deficits and overall has recovered well.  She does endorse daytime fatigue but has also discontinued use of her daily Adderall and limiting caffeine intake.  She is able to maintain daily functioning and activity She has returned back to all prior activities without difficulty Blood pressure stable 128/89 -she does monitor at home and has been stable ongoing Was recently started on lovaza 2mg  daily for elevated triglyceride level recent lipid panel showing triglycerides 216 and LDL 72 She does have chronic insomnia which she has been working with her PCP for medication management to assist with this condition She does endorse snoring -has not previously underwent sleep study Underlying history of bipolar and continues to follow with psychiatry for ongoing management   ROS:   14 system review of systems performed and negative with exception of fatigue  PMH:  Past Medical History:  Diagnosis Date   Anxiety disorder    with panic attacks.    Arthritis    Bipolar 1 disorder (HCC)    Chronic fatigue    Chronic pain    neck/back with spasms.    Depression    Fibromyalgia    Frequent headaches    Hypertension    Thyroid disease    Hypothyroidism     PSH:  Past Surgical History:  Procedure Laterality Date   ANTERIOR CERVICAL DECOMP/DISCECTOMY FUSION  BACK SURGERY     KNEE SURGERY      Social History:  Social History   Socioeconomic History   Marital status: Married    Spouse name: Onalee HuaDavid   Number of children: 2   Years of education: 15   Highest education level: Some college, no degree    Occupational History   Occupation: disability  [employer]ologist[employer] Needs   Financial resource strain: Somewhat hard   Food insecurity    Worry: Never true    Inability: Never true   Transportation needs    Medical: No    Non-medical: No  Tobacco Use   Smoking status: Never Smoker   Smokeless tobacco: Never Used  Substance and Sexual Activity   Alcohol use: No   Drug use: No   Sexual activity: Yes    Birth control/protection: Post-menopausal    Comment: Married since 2010  Lifestyle   Physical activity    Days per week: 7 days    Minutes per session: 30 min   Stress: Only a little  Relationships   Social connections    Talks on phone: More than three times a week    Gets together: More than three times a week    Attends religious service: More than 4 times per year    Active member of club or organization: No    Attends meetings of clubs or organizations: Never    Relationship status: Married   Intimate partner violence    Fear of current or ex partner: No    Emotionally abused: No    Physically abused: No    Forced sexual activity: No  Other Topics Concern   Not on file  Social History Narrative   Not on file    Family History:  Family History  Problem Relation Age of Onset   Alcohol abuse Mother    COPD Mother    Depression Mother    Drug abuse Mother    Early death Mother    Mental illness Mother    Alcohol abuse Father    Cancer Father        kidney   Mental illness Father    Mental illness Brother        schizophrenia    Medications:   Current Outpatient Medications on File Prior to Visit  Medication Sig Dispense Refill   ALPRAZolam (XANAX) 0.5 MG tablet Take 1 tablet (0.5 mg total) by mouth 3 (three) times daily as needed for anxiety. 90 tablet 2   benazepril (LOTENSIN) 10 MG tablet Take 1 tablet (10 mg total) by mouth 2 (two) times daily. 180 tablet 3   Brexpiprazole (REXULTI) 2 MG TABS Take 1 tablet by mouth daily.       DULoxetine (CYMBALTA) 30 MG capsule Take 1 capsule (30 mg total) by mouth daily. With two 20 mg capsules 90 capsule 3   levothyroxine (SYNTHROID) 50 MCG tablet Take 1 tablet (50 mcg total) by mouth daily before breakfast. 90 tablet 3   metoprolol succinate (TOPROL-XL) 25 MG 24 hr tablet Take 1 tablet (25 mg total) by mouth daily. 90 tablet 3   Multiple Vitamins-Minerals (MULTIVITAMIN WITH MINERALS) tablet Take 1 tablet by mouth daily.     pantoprazole (PROTONIX) 40 MG tablet Take 1 tablet (40 mg total) by mouth at bedtime. 90 tablet 3   ramelteon (ROZEREM) 8 MG tablet Take 1 tablet (8 mg total) by mouth at bedtime. 30 tablet 1   rosuvastatin (CRESTOR) 10 MG tablet Take 1 tablet (  10 mg total) by mouth daily. 90 tablet 3   senna-docusate (SENOKOT-S) 8.6-50 MG tablet Take 2 tablets by mouth at bedtime. 60 tablet 1   tiZANidine (ZANAFLEX) 4 MG tablet Take 1 tablet (4 mg total) by mouth at bedtime. 90 tablet 3   ALPRAZolam (XANAX) 0.5 MG tablet 1 tab(s)     No current facility-administered medications on file prior to visit.     Allergies:   Allergies  Allergen Reactions   Silver Rash    Tegaderm Tegaderm Tegaderm Tegaderm Tegaderm Tegaderm      Physical Exam  Vitals:   01/20/19 0928  BP: 128/89  Pulse: (!) 101  Temp: 98 F (36.7 C)  Weight: 173 lb (78.5 kg)  Height: 5\' 6"  (1.676 m)   Body mass index is 27.92 kg/m. No exam data present  General: well developed, well nourished,  pleasant middle-age Caucasian female, seated, in no evident distress Head: head normocephalic and atraumatic.   Neck: supple with no carotid or supraclavicular bruits Cardiovascular: regular rate and rhythm, no murmurs Musculoskeletal: no deformity Skin:  no rash/petichiae Vascular:  Normal pulses all extremities   Neurologic Exam Mental Status: Awake and fully alert. Oriented to place and time. Recent and remote memory intact. Attention span, concentration and fund of knowledge  appropriate. Mood and affect appropriate.  Cranial Nerves: Fundoscopic exam reveals sharp disc margins. Pupils equal, briskly reactive to light. Extraocular movements full without nystagmus. Visual fields full to confrontation. Hearing intact. Facial sensation intact. Face, tongue, palate moves normally and symmetrically.  Motor: Normal bulk and tone. Normal strength in all tested extremity muscles. Sensory.: intact to touch , pinprick , position and vibratory sensation.  Coordination: Rapid alternating movements normal in all extremities. Finger-to-nose and heel-to-shin performed accurately bilaterally. Gait and Station: Arises from chair without difficulty. Stance is normal. Gait demonstrates normal stride length and balance Reflexes: 1+ and symmetric. Toes downgoing.     NIHSS  0 Modified Rankin  0    Diagnostic Data (Labs, Imaging, Testing)  Ct Angio Head W Or Wo Contrast 11/14/2018 IMPRESSION:  1. Lobar intraparenchymal hemorrhage within the left parietal lobe with volume of 33 mL. Moderate edema with mild mass effect on the left occipital lobe.  2. Small amount of subdural extension of hematoma along the posterior falx cerebri.  3. Normal CTA of the intracranial arteries. No aneurysm or vascular malformation.   Mr Jeri Cos And Wo Contrast Mr Mrv Head Wo Cm 11/14/2018 IMPRESSION:  1. Unchanged appearance of intraparenchymal hematoma within the left parietal lobe with mild associated mass effect. No abnormal contrast enhancement.  2. No dural venous sinus thrombosis.  3. The etiology of the hemorrhage remains unclear. Follow up MRI with and without contrast with susceptibility-weighted imaging might be helpful in 4-6 weeks.   EKG - SR rate 80 BPM. (See cardiology reading for complete details)    ASSESSMENT: Shaylynn Nulty is a 53 y.o. year old female here with intraparenchymal hematoma within the left parietal lobe on 11/17/2018 secondary to likely HTN with excessive caffeine  and sympathomimetic use. Vascular risk factors include HTN and migraines.  She has recovered well from a stroke standpoint without residual deficits or reoccurring of symptoms.  She does complain of daytime fatigue.    PLAN:  1. IPH left parietal lobe: Maintain strict control of hypertension with blood pressure goal below 130/90, diabetes with hemoglobin A1c goal below 6.5% and cholesterol with LDL cholesterol (bad cholesterol) goal below 70 mg/dL.  I also advised the patient  to eat a healthy diet with plenty of whole grains, cereals, fruits and vegetables, exercise regularly with at least 30 minutes of continuous activity daily and maintain ideal body weight.  Stroke etiology likely uncontrolled HTN but will obtain MRI w/wo as recommended to ensure no underlying process of potential IPH etiology 2. HTN: Advised to continue current treatment regimen.  Today's BP stable.  Advised to continue to monitor at home along with continued follow-up with PCP for management 3. Elevated triglycerides: Okay to continue Lovaza 2 mg daily for triglyceride management along with maintaining a healthy diet.  Crestor currently on her list of medications but not indicated at this time with satisfactory LDL 72. 4. Daytime fatigue: Likely multifactorial from recent stroke and discontinuation of stimulants including Adderall and caffeine intake.  As she does have underlying history of insomnia and snoring, referral placed to [facility] sleep center for evaluation of potential underlying sleep apnea     Follow up in 3 months or call earlier if needed   Greater than 50% of time during this 45 minute visit was spent on counseling, explanation of diagnosis of IPH, reviewing risk factor management of HTN, discussion regarding potential underlying sleep apnea and increased risk if untreated, discussion regarding elevated triglycerides and ongoing management, planning of further management along with potential future management, and  discussion with patient and family answering all questions.    George HughJessica Trace Wirick, AGNP-BC  [facility] [address] [place], [place] [id number]  Phone [phone] Fax [phone] Note: This document was prepared with digital dictation and possible smart phrase technology. Any transcriptional errors that result from this process are unintentional.

## 2019-01-21 NOTE — Progress Notes (Signed)
I agree with the above plan 

## 2019-01-26 ENCOUNTER — Other Ambulatory Visit: Payer: Self-pay | Admitting: Family

## 2019-01-26 NOTE — Progress Notes (Signed)
See My chart message

## 2019-01-29 NOTE — Progress Notes (Signed)
I agreed to go ahead and do a referral to a sleep specialist for the patient.

## 2019-01-29 NOTE — Progress Notes (Signed)
Patient has read message to call office for sleep study per My Chart.

## 2019-02-08 ENCOUNTER — Encounter: Payer: Self-pay | Admitting: Family Medicine

## 2019-02-10 MED ORDER — ZOLPIDEM TARTRATE ER 12.5 MG PO TBCR: 12.5000 mg | EXTENDED_RELEASE_TABLET | Freq: Every evening | ORAL | 1 refills | Status: DC | PRN

## 2019-02-11 ENCOUNTER — Institutional Professional Consult (permissible substitution): Payer: Medicare Other | Admitting: Neurology

## 2019-02-12 ENCOUNTER — Encounter: Payer: Self-pay | Admitting: Neurology

## 2019-02-18 ENCOUNTER — Telehealth: Payer: Self-pay | Admitting: Family Medicine

## 2019-02-19 NOTE — Telephone Encounter (Signed)
[facility] refill sent in 12/12/18 #90 with 3 RFs

## 2019-02-23 ENCOUNTER — Ambulatory Visit (INDEPENDENT_AMBULATORY_CARE_PROVIDER_SITE_OTHER): Payer: Medicare Other | Admitting: Neurology

## 2019-02-23 ENCOUNTER — Encounter: Payer: Self-pay | Admitting: Neurology

## 2019-02-23 ENCOUNTER — Other Ambulatory Visit: Payer: Self-pay

## 2019-02-23 VITALS — BP 136/93 | HR 112 | Ht 66.0 in | Wt 172.0 lb

## 2019-02-23 DIAGNOSIS — I10 Essential (primary) hypertension: Secondary | ICD-10-CM | POA: Diagnosis not present

## 2019-02-23 DIAGNOSIS — E663 Overweight: Secondary | ICD-10-CM

## 2019-02-23 DIAGNOSIS — R0683 Snoring: Secondary | ICD-10-CM

## 2019-02-23 DIAGNOSIS — S06320D Contusion and laceration of left cerebrum without loss of consciousness, subsequent encounter: Secondary | ICD-10-CM

## 2019-02-23 DIAGNOSIS — S06350D Traumatic hemorrhage of left cerebrum without loss of consciousness, subsequent encounter: Secondary | ICD-10-CM | POA: Diagnosis not present

## 2019-02-23 DIAGNOSIS — G4719 Other hypersomnia: Secondary | ICD-10-CM | POA: Diagnosis not present

## 2019-02-23 NOTE — Progress Notes (Signed)
Subjective:    Patient ID: Wendy Collier is a 53 y.o. female.  HPI     Wendy FoleySaima Blair Lundeen, MD, PhD [facility] [address], [address] [address] [place], [place] [address]  Dear Wendy BumpsJessica,   I saw your patient, Wendy Collier, upon your kind request in my sleep clinic today for initial consultation of her sleep disorder, in particular, concern for underlying obstructive sleep apnea.  The patient is accompanied by her husband today.  As you know Ms. [name] is a [age] year old right-handed woman with an underlying medical history of left parietal intraparenchymal hemorrhage in May 2020, deemed secondary to hypertension, history of fibromyalgia, depression, chronic pain, anxiety, thyroid disease and overweight state, who reports some snoring and excessive daytime somnolence.  I reviewed your office note from 01/20/2019.  She has had difficulty initiating sleep.  This has been going on for years.  She is currently on generic Ambien long-acting 12.5 mg each night.  She has also taken tizanidine at night which helps her sleep but she has noticed some more sleep talking when she takes it.  Since her stroke she has done fairly well as far as weakness goes but she still struggles with stress and difficulty with emotional control.  She has been a night owl.  It is difficult for her to fall asleep at night, her best sleep seems to be after 6 AM.  She tries to go to bed between 10 and 11 currently but does not fall asleep quickly.  She uses her tablet and then likes to read.  She does not watch TV at night.  She has a rise time of around 10 AM.  She is not aware of any family history of OSA.  Her weight has been stable.  She is a non-smoker and quit drinking alcohol some 10 years ago.  She drinks caffeine in the form of Dr. Reino KentPepper, 1 to 2 cans/day on average. Her Epworth sleepiness score is 17 out of 24, fatigue severity score is 53 out of 63.  Her Past Medical History Is Significant For: Past Medical  History:  Diagnosis Date  . Anxiety disorder    with panic attacks.   . Arthritis   . Bipolar 1 disorder (HCC)   . Chronic fatigue   . Chronic pain    neck/back with spasms.   . Depression   . Fibromyalgia   . Frequent headaches   . Hypertension   . Thyroid disease    Hypothyroidism     Her Past Surgical History Is Significant For: Past Surgical History:  Procedure Laterality Date  . ANTERIOR CERVICAL DECOMP/DISCECTOMY FUSION    . BACK SURGERY    . KNEE SURGERY      Her Family History Is Significant For: Family History  Problem Relation Age of Onset  . Alcohol abuse Mother   . COPD Mother   . Depression Mother   . Drug abuse Mother   . Early death Mother   . Mental illness Mother   . Alcohol abuse Father   . Cancer Father        kidney  . Mental illness Father   . Mental illness Brother        schizophrenia    Her Social History Is Significant For: Social History   Socioeconomic History  . Marital status: Married    Spouse name: Onalee HuaDavid  . Number of children: 2  . Years of education: 4815  . Highest education level: Some college, no degree  Occupational History  . Occupation: disability  Social Needs  . Financial resource strain: Somewhat hard  . Food insecurity    Worry: Never true    Inability: Never true  . Transportation needs    Medical: No    Non-medical: No  Tobacco Use  . Smoking status: Never Smoker  . Smokeless tobacco: Never Used  Substance and Sexual Activity  . Alcohol use: No  . Drug use: No  . Sexual activity: Yes    Birth control/protection: Post-menopausal    Comment: Married since 2010  Lifestyle  . Physical activity    Days per week: 7 days    Minutes per session: 30 min  . Stress: Only a little  Relationships  . Social connections    Talks on phone: More than three times a week    Gets together: More than three times a week    Attends religious service: More than 4 times per year    Active member of club or organization:  No    Attends meetings of clubs or organizations: Never    Relationship status: Married  Other Topics Concern  . Not on file  Social History Narrative  . Not on file    Her Allergies Are:  Allergies  Allergen Reactions  . Silver Rash    Tegaderm Tegaderm Tegaderm Tegaderm Tegaderm Tegaderm   :   Her Current Medications Are:  Outpatient Encounter Medications as of 02/23/2019  Medication Sig  . ALPRAZolam (XANAX) 0.5 MG tablet Take 1 tablet (0.5 mg total) by mouth 3 (three) times daily as needed for anxiety.  . ALPRAZolam (XANAX) 0.5 MG tablet 1 tab(s)  . benazepril (LOTENSIN) 10 MG tablet Take 1 tablet (10 mg total) by mouth 2 (two) times daily.  . Brexpiprazole (REXULTI) 2 MG TABS Take 1 tablet by mouth daily.   . DULoxetine (CYMBALTA) 30 MG capsule Take 1 capsule (30 mg total) by mouth daily. With two 20 mg capsules  . levothyroxine (SYNTHROID) 50 MCG tablet Take 1 tablet (50 mcg total) by mouth daily before breakfast.  . metoprolol succinate (TOPROL-XL) 25 MG 24 hr tablet Take 1 tablet (25 mg total) by mouth daily.  . Multiple Vitamins-Minerals (MULTIVITAMIN WITH MINERALS) tablet Take 1 tablet by mouth daily.  Marland Kitchen. omega-3 acid ethyl esters (LOVAZA) 1 g capsule Take 2 mg by mouth daily.  . pantoprazole (PROTONIX) 40 MG tablet Take 1 tablet (40 mg total) by mouth at bedtime.  . ramelteon (ROZEREM) 8 MG tablet Take 1 tablet (8 mg total) by mouth at bedtime.  . senna-docusate (SENOKOT-S) 8.6-50 MG tablet Take 2 tablets by mouth at bedtime.  Marland Kitchen. tiZANidine (ZANAFLEX) 4 MG tablet Take 1 tablet (4 mg total) by mouth at bedtime.  Marland Kitchen. zolpidem (AMBIEN CR) 12.5 MG CR tablet Take 1 tablet (12.5 mg total) by mouth at bedtime as needed for sleep.   No facility-administered encounter medications on file as of 02/23/2019.   :  Review of Systems:  Out of a complete 14 point review of systems, all are reviewed and negative with the exception of these symptoms as listed below: Review of Systems   Neurological:       Pt presents today to discuss her sleep. Pt has never had a sleep study and does endorse occasional snoring.  Epworth Sleepiness Scale 0= would never doze 1= slight chance of dozing 2= moderate chance of dozing 3= high chance of dozing  Sitting and reading: 3 Watching TV: 3 Sitting inactive in a  public place (ex. Theater or meeting): 2 As a passenger in a car for an hour without a break: 3 Lying down to rest in the afternoon: 3 Sitting and talking to someone: 0 Sitting quietly after lunch (no alcohol): 3 In a car, while stopped in traffic: 0 Total: 17     Objective:  Neurological Exam  Physical Exam Physical Examination:   Vitals:   02/23/19 1534  BP: (!) 136/93  Pulse: (!) 112   General Examination: The patient is a very pleasant 53 y.o. female in no acute distress. She appears well-developed and well-nourished and well groomed.   HEENT: Normocephalic, atraumatic, pupils are equal, round and reactive to light and accommodation. Extraocular tracking is good without limitation to gaze excursion or nystagmus noted. Normal smooth pursuit is noted. Hearing is grossly intact. She may have very mild right facial weakness.  There is no dysarthria but she does have some hesitation in responding and some word finding difficulty at times, neck shows FROM. There are no carotid bruits on auscultation. Oropharynx exam reveals: mild mouth dryness, adequate dental hygiene and mild airway crowding, due to Small airway entry, tonsils are small, Mallampati class I.  Neck circumference is 15-1/8 inches. Tongue protrudes centrally and palate elevates symmetrically.    Chest: Clear to auscultation without wheezing, rhonchi or crackles noted.  Heart: S1+S2+0, regular and normal without murmurs, rubs or gallops noted.   Abdomen: Soft, non-tender and non-distended with normal bowel sounds appreciated on auscultation.  Extremities: There is no pitting edema in the distal lower  extremities bilaterally. Pedal pulses are intact.  Skin: Warm and dry without trophic changes noted.  Musculoskeletal: exam reveals no obvious joint deformities, tenderness or joint swelling or erythema.   Neurologically:  Mental status: The patient is awake, alert and oriented in all 4 spheres. Her immediate and remote memory, attention, language skills and fund of knowledge are appropriate, With the exception of mild word finding difficulty, no obvious dysarthria.  Cranial nerves II - XII are as described above under HEENT exam.  Motor exam: Normal bulk, strength and tone is noted, With the exception of slight weakness in the right grip strength and right hip flexor. There is no tremor or rebound. Romberg is negative. Fine motor skills and coordination: intact grossly.   Cerebellar testing: No dysmetria or intention tremor.  Sensory exam: intact to light touch.  Gait, station and balance: She stands easily. No veering to one side is noted. No leaning to one side is noted. Posture is age-appropriate and stance is narrow based. Gait shows normal stride length and normal pace. No problems turning are noted. Tandem walk is slightly challenging for her.               Assessment and Plan:  In summary, Manasvini Delker is a very pleasant 53 y.o.-year old female  with an underlying medical history of left parietal intraparenchymal hemorrhage in May 2020, hypertension, history of fibromyalgia, depression, chronic pain, anxiety, thyroid disease and overweight state, who presents for Evaluation of her sleep.  She has difficulty initiating sleep.  She does snore some.  She Has significant daytime somnolence.  Given her history of stroke, sleep apnea should be excluded and a sleep study is indicated. I had a long chat with the patient and her husband about my findings and the diagnosis of OSA, its prognosis and treatment options. We talked about medical treatments, surgical interventions and non-pharmacological  approaches. I explained in particular the risks and ramifications of untreated  moderate to severe OSA, especially with respect to developing cardiovascular disease down the Road, including congestive heart failure, difficult to treat hypertension, cardiac arrhythmias, or stroke. Even type 2 diabetes has, in part, been linked to untreated OSA. Symptoms of untreated OSA include daytime sleepiness, memory problems, mood irritability and mood disorder such as depression and anxiety, lack of energy, as well as recurrent headaches, especially morning headaches. We talked about trying to maintain a healthy lifestyle in general, as well as the importance of weight control.  I recommended the following at this time: sleep study.   I explained the sleep test procedure to the patient and also outlined possible surgical and non-surgical treatment options of OSA, including the use of CPAP. She indicated that she would be willing to try CPAP if the need arises. I explained the importance of being compliant with PAP treatment, not only for insurance purposes but primarily to improve Her symptoms, and for the patient's long term health benefit, including to reduce Her cardiovascular risks. I answered all their questions today and the patient and her husband were in agreement. I plan to see her back after the sleep study is completed and encouraged them to call with any interim questions, concerns, problems or updates.   Thank you very much for allowing me to participate in the care of this nice patient. If I can be of any further assistance to you please do not hesitate to talk to me.  Sincerely,   Star Age, MD, PhD

## 2019-02-23 NOTE — Patient Instructions (Signed)

## 2019-03-10 ENCOUNTER — Encounter: Payer: Self-pay | Admitting: Family Medicine

## 2019-03-18 ENCOUNTER — Ambulatory Visit: Payer: Medicare Other | Admitting: Family Medicine

## 2019-03-18 ENCOUNTER — Other Ambulatory Visit: Payer: Self-pay | Admitting: Family Medicine

## 2019-03-23 ENCOUNTER — Encounter: Payer: Self-pay | Admitting: Family Medicine

## 2019-03-23 ENCOUNTER — Ambulatory Visit (INDEPENDENT_AMBULATORY_CARE_PROVIDER_SITE_OTHER): Payer: Medicare Other | Admitting: Family Medicine

## 2019-03-23 DIAGNOSIS — F329 Major depressive disorder, single episode, unspecified: Secondary | ICD-10-CM | POA: Diagnosis not present

## 2019-03-23 DIAGNOSIS — F419 Anxiety disorder, unspecified: Secondary | ICD-10-CM

## 2019-03-23 DIAGNOSIS — G4700 Insomnia, unspecified: Secondary | ICD-10-CM | POA: Diagnosis not present

## 2019-03-23 DIAGNOSIS — F32A Depression, unspecified: Secondary | ICD-10-CM

## 2019-03-23 MED ORDER — OLANZAPINE 5 MG PO TABS: 5.0000 mg | ORAL_TABLET | Freq: Every day | ORAL | 2 refills | Status: DC

## 2019-03-23 MED ORDER — ALPRAZOLAM 0.5 MG PO TABS: 0.5000 mg | ORAL_TABLET | Freq: Three times a day (TID) | ORAL | 2 refills | Status: DC | PRN

## 2019-03-23 NOTE — Progress Notes (Signed)
Virtual Visit via telephone Note  I connected with Wendy Collier on 03/23/19 at 0900 by telephone and verified that I am speaking with the correct person using two identifiers. Wendy Collier is currently located at home and no other people are currently with her during visit. The provider, Elige Radon Andres Bantz, MD is located in their office at time of visit.  Call ended at 0919  I discussed the limitations, risks, security and privacy concerns of performing an evaluation and management service by telephone and the availability of in person appointments. I also discussed with the patient that there may be a patient responsible charge related to this service. The patient expressed understanding and agreed to proceed.   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 This is making her very anxious. She has been seeing psychiatry and is doing ok with depression but her mind is worrying and anxious and she has fear and is not sleeping at night. Patient is not sleeping at all.   No diagnosis found.  Outpatient Encounter Medications as of 03/23/2019  Medication Sig  . ALPRAZolam (XANAX) 0.5 MG tablet Take 1 tablet (0.5 mg total) by mouth 3 (three) times daily as needed for anxiety.  . ALPRAZolam (XANAX) 0.5 MG tablet 1 tab(s)  . benazepril (LOTENSIN) 10 MG tablet Take 1 tablet (10 mg total) by mouth 2 (two) times daily.  . Brexpiprazole (REXULTI) 2 MG TABS Take 1 tablet by mouth daily.   . DULoxetine (CYMBALTA) 30 MG capsule Take 1 capsule (30 mg total) by mouth daily. With two 20 mg capsules  . levothyroxine (SYNTHROID) 50 MCG tablet Take 1 tablet (50 mcg total) by mouth daily before breakfast.  . metoprolol succinate (TOPROL-XL) 25 MG 24 hr tablet Take 1 tablet  (25 mg total) by mouth daily.  . Multiple Vitamins-Minerals (MULTIVITAMIN WITH MINERALS) tablet Take 1 tablet by mouth daily.  Marland Kitchen omega-3 acid ethyl esters (LOVAZA) 1 g capsule Take 2 mg by mouth daily.  . pantoprazole (PROTONIX) 40 MG tablet Take 1 tablet (40 mg total) by mouth at bedtime.  . ramelteon (ROZEREM) 8 MG tablet Take 1 tablet (8 mg total) by mouth at bedtime.  . SENNA S 8.6-50 MG tablet TAKE 2 TABLETS BY MOUTH AT BEDTIME.  Marland Kitchen tiZANidine (ZANAFLEX) 4 MG tablet Take 1 tablet (4 mg total) by mouth at bedtime.  Marland Kitchen zolpidem (AMBIEN CR) 12.5 MG CR tablet Take 1 tablet (12.5 mg total) by mouth at bedtime as needed for sleep.   No facility-administered encounter medications on file as of 03/23/2019.     Review of Systems  Constitutional: Negative for chills and fever.  Eyes: Negative for visual disturbance.  Respiratory: Negative for chest tightness and shortness of breath.   Cardiovascular: Negative for chest pain and leg swelling.  Musculoskeletal: Negative for back pain and gait problem.  Skin: Negative for rash.  Neurological: Negative for dizziness, light-headedness and headaches.  Psychiatric/Behavioral: Positive for dysphoric mood and sleep disturbance. Negative for agitation, behavioral problems, self-injury and suicidal ideas. The patient is nervous/anxious.   All other systems reviewed and are negative.   Observations/Objective: Patient sounds comfortable and in no acute distress  Assessment and Plan: Problem List Items Addressed This Visit  Other   Anxiety and depression - Primary   Relevant Medications   OLANZapine (ZYPREXA) 5 MG tablet   ALPRAZolam (XANAX) 0.5 MG tablet    Other Visit Diagnoses    Insomnia, unspecified type       Relevant Medications   OLANZapine (ZYPREXA) 5 MG tablet   ALPRAZolam (XANAX) 0.5 MG tablet       Follow Up Instructions:  Follow up in 4 weeks   I discussed the assessment and treatment plan with the patient. The patient was  provided an opportunity to ask questions and all were answered. The patient agreed with the plan and demonstrated an understanding of the instructions.   The patient was advised to call back or seek an in-person evaluation if the symptoms worsen or if the condition fails to improve as anticipated.  The above assessment and management plan was discussed with the patient. The patient verbalized understanding of and has agreed to the management plan. Patient is aware to call the clinic if symptoms persist or worsen. Patient is aware when to return to the clinic for a follow-up visit. Patient educated on when it is appropriate to go to the emergency department.    I provided 19 minutes of non-face-to-face time during this encounter.    Worthy Rancher, MD

## 2019-03-24 ENCOUNTER — Encounter: Payer: Self-pay | Admitting: Family Medicine

## 2019-03-29 ENCOUNTER — Ambulatory Visit (INDEPENDENT_AMBULATORY_CARE_PROVIDER_SITE_OTHER): Payer: Medicare Other | Admitting: Neurology

## 2019-03-29 DIAGNOSIS — G4719 Other hypersomnia: Secondary | ICD-10-CM

## 2019-03-29 DIAGNOSIS — S06350D Traumatic hemorrhage of left cerebrum without loss of consciousness, subsequent encounter: Secondary | ICD-10-CM

## 2019-03-29 DIAGNOSIS — G4720 Circadian rhythm sleep disorder, unspecified type: Secondary | ICD-10-CM

## 2019-03-29 DIAGNOSIS — G478 Other sleep disorders: Secondary | ICD-10-CM

## 2019-03-29 DIAGNOSIS — E663 Overweight: Secondary | ICD-10-CM

## 2019-03-29 DIAGNOSIS — G4710 Hypersomnia, unspecified: Secondary | ICD-10-CM | POA: Diagnosis not present

## 2019-03-29 DIAGNOSIS — S06320D Contusion and laceration of left cerebrum without loss of consciousness, subsequent encounter: Secondary | ICD-10-CM

## 2019-03-29 DIAGNOSIS — G4761 Periodic limb movement disorder: Secondary | ICD-10-CM

## 2019-03-29 DIAGNOSIS — R0683 Snoring: Secondary | ICD-10-CM

## 2019-03-29 DIAGNOSIS — I10 Essential (primary) hypertension: Secondary | ICD-10-CM

## 2019-03-31 ENCOUNTER — Other Ambulatory Visit: Payer: Self-pay

## 2019-03-31 ENCOUNTER — Ambulatory Visit (INDEPENDENT_AMBULATORY_CARE_PROVIDER_SITE_OTHER): Payer: Medicare Other | Admitting: Physician Assistant

## 2019-03-31 DIAGNOSIS — R519 Headache, unspecified: Secondary | ICD-10-CM | POA: Diagnosis not present

## 2019-03-31 DIAGNOSIS — I10 Essential (primary) hypertension: Secondary | ICD-10-CM

## 2019-04-01 ENCOUNTER — Encounter: Payer: Self-pay | Admitting: Physician Assistant

## 2019-04-01 ENCOUNTER — Telehealth: Payer: Self-pay | Admitting: Family Medicine

## 2019-04-01 ENCOUNTER — Telehealth: Payer: Self-pay

## 2019-04-01 MED ORDER — HYDROCHLOROTHIAZIDE 25 MG PO TABS: 25.0000 mg | ORAL_TABLET | Freq: Every day | ORAL | 0 refills | Status: DC

## 2019-04-01 NOTE — Progress Notes (Signed)
Telephone visit  Subjective: CC: High blood pressure, headache PCP: Dettinger, Elige Radon, MD MEQ:ASTMHD Wendy Collier is a 53 y.o. female calls for telephone consult today. Patient provides verbal consent for consult held via phone.  Patient is identified with 2 separate identifiers.  At this time the entire area is on COVID-19 social distancing and stay home orders are in place.  Patient is of higher risk and therefore we are performing this by a virtual method.  Location of patient: Home Location of provider: [facility] Others present for call: None  This patient has had a headache today.  Earlier this year she experienced a headache and had a stroke.  I went back and reviewed her notes.  She had speech difficulties and the headache.  She did heal very well.  Today she slept late till about 10 AM when she got up her blood pressure was 151/103.  And a pulse of 88.  She took a muscle relaxer and the blood pressure came down to 125/85.  Just a little bit before this visit she was 136/98.  She states she had a little bit of trouble with her speech today but it seems to be better.  She is under a significant amount of stress.  They are currently moving.  Her blood pressures have been being higher for a while.  She states the lowest blood pressure she has seen this month was 125/79.  We have discussed using hydrochlorothiazide 1 daily to see if this can help lower her blood pressure a little bit without having a significant change.  She states she does have some generalized edema in her hands do get puffy.  I would like her to come back in 4 weeks to see Dr. Louanne Skye.   ROS: Per HPI  Allergies  Allergen Reactions  . Silver Rash    Tegaderm Tegaderm Tegaderm Tegaderm Tegaderm Tegaderm    Past Medical History:  Diagnosis Date  . Anxiety disorder    with panic attacks.   . Arthritis   . Bipolar 1 disorder (HCC)   . Chronic fatigue   . Chronic pain    neck/back with spasms.   . Depression    . Fibromyalgia   . Frequent headaches   . Hypertension   . Thyroid disease    Hypothyroidism     Current Outpatient Medications:  .  ALPRAZolam (XANAX) 0.5 MG tablet, Take 1 tablet (0.5 mg total) by mouth 3 (three) times daily as needed for anxiety., Disp: 90 tablet, Rfl: 2 .  benazepril (LOTENSIN) 10 MG tablet, Take 1 tablet (10 mg total) by mouth 2 (two) times daily., Disp: 180 tablet, Rfl: 3 .  DULoxetine (CYMBALTA) 30 MG capsule, Take 1 capsule (30 mg total) by mouth daily. With two 20 mg capsules, Disp: 90 capsule, Rfl: 3 .  hydrochlorothiazide (HYDRODIURIL) 25 MG tablet, Take 1 tablet (25 mg total) by mouth daily., Disp: 90 tablet, Rfl: 0 .  levothyroxine (SYNTHROID) 50 MCG tablet, Take 1 tablet (50 mcg total) by mouth daily before breakfast., Disp: 90 tablet, Rfl: 3 .  metoprolol succinate (TOPROL-XL) 25 MG 24 hr tablet, Take 1 tablet (25 mg total) by mouth daily., Disp: 90 tablet, Rfl: 3 .  Multiple Vitamins-Minerals (MULTIVITAMIN WITH MINERALS) tablet, Take 1 tablet by mouth daily., Disp: , Rfl:  .  OLANZapine (ZYPREXA) 5 MG tablet, Take 1 tablet (5 mg total) by mouth at bedtime., Disp: 30 tablet, Rfl: 2 .  omega-3 acid ethyl esters (LOVAZA)  1 g capsule, Take 2 mg by mouth daily., Disp: , Rfl:  .  pantoprazole (PROTONIX) 40 MG tablet, Take 1 tablet (40 mg total) by mouth at bedtime., Disp: 90 tablet, Rfl: 3 .  SENNA S 8.6-50 MG tablet, TAKE 2 TABLETS BY MOUTH AT BEDTIME., Disp: 60 tablet, Rfl: 0 .  tiZANidine (ZANAFLEX) 4 MG tablet, Take 1 tablet (4 mg total) by mouth at bedtime., Disp: 90 tablet, Rfl: 3 .  zolpidem (AMBIEN CR) 12.5 MG CR tablet, Take 1 tablet (12.5 mg total) by mouth at bedtime as needed for sleep., Disp: 30 tablet, Rfl: 1  Assessment/ Plan: 53 y.o. female   1. Essential hypertension - hydrochlorothiazide (HYDRODIURIL) 25 MG tablet; Take 1 tablet (25 mg total) by mouth daily.  Dispense: 90 tablet; Refill: 0  2. Nonintractable headache, unspecified chronicity  pattern, unspecified headache type Return to emergency department if any worsening headache or other neurological symptoms.   Return in about 4 weeks (around 04/28/2019) for follow with Dettinger please.  Continue all other maintenance medications as listed above.  Start time: 5:02 PM End time: 5:13 PM  Meds ordered this encounter  Medications  . hydrochlorothiazide (HYDRODIURIL) 25 MG tablet    Sig: Take 1 tablet (25 mg total) by mouth daily.    Dispense:  90 tablet    Refill:  0    Order Specific Question:   Supervising Provider    Answer:   Janora Norlander [[id number]]    Particia Nearing PA-C [place] [phone]

## 2019-04-01 NOTE — Telephone Encounter (Signed)
Patient complains of headache and elevated blood pressure since yesterday.  Patient has history of stroke.  Patient wanted to make appointment today with Dr. Warrick Parisian but we do not have availability.  I advised patient to go to ER for evaluation based on her history.

## 2019-04-02 ENCOUNTER — Encounter: Payer: Self-pay | Admitting: Family Medicine

## 2019-04-02 ENCOUNTER — Ambulatory Visit (INDEPENDENT_AMBULATORY_CARE_PROVIDER_SITE_OTHER): Payer: Medicare Other | Admitting: Family Medicine

## 2019-04-02 DIAGNOSIS — I10 Essential (primary) hypertension: Secondary | ICD-10-CM

## 2019-04-02 MED ORDER — BENAZEPRIL HCL 10 MG PO TABS: 30.0000 mg | ORAL_TABLET | Freq: Every day | ORAL | 3 refills | Status: DC

## 2019-04-02 MED ORDER — HYDROCHLOROTHIAZIDE 25 MG PO TABS: 25.0000 mg | ORAL_TABLET | Freq: Every day | ORAL | 3 refills | Status: DC

## 2019-04-02 MED ORDER — TIZANIDINE HCL 4 MG PO TABS: 4.0000 mg | ORAL_TABLET | Freq: Three times a day (TID) | ORAL | 3 refills | Status: DC

## 2019-04-02 NOTE — Progress Notes (Signed)
Virtual Visit via telephone Note  I connected with [name] on 04/02/19 at 0928 by telephone and verified that I am speaking with the correct person using two identifiers. Wendy Collier is currently located at home and no other people are currently with her during visit. The provider, Fransisca Kaufmann Daanya Lanphier, MD is located in their office at time of visit.  Call ended at [phone]  I discussed the limitations, risks, security and privacy concerns of performing an evaluation and management service by telephone and the availability of in person appointments. I also discussed with the patient that there may be a patient responsible charge related to this service. The patient expressed understanding and agreed to proceed.  149/102, bp 134/90 History and Present Illness: Patient has been having elevated bp over the past month and she had a confusion and difficulty speaking like when she had a stroke and her bp was 151/104. Patient was started on a diuretic but never received because of missed communication with pharmacy. Patient took 40 mg benazepril yesterday and her bp has been elevated.  She has been having headaches and other symptoms although not today that were similar to what she was having previously when she did have her stroke.  She chose not to go to the emergency department, we discussed this and recommended that if she starts having the symptoms again to go into the emergency department.  No diagnosis found.  Outpatient Encounter Medications as of 04/02/2019  Medication Sig  . ALPRAZolam (XANAX) 0.5 MG tablet Take 1 tablet (0.5 mg total) by mouth 3 (three) times daily as needed for anxiety.  . benazepril (LOTENSIN) 10 MG tablet Take 1 tablet (10 mg total) by mouth 2 (two) times daily.  . DULoxetine (CYMBALTA) 30 MG capsule Take 1 capsule (30 mg total) by mouth daily. With two 20 mg capsules  . hydrochlorothiazide (HYDRODIURIL) 25 MG tablet Take 1 tablet (25 mg total) by mouth daily.  Marland Kitchen  levothyroxine (SYNTHROID) 50 MCG tablet Take 1 tablet (50 mcg total) by mouth daily before breakfast.  . metoprolol succinate (TOPROL-XL) 25 MG 24 hr tablet Take 1 tablet (25 mg total) by mouth daily.  . Multiple Vitamins-Minerals (MULTIVITAMIN WITH MINERALS) tablet Take 1 tablet by mouth daily.  Marland Kitchen OLANZapine (ZYPREXA) 5 MG tablet Take 1 tablet (5 mg total) by mouth at bedtime.  Marland Kitchen omega-3 acid ethyl esters (LOVAZA) 1 g capsule Take 2 mg by mouth daily.  . pantoprazole (PROTONIX) 40 MG tablet Take 1 tablet (40 mg total) by mouth at bedtime.  . SENNA S 8.6-50 MG tablet TAKE 2 TABLETS BY MOUTH AT BEDTIME.  Marland Kitchen tiZANidine (ZANAFLEX) 4 MG tablet Take 1 tablet (4 mg total) by mouth at bedtime.  Marland Kitchen zolpidem (AMBIEN CR) 12.5 MG CR tablet Take 1 tablet (12.5 mg total) by mouth at bedtime as needed for sleep.   No facility-administered encounter medications on file as of 04/02/2019.     Review of Systems  Constitutional: Negative for chills and fever.  Eyes: Negative for visual disturbance.  Respiratory: Negative for chest tightness and shortness of breath.   Cardiovascular: Negative for chest pain and leg swelling.  Musculoskeletal: Negative for back pain and gait problem.  Skin: Negative for rash.  Neurological: Positive for numbness and headaches. Negative for dizziness, speech difficulty, weakness and light-headedness.  Psychiatric/Behavioral: Negative for agitation and behavioral problems.  All other systems reviewed and are negative.   Observations/Objective: Patient sounds comfortable and in no acute distress  Assessment and Plan:  Problem List Items Addressed This Visit      Cardiovascular and Mediastinum   Essential hypertension   Relevant Medications   hydrochlorothiazide (HYDRODIURIL) 25 MG tablet   benazepril (LOTENSIN) 10 MG tablet       Follow Up Instructions: Follow up in 4 weeks for htn recheck Increase benazepril  And start hctz.    I discussed the assessment and  treatment plan with the patient. The patient was provided an opportunity to ask questions and all were answered. The patient agreed with the plan and demonstrated an understanding of the instructions.   The patient was advised to call back or seek an in-person evaluation if the symptoms worsen or if the condition fails to improve as anticipated.  The above assessment and management plan was discussed with the patient. The patient verbalized understanding of and has agreed to the management plan. Patient is aware to call the clinic if symptoms persist or worsen. Patient is aware when to return to the clinic for a follow-up visit. Patient educated on when it is appropriate to go to the emergency department.    I provided 15 minutes of non-face-to-face time during this encounter.    Nils Pyle, MD

## 2019-04-02 NOTE — Progress Notes (Signed)
Apt scheduled.  

## 2019-04-03 ENCOUNTER — Other Ambulatory Visit: Payer: Self-pay | Admitting: Family Medicine

## 2019-04-08 ENCOUNTER — Telehealth: Payer: Self-pay

## 2019-04-08 NOTE — Procedures (Signed)
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 The patient endorsed the Epworth Sleepiness Scale at 17 points. The patient's weight 172 pounds with a height of 66 (inches), resulting in a BMI of 27.6 kg/m2. The patient's neck circumference measured 15 1/8 inches.  CURRENT MEDICATIONS: Xanax, Lotensin, Rexulti, Cymbalta, Synthroid, Toprol, Lovaza, Protonix, Rozerem, Senokot, Zanaflex, Ambien   PROCEDURE:  This is a multichannel digital polysomnogram utilizing the Somnostar 11.2 system.  Electrodes and sensors were applied and monitored per AASM Specifications.   EEG, EOG, Chin and Limb EMG, were sampled at 200 Hz.  ECG, Snore and Nasal Pressure, Thermal Airflow, Respiratory Effort, CPAP Flow and Pressure, Oximetry was sampled at 50 Hz. Digital video and audio were recorded.      BASELINE STUDY  Lights Out was at 22:52 and Lights On at 05:00.  Total recording time (TRT) was 368.5 minutes, with a total sleep time (TST) of 187.5 minutes.   The patient's sleep latency was 154.5 minutes, which is markedly delayed. REM sleep was absent. The sleep efficiency was 50.9 %, which is markedly reduced.     SLEEP ARCHITECTURE: WASO (Wake after sleep onset) was 46.5 minutes with mild sleep fragmentation noted and inability to go back to sleep after 04:38. There were 24.5 minutes in Stage N1, 118 minutes Stage N2, 45 minutes Stage N3 and 0 minutes in Stage REM.  The percentage of Stage N1 was 13.1%, which is increased, Stage N2 was 62.9%, which is increased, Stage N3 was 24.% and Stage R (REM  sleep) was absent. The arousals were noted as: 26 were spontaneous, 14 were associated with PLMs, 0 were associated with respiratory events.  RESPIRATORY ANALYSIS:  There were a total of 0 respiratory events:  0 obstructive apneas, 0 central apneas and 0 mixed apneas with a total of 0 apneas and an apnea index (AI) of 0 /hour. There were 0 hypopneas with a hypopnea index of 0 /hour. The patient also had 0 respiratory event related arousals (RERAs).      The total APNEA/HYPOPNEA INDEX (AHI) was 0 /hour and the total RESPIRATORY DISTURBANCE INDEX was 0 /hour.  0 events occurred in REM sleep and 0 events in NREM. The REM AHI was 0 /hour, versus a non-REM AHI of 0. The patient spent 42 minutes of total sleep time in the supine position and 146 minutes in non-supine.. The supine AHI was 0.0 versus a non-supine AHI of 0.0.  OXYGEN SATURATION & C02:  The Wake baseline 02 saturation was 98%, with the lowest being 85%. Time spent below 89% saturation equaled 2 minutes. PERIODIC LIMB MOVEMENTS: The patient had a total of 52 Periodic Limb Movements.  The Periodic Limb Movement (PLM) index was 16.6 and the PLM Arousal index was 4.5/hour.  Audio and video analysis did not show any abnormal or unusual movements, behaviors, phonations or vocalizations. The patient took no bathroom breaks. Mild snoring was noted. The EKG was in keeping with normal sinus rhythm (NSR).  Post-study, the patient indicated that sleep was the same as usual.   IMPRESSION:  1. Poor sleep  pattern 2. Primary Snoring 3. PLMD (periodic limb movement disorder) 4. Dysfunctions associated with sleep stages or arousal from sleep  RECOMMENDATIONS:  1. This study does not demonstrate any significant obstructive or central sleep disordered breathing; however, the study was limited due to poor sleep consolidation and total sleep time of just above 3 hours and absence of REM sleep. This may underestimate her sleep disordered breathing. Therefore,  I would like to suggest a home sleep test for a recheck. The patient will be advised to this.  2. Mild PLMs (periodic limb movements of sleep) were noted during this study with minimal arousals; clinical correlation is recommended. Medication effect from the antidepressant medication should be considered.  3. This study shows sleep fragmentation and abnormal sleep stage percentages; these are nonspecific findings and per se do not signify an intrinsic sleep disorder or a cause for the patient's sleep-related symptoms. Causes include (but are not limited to) the first night effect of the sleep study, circadian rhythm disturbances, medication effect or an underlying mood disorder or medical problem.  4. The patient should be cautioned not to drive, work at heights, or operate dangerous or heavy equipment when tired or sleepy. Review and reiteration of good sleep hygiene measures should be pursued with any patient. 5. The patient will be advised of the test results and next recommendation.  I certify that I have reviewed the entire raw data recording prior to the issuance of this report in accordance with the Standards of Accreditation of the American Academy of Sleep Medicine (AASM)   Huston Foley, MD, PhD Diplomat, American Board of Neurology and Sleep Medicine (Neurology and Sleep Medicine)

## 2019-04-08 NOTE — Telephone Encounter (Signed)
-----   Message from Star Age, MD sent at 04/08/2019  8:28 AM EDT ----- Patient referred by Claris Gower, NP, seen by me on 02/23/19, diagnostic PSG on 03/29/19.   Please call and notify the patient that the recent sleep study did not show any significant obstructive sleep apnea; however, the study was limited due to poor sleep consolidation and total sleep time of just above 3 hours and absence of REM sleep. This may underestimate her sleep disordered breathing. Therefore, I would like to suggest a home sleep test for a recheck. I will order HST, if she is okay, so we can check with insurance. Let me know if patient declines the HST.   Thanks,  Star Age, MD, PhD [facility]

## 2019-04-08 NOTE — Progress Notes (Signed)
Patient referred by Claris Gower, NP, seen by me on 02/23/19, diagnostic PSG on 03/29/19.   Please call and notify the patient that the recent sleep study did not show any significant obstructive sleep apnea; however, the study was limited due to poor sleep consolidation and total sleep time of just above 3 hours and absence of REM sleep. This may underestimate her sleep disordered breathing. Therefore, I would like to suggest a home sleep test for a recheck. I will order HST, if she is okay, so we can check with insurance. Let me know if patient declines the HST.   Thanks,  Star Age, MD, PhD [facility])

## 2019-04-08 NOTE — Addendum Note (Signed)
Addended by: Star Age on: 04/08/2019 08:28 AM   Modules accepted: Orders

## 2019-04-08 NOTE — Telephone Encounter (Signed)
I called pt, no answer, her phone just keeps ringing, it does not go to VM. Will send a mychart message asking her to call me back.

## 2019-04-09 NOTE — Telephone Encounter (Signed)
I called pt again but the phone just keeps ringing and does not go to VM.

## 2019-04-13 NOTE — Telephone Encounter (Signed)
Pt returned my call. I discussed her sleep study results and recommendations. Pt is agreeable to doing an HST. I updated her phone numbers. Pt verbalized understanding of results. Pt had no questions at this time but was encouraged to call back if questions arise.

## 2019-04-15 ENCOUNTER — Other Ambulatory Visit: Payer: Self-pay

## 2019-04-15 ENCOUNTER — Telehealth: Payer: Self-pay

## 2019-04-15 ENCOUNTER — Encounter: Payer: Self-pay | Admitting: Adult Health

## 2019-04-15 ENCOUNTER — Ambulatory Visit: Payer: Medicare Other | Admitting: Adult Health

## 2019-04-15 NOTE — Telephone Encounter (Signed)
Patient was a no call/no show for their appointment today.   

## 2019-04-16 ENCOUNTER — Ambulatory Visit (INDEPENDENT_AMBULATORY_CARE_PROVIDER_SITE_OTHER): Payer: Medicare Other | Admitting: Family Medicine

## 2019-04-16 ENCOUNTER — Encounter: Payer: Self-pay | Admitting: Family Medicine

## 2019-04-16 VITALS — BP 106/69 | HR 89 | Temp 98.2°F | Ht 66.0 in | Wt 182.0 lb

## 2019-04-16 DIAGNOSIS — F419 Anxiety disorder, unspecified: Secondary | ICD-10-CM | POA: Diagnosis not present

## 2019-04-16 DIAGNOSIS — I10 Essential (primary) hypertension: Secondary | ICD-10-CM | POA: Diagnosis not present

## 2019-04-16 DIAGNOSIS — E039 Hypothyroidism, unspecified: Secondary | ICD-10-CM

## 2019-04-16 DIAGNOSIS — F329 Major depressive disorder, single episode, unspecified: Secondary | ICD-10-CM

## 2019-04-16 DIAGNOSIS — Z23 Encounter for immunization: Secondary | ICD-10-CM

## 2019-04-16 DIAGNOSIS — G4700 Insomnia, unspecified: Secondary | ICD-10-CM

## 2019-04-16 DIAGNOSIS — F32A Depression, unspecified: Secondary | ICD-10-CM

## 2019-04-16 DIAGNOSIS — I671 Cerebral aneurysm, nonruptured: Secondary | ICD-10-CM

## 2019-04-16 MED ORDER — OLANZAPINE 5 MG PO TABS: 5.0000 mg | ORAL_TABLET | Freq: Every day | ORAL | 2 refills | Status: DC

## 2019-04-16 MED ORDER — ZOLPIDEM TARTRATE ER 12.5 MG PO TBCR: 12.5000 mg | EXTENDED_RELEASE_TABLET | Freq: Every evening | ORAL | 2 refills | Status: DC | PRN

## 2019-04-16 NOTE — Progress Notes (Signed)
BP 106/69   Pulse 89   Temp 98.2 F (36.8 C) (Temporal)   Ht '5\' 6"'  (1.676 m)   Wt 182 lb (82.6 kg)   LMP 08/28/2016 (Approximate) Comment: Pt reports being unknown when she had her last period, but had spotting two days ago.   SpO2 98%   BMI 29.38 kg/m    Subjective:   Patient ID: Wendy Collier, female    DOB: [birth year], 53 y.o.   MRN: [id number]  HPI: Wendy Collier is a 53 y.o. female presenting on 04/16/2019 for Stroke follow up and Hypertension   HPI Hypertension Patient is currently on benazepril 10 3 times daily and hydrochlorothiazide 25 and metoprolol 25, and their blood pressure today is 106/69 but she says her blood pressure runs up at night and she started to wake herself up to tested at night to see how it is running because she is concerned about her aneurysm that they are monitoring. Patient denies any lightheadedness or dizziness. Patient denies headaches, blurred vision, chest pains, shortness of breath, or weakness. Denies any side effects from medication and is content with current medication.   Hypothyroidism recheck Patient is coming in for thyroid recheck today as well. They deny any issues with hair changes or heat or cold problems or diarrhea or constipation. They deny any chest pain or palpitations. They are currently on levothyroxine 44mcrograms   Patient is still having issues with insomnia, she is not taking the olanzapine that we prescribed, she is taking the Ambien but is still not fully working she gets about 3 to 5 hours a night and then she sleeps in the morning time.  She did go for sleep study but because she did not sleep enough the could not interpret and they will do a home sleep study and follow-up with that.  Relevant past medical, surgical, family and social history reviewed and updated as indicated. Interim medical history since our last visit reviewed. Allergies and medications reviewed and updated.  Review of Systems  Constitutional:  Negative for chills and fever.  Eyes: Negative for visual disturbance.  Respiratory: Negative for chest tightness and shortness of breath.   Cardiovascular: Negative for chest pain and leg swelling.  Musculoskeletal: Negative for back pain and gait problem.  Skin: Negative for rash.  Neurological: Negative for light-headedness and headaches.  Psychiatric/Behavioral: Positive for sleep disturbance. Negative for agitation, behavioral problems, dysphoric mood, self-injury and suicidal ideas. The patient is nervous/anxious.   All other systems reviewed and are negative.   Per HPI unless specifically indicated above   Allergies as of 04/16/2019      Reactions   Silver Rash   Tegaderm Tegaderm Tegaderm Tegaderm Tegaderm Tegaderm      Medication List       Accurate as of April 16, 2019  9:59 AM. If you have any questions, ask your nurse or doctor.        STOP taking these medications   omega-3 acid ethyl esters 1 g capsule Commonly known as: LOVAZA Stopped by: JFransisca KaufmannDettinger, MD     TAKE these medications   ALPRAZolam 0.5 MG tablet Commonly known as: XANAX Take 1 tablet (0.5 mg total) by mouth 3 (three) times daily as needed for anxiety.   benazepril 10 MG tablet Commonly known as: LOTENSIN Take 3 tablets (30 mg total) by mouth daily.   DULoxetine 30 MG capsule Commonly known as: CYMBALTA Take 1 capsule (30 mg total) by mouth daily. With two 20 mg capsules  What changed: additional instructions   hydrochlorothiazide 25 MG tablet Commonly known as: HYDRODIURIL Take 1 tablet (25 mg total) by mouth daily.   levothyroxine 50 MCG tablet Commonly known as: SYNTHROID Take 1 tablet (50 mcg total) by mouth daily before breakfast.   metoprolol succinate 25 MG 24 hr tablet Commonly known as: TOPROL-XL Take 1 tablet (25 mg total) by mouth daily.   mirtazapine 30 MG tablet Commonly known as: REMERON   multivitamin with minerals tablet Take 1 tablet by mouth  daily.   OLANZapine 5 MG tablet Commonly known as: ZyPREXA Take 1 tablet (5 mg total) by mouth at bedtime.   pantoprazole 40 MG tablet Commonly known as: PROTONIX Take 1 tablet (40 mg total) by mouth at bedtime.   rosuvastatin 10 MG tablet Commonly known as: CRESTOR   Senna S 8.6-50 MG tablet Generic drug: senna-docusate TAKE 2 TABLETS BY MOUTH AT BEDTIME.   tiZANidine 4 MG tablet Commonly known as: ZANAFLEX Take 1 tablet (4 mg total) by mouth 3 (three) times daily.   zolpidem 12.5 MG CR tablet Commonly known as: Ambien CR Take 1 tablet (12.5 mg total) by mouth at bedtime as needed for sleep.        Objective:   BP 106/69   Pulse 89   Temp 98.2 F (36.8 C) (Temporal)   Ht '5\' 6"'  (1.676 m)   Wt 182 lb (82.6 kg)   LMP 08/28/2016 (Approximate) Comment: Pt reports being unknown when she had her last period, but had spotting two days ago.   SpO2 98%   BMI 29.38 kg/m   Wt Readings from Last 3 Encounters:  04/16/19 182 lb (82.6 kg)  02/23/19 172 lb (78 kg)  01/20/19 173 lb (78.5 kg)    Physical Exam Vitals signs and nursing note reviewed.  Constitutional:      General: She is not in acute distress.    Appearance: She is well-developed. She is not diaphoretic.  Eyes:     Conjunctiva/sclera: Conjunctivae normal.  Cardiovascular:     Rate and Rhythm: Normal rate and regular rhythm.     Heart sounds: Normal heart sounds. No murmur.  Pulmonary:     Effort: Pulmonary effort is normal. No respiratory distress.     Breath sounds: Normal breath sounds. No wheezing.  Musculoskeletal: Normal range of motion.        General: No tenderness.  Skin:    General: Skin is warm and dry.     Findings: No rash.  Neurological:     Mental Status: She is alert and oriented to person, place, and time.     Coordination: Coordination normal.  Psychiatric:        Behavior: Behavior normal.       Assessment & Plan:   Problem List Items Addressed This Visit      Cardiovascular  and Mediastinum   Essential hypertension - Primary   Relevant Medications   rosuvastatin (CRESTOR) 10 MG tablet   Other Relevant Orders   CMP14+EGFR   Lipid panel   Cerebral aneurysm   Relevant Medications   rosuvastatin (CRESTOR) 10 MG tablet     Endocrine   Hypothyroidism   Relevant Orders   Lipid panel   TSH     Other   Anxiety and depression   Relevant Medications   mirtazapine (REMERON) 30 MG tablet   zolpidem (AMBIEN CR) 12.5 MG CR tablet   OLANZapine (ZYPREXA) 5 MG tablet    Other Visit Diagnoses  Insomnia, unspecified type       Relevant Medications   zolpidem (AMBIEN CR) 12.5 MG CR tablet   OLANZapine (ZYPREXA) 5 MG tablet   Need for immunization against influenza       Relevant Orders   Flu Vaccine QUAD 36+ mos IM (Completed)      Will continue Ambien and mirtazapine and start back up on the olanzapine which she never ended up taking. Follow up plan: Return in about 3 months (around 07/17/2019), or if symptoms worsen or fail to improve, for Hypertension recheck.  Counseling provided for all of the vaccine components No orders of the defined types were placed in this encounter.   Caryl Pina, MD [facility] 04/16/2019, 9:59 AM

## 2019-04-17 LAB — CMP14+EGFR
ALT: 31 IU/L (ref 0–32)
AST: 38 IU/L (ref 0–40)
Albumin/Globulin Ratio: 1.9 (ref 1.2–2.2)
Albumin: 4.5 g/dL (ref 3.8–4.9)
Alkaline Phosphatase: 118 IU/L — ABNORMAL HIGH (ref 39–117)
BUN/Creatinine Ratio: 20 (ref 9–23)
BUN: 18 mg/dL (ref 6–24)
Bilirubin Total: 0.2 mg/dL (ref 0.0–1.2)
CO2: 21 mmol/L (ref 20–29)
Calcium: 9.8 mg/dL (ref 8.7–10.2)
Chloride: 103 mmol/L (ref 96–106)
Creatinine, Ser: 0.91 mg/dL (ref 0.57–1.00)
GFR calc Af Amer: 84 mL/min/{1.73_m2} (ref >59)
GFR calc non Af Amer: 73 mL/min/{1.73_m2} (ref >59)
Globulin, Total: 2.4 g/dL (ref 1.5–4.5)
Glucose: 114 mg/dL — ABNORMAL HIGH (ref 65–99)
Potassium: 4 mmol/L (ref 3.5–5.2)
Sodium: 140 mmol/L (ref 134–144)
Total Protein: 6.9 g/dL (ref 6.0–8.5)

## 2019-04-17 LAB — LIPID PANEL
Chol/HDL Ratio: 3.6 ratio (ref 0.0–4.4)
Cholesterol, Total: 118 mg/dL (ref 100–199)
HDL: 33 mg/dL — ABNORMAL LOW (ref >39)
LDL Chol Calc (NIH): 37 mg/dL (ref 0–99)
Triglycerides: 321 mg/dL — ABNORMAL HIGH (ref 0–149)
VLDL Cholesterol Cal: 48 mg/dL — ABNORMAL HIGH (ref 5–40)

## 2019-04-17 LAB — TSH: TSH: 6.19 u[IU]/mL — ABNORMAL HIGH (ref 0.450–4.500)

## 2019-04-21 ENCOUNTER — Telehealth: Payer: Self-pay

## 2019-04-21 NOTE — Telephone Encounter (Signed)
We have attempted to call the patient two times to schedule sleep study.  Patient has been unavailable at the phone numbers we have on file and has not returned our calls. If patient calls back we will schedule them for their sleep study.  

## 2019-04-25 ENCOUNTER — Encounter: Payer: Self-pay | Admitting: Family Medicine

## 2019-05-04 ENCOUNTER — Encounter: Payer: Self-pay | Admitting: Family Medicine

## 2019-05-04 ENCOUNTER — Telehealth: Payer: Self-pay | Admitting: Family Medicine

## 2019-05-04 MED ORDER — LEVOTHYROXINE SODIUM 75 MCG PO TABS: 75.0000 ug | ORAL_TABLET | Freq: Every day | ORAL | 1 refills | Status: DC

## 2019-05-04 NOTE — Telephone Encounter (Signed)
I sent in the levothyroxine for her the alprazolam prescription that I sent in on September 28 was for 90 tablets with 2 refills so I do not know why she only got 30, it sounds like a problem at the pharmacy not on our end, I think she needs asked the pharmacy why they did not fill the prescription that I sent appropriately, she clearly got 90 last month so I do not know why she did not get it this month Caryl Pina, MD [facility] 05/04/2019, 8:39 PM

## 2019-05-05 NOTE — Telephone Encounter (Signed)
Pharm - [facility] to be called at 900 am when they open

## 2019-05-05 NOTE — Telephone Encounter (Signed)
Will call at 900 am when they open

## 2019-05-08 ENCOUNTER — Telehealth: Payer: Self-pay | Admitting: Family Medicine

## 2019-05-12 NOTE — Telephone Encounter (Signed)
No call back - this encounter will be closed.  

## 2019-05-20 ENCOUNTER — Encounter: Payer: Self-pay | Admitting: Family Medicine

## 2019-05-24 ENCOUNTER — Encounter: Payer: Self-pay | Admitting: Family Medicine

## 2019-06-20 ENCOUNTER — Encounter: Payer: Self-pay | Admitting: Family Medicine

## 2019-06-20 DIAGNOSIS — G4700 Insomnia, unspecified: Secondary | ICD-10-CM

## 2019-06-20 DIAGNOSIS — F32A Depression, unspecified: Secondary | ICD-10-CM

## 2019-06-20 DIAGNOSIS — F329 Major depressive disorder, single episode, unspecified: Secondary | ICD-10-CM

## 2019-06-30 MED ORDER — OLANZAPINE 7.5 MG PO TABS: 7.5000 mg | ORAL_TABLET | Freq: Every day | ORAL | 1 refills | Status: DC

## 2019-07-22 ENCOUNTER — Ambulatory Visit (INDEPENDENT_AMBULATORY_CARE_PROVIDER_SITE_OTHER): Payer: Medicare Other | Admitting: Family Medicine

## 2019-07-22 ENCOUNTER — Encounter: Payer: Self-pay | Admitting: Family Medicine

## 2019-07-22 DIAGNOSIS — F419 Anxiety disorder, unspecified: Secondary | ICD-10-CM | POA: Diagnosis not present

## 2019-07-22 DIAGNOSIS — F32A Depression, unspecified: Secondary | ICD-10-CM

## 2019-07-22 DIAGNOSIS — F329 Major depressive disorder, single episode, unspecified: Secondary | ICD-10-CM

## 2019-07-22 DIAGNOSIS — E039 Hypothyroidism, unspecified: Secondary | ICD-10-CM

## 2019-07-22 DIAGNOSIS — I10 Essential (primary) hypertension: Secondary | ICD-10-CM | POA: Diagnosis not present

## 2019-07-22 MED ORDER — DULOXETINE HCL 30 MG PO CPEP: 60.0000 mg | ORAL_CAPSULE | Freq: Every day | ORAL | 3 refills | Status: DC

## 2019-07-22 NOTE — Progress Notes (Signed)
Virtual Visit via telephone Note  I connected with Wendy Collier on 07/22/19 at 1400 by telephone and verified that I am speaking with the correct person using two identifiers. Wendy Collier is currently located at home and no other people are currently with her during visit. The provider, Wendy Kaufmann Jasmeet Manton, MD is located in their office at time of visit.  Call ended at 1415  I discussed the limitations, risks, security and privacy concerns of performing an evaluation and management service by telephone and the availability of in person appointments. I also discussed with the patient that there may be a patient responsible charge related to this service. The patient expressed understanding and agreed to proceed.   History and Present Illness: Hypothyroidism recheck Patient is coming in for thyroid recheck today as well. They deny any issues with hair changes or heat or cold problems or diarrhea or constipation. They deny any chest pain or palpitations. They are currently on levothyroxine 52mcrograms   Insomnia and anxiety Her anxiety and depression increased in the winter and would like to increase the duloxetine. She denies any suicidal ideations. She is on zyprexa and xanax.    Hypertension Patient is currently on benazepril and hctz and metoprolol, and their blood pressure today is 130/75. Patient denies any lightheadedness or dizziness. Patient denies headaches, blurred vision, chest pains, shortness of breath, or weakness. Denies any side effects from medication and is content with current medication.    No diagnosis found.  Outpatient Encounter Medications as of 07/22/2019  Medication Sig  . ALPRAZolam (XANAX) 0.5 MG tablet Take 1 tablet (0.5 mg total) by mouth 3 (three) times daily as needed for anxiety.  . benazepril (LOTENSIN) 10 MG tablet Take 3 tablets (30 mg total) by mouth daily.  . DULoxetine (CYMBALTA) 30 MG capsule Take 1 capsule (30 mg total) by mouth daily. With two 20  mg capsules (Patient taking differently: Take 30 mg by mouth daily. )  . hydrochlorothiazide (HYDRODIURIL) 25 MG tablet Take 1 tablet (25 mg total) by mouth daily.  .Marland Kitchenlevothyroxine (SYNTHROID) 75 MCG tablet Take 1 tablet (75 mcg total) by mouth daily before breakfast.  . metoprolol succinate (TOPROL-XL) 25 MG 24 hr tablet Take 1 tablet (25 mg total) by mouth daily.  . mirtazapine (REMERON) 30 MG tablet   . Multiple Vitamins-Minerals (MULTIVITAMIN WITH MINERALS) tablet Take 1 tablet by mouth daily.  .Marland KitchenOLANZapine (ZYPREXA) 7.5 MG tablet Take 1 tablet (7.5 mg total) by mouth at bedtime.  . pantoprazole (PROTONIX) 40 MG tablet Take 1 tablet (40 mg total) by mouth at bedtime. (Patient not taking: Reported on 04/16/2019)  . rosuvastatin (CRESTOR) 10 MG tablet   . SENNA S 8.6-50 MG tablet TAKE 2 TABLETS BY MOUTH AT BEDTIME.  .Marland KitchentiZANidine (ZANAFLEX) 4 MG tablet Take 1 tablet (4 mg total) by mouth 3 (three) times daily.  .Marland Kitchenzolpidem (AMBIEN CR) 12.5 MG CR tablet Take 1 tablet (12.5 mg total) by mouth at bedtime as needed for sleep.   No facility-administered encounter medications on file as of 07/22/2019.    Review of Systems  Constitutional: Negative for chills and fever.  HENT: Negative for ear discharge.   Eyes: Negative for visual disturbance.  Respiratory: Negative for chest tightness and shortness of breath.   Cardiovascular: Negative for chest pain and leg swelling.  Musculoskeletal: Negative for back pain and gait problem.  Skin: Negative for rash.  Neurological: Negative for dizziness, light-headedness and headaches.  Psychiatric/Behavioral: Negative for agitation and behavioral  problems.  All other systems reviewed and are negative.   Observations/Objective: Patient sounds comfortable and in no acute distress  Assessment and Plan: Problem List Items Addressed This Visit      Cardiovascular and Mediastinum   Essential hypertension   Relevant Orders   CMP14+EGFR   Lipid panel      Endocrine   Hypothyroidism - Primary   Relevant Orders   TSH   Lipid panel     Other   Anxiety and depression   Relevant Medications   DULoxetine (CYMBALTA) 30 MG capsule    Increased Cymbalta to 60 mg, patient will take this and see if it gives her energy, she had been on Adderall in the past and wants to discuss this but I said lets try these other things first and see from there.  Am concerned that the Adderall may affect her blood pressure  Follow up plan: Return in about 3 months (around 10/20/2019), or if symptoms worsen or fail to improve, for htn and thyroid and anxiety depression.     I discussed the assessment and treatment plan with the patient. The patient was provided an opportunity to ask questions and all were answered. The patient agreed with the plan and demonstrated an understanding of the instructions.   The patient was advised to call back or seek an in-person evaluation if the symptoms worsen or if the condition fails to improve as anticipated.  The above assessment and management plan was discussed with the patient. The patient verbalized understanding of and has agreed to the management plan. Patient is aware to call the clinic if symptoms persist or worsen. Patient is aware when to return to the clinic for a follow-up visit. Patient educated on when it is appropriate to go to the emergency department.    I provided 15 minutes of non-face-to-face time during this encounter.    Worthy Rancher, MD

## 2019-08-03 ENCOUNTER — Other Ambulatory Visit: Payer: Self-pay | Admitting: Family Medicine

## 2019-08-14 ENCOUNTER — Other Ambulatory Visit: Payer: Self-pay | Admitting: Family Medicine

## 2019-08-28 ENCOUNTER — Other Ambulatory Visit: Payer: Self-pay | Admitting: Family Medicine

## 2019-08-28 DIAGNOSIS — F329 Major depressive disorder, single episode, unspecified: Secondary | ICD-10-CM

## 2019-08-28 DIAGNOSIS — G4700 Insomnia, unspecified: Secondary | ICD-10-CM

## 2019-08-28 DIAGNOSIS — F32A Depression, unspecified: Secondary | ICD-10-CM

## 2019-08-29 ENCOUNTER — Other Ambulatory Visit: Payer: Self-pay | Admitting: Family Medicine

## 2019-08-29 DIAGNOSIS — F32A Depression, unspecified: Secondary | ICD-10-CM

## 2019-08-29 DIAGNOSIS — G4700 Insomnia, unspecified: Secondary | ICD-10-CM

## 2019-08-29 DIAGNOSIS — F329 Major depressive disorder, single episode, unspecified: Secondary | ICD-10-CM

## 2019-08-31 ENCOUNTER — Telehealth: Payer: Self-pay | Admitting: Family Medicine

## 2019-08-31 NOTE — Telephone Encounter (Signed)
  Medication Request  08/31/2019  What is the name of the medication? Alprazelam  Have you contacted your pharmacy to request a refill? Yes  Which pharmacy would you like this sent to? [facility]   Patient notified that their request is being sent to the clinical staff for review and that they should receive a call once it is complete. If they do not receive a call within 24 hours they can check with their pharmacy or our office.   Dettinger's pt.  Please call her.

## 2019-08-31 NOTE — Telephone Encounter (Signed)
Left message to call back  

## 2019-09-01 NOTE — Telephone Encounter (Signed)
It is the office policy and she initialed right next to it on the agreement last June that says that she understood this, we do not give controlled substances outside of a visit.  If she wants a copy of her contract from last June then please mail it to her.

## 2019-09-02 NOTE — Telephone Encounter (Signed)
Attempted to contact patient - NA. Patient has a follow up 10/21/19

## 2019-09-04 NOTE — Telephone Encounter (Signed)
For nurse of Dr. Louanne Skye.  Please watch for cancellations each day and get in touch with this patient to have office visit for xanax refills and new contract.  Could possibly call and set of virtual visit per your provider.

## 2019-09-04 NOTE — Telephone Encounter (Signed)
She can be on a virtual appointment.  And if we send a message to [place] she can watch for any cancellations.

## 2019-09-04 NOTE — Telephone Encounter (Signed)
Aware.  She will check back for an open appointment to try to have an office visit with provider to get her xanax refills.

## 2019-09-04 NOTE — Telephone Encounter (Signed)
FYI,  She wanted to say that she has had a stroke and thinks it may affect her memory.  She agrees,  she probably signed the contract but really don't remember. If you have any cancellations,  she would be happy to know.

## 2019-09-10 ENCOUNTER — Telehealth: Payer: Self-pay | Admitting: Family Medicine

## 2019-09-10 ENCOUNTER — Ambulatory Visit (INDEPENDENT_AMBULATORY_CARE_PROVIDER_SITE_OTHER): Payer: Medicare Other | Admitting: Family Medicine

## 2019-09-10 NOTE — Telephone Encounter (Signed)
Appointment scheduled with Dr. Louanne Skye on 09/28/19 at 8:55 am, patient aware.

## 2019-09-10 NOTE — Telephone Encounter (Signed)
Will watch for cancellations

## 2019-09-10 NOTE — Progress Notes (Signed)
Patient did not answer after 2 calls.

## 2019-09-15 ENCOUNTER — Telehealth: Payer: Self-pay | Admitting: Family Medicine

## 2019-09-15 NOTE — Telephone Encounter (Signed)
Pt needs appt for EKG

## 2019-09-15 NOTE — Telephone Encounter (Signed)
No other symptoms, she has appt  On 09/28/19 for xanax refill> Dr Oris Drone pt

## 2019-09-16 NOTE — Telephone Encounter (Signed)
Patient aware - appt made 

## 2019-09-17 ENCOUNTER — Ambulatory Visit (INDEPENDENT_AMBULATORY_CARE_PROVIDER_SITE_OTHER): Payer: Medicare Other | Admitting: Family

## 2019-09-17 ENCOUNTER — Encounter: Payer: Self-pay | Admitting: Family

## 2019-09-17 ENCOUNTER — Other Ambulatory Visit: Payer: Self-pay

## 2019-09-17 VITALS — BP 119/85 | HR 99 | Temp 98.4°F | Ht 66.0 in | Wt 172.2 lb

## 2019-09-17 DIAGNOSIS — F419 Anxiety disorder, unspecified: Secondary | ICD-10-CM | POA: Diagnosis not present

## 2019-09-17 DIAGNOSIS — G4700 Insomnia, unspecified: Secondary | ICD-10-CM

## 2019-09-17 DIAGNOSIS — Z79899 Other long term (current) drug therapy: Secondary | ICD-10-CM

## 2019-09-17 DIAGNOSIS — R002 Palpitations: Secondary | ICD-10-CM

## 2019-09-17 DIAGNOSIS — F329 Major depressive disorder, single episode, unspecified: Secondary | ICD-10-CM

## 2019-09-17 DIAGNOSIS — F32A Depression, unspecified: Secondary | ICD-10-CM

## 2019-09-17 DIAGNOSIS — R000 Tachycardia, unspecified: Secondary | ICD-10-CM

## 2019-09-17 DIAGNOSIS — F1320 Sedative, hypnotic or anxiolytic dependence, uncomplicated: Secondary | ICD-10-CM | POA: Insufficient documentation

## 2019-09-17 MED ORDER — ALPRAZOLAM 0.5 MG PO TABS: 0.5000 mg | ORAL_TABLET | Freq: Three times a day (TID) | ORAL | 0 refills | Status: DC | PRN

## 2019-09-17 NOTE — Progress Notes (Signed)
Subjective:    Patient ID: Wendy Collier, female    DOB: 1965/10/26, 54 y.o.   MRN: [id number]  Chief Complaint  Patient presents with  . Tachycardia    has been up to 145 bpm this week while reasting    PT presents to the office today with palpitations that she noticed over the weekend when she took her BP. She states she normally takes her Xanax 0.5 mg BID, but her refills have got mixed up and does not have an appointment 10/28/19. She has decreased her xanax to every other day and is unsure if this is causing her palpitation.  Palpitations  This is a new problem. The current episode started in the past 7 days. The problem occurs constantly. The problem has been unchanged. The symptoms are aggravated by stress. Associated symptoms include anxiety and an irregular heartbeat. The treatment provided moderate relief.  Thyroid Problem Presents for follow-up visit. Symptoms include anxiety and palpitations. Patient reports no depressed mood or diarrhea.      Review of Systems  Cardiovascular: Positive for palpitations.  Gastrointestinal: Negative for diarrhea.  Psychiatric/Behavioral: The patient is nervous/anxious.   All other systems reviewed and are negative.      Objective:   Physical Exam Vitals reviewed.  Constitutional:      General: She is not in acute distress.    Appearance: She is well-developed.  HENT:     Head: Normocephalic and atraumatic.     Right Ear: Tympanic membrane normal.     Left Ear: Tympanic membrane normal.  Eyes:     Pupils: Pupils are equal, round, and reactive to light.  Neck:     Thyroid: No thyromegaly.  Cardiovascular:     Rate and Rhythm: Normal rate and regular rhythm.     Heart sounds: Normal heart sounds. No murmur.  Pulmonary:     Effort: Pulmonary effort is normal. No respiratory distress.     Breath sounds: Normal breath sounds. No wheezing.  Abdominal:     General: Bowel sounds are normal. There is no distension.     Palpations:  Abdomen is soft.     Tenderness: There is no abdominal tenderness.  Musculoskeletal:        General: No tenderness. Normal range of motion.     Cervical back: Normal range of motion and neck supple.  Skin:    General: Skin is warm and dry.  Neurological:     Mental Status: She is alert and oriented to person, place, and time.     Cranial Nerves: No cranial nerve deficit.     Deep Tendon Reflexes: Reflexes are normal and symmetric.  Psychiatric:        Behavior: Behavior normal.        Thought Content: Thought content normal.        Judgment: Judgment normal.       BP 119/85   Pulse 99   Temp 98.4 F (36.9 C) (Temporal)   Ht '5\' 6"'  (1.676 m)   Wt 172 lb 3.2 oz (78.1 kg)   LMP 08/28/2016 (Approximate) Comment: Pt reports being unknown when she had her last period, but had spotting two days ago.   SpO2 99%   BMI 27.79 kg/m      Assessment & Plan:  Wendy Collier comes in today with chief complaint of Tachycardia (has been up to 145 bpm this week while reasting )   Diagnosis and orders addressed:  1. Tachycardia - EKG 12-Lead - CMP14+EGFR -  CBC with Differential/Platelet - TSH - ToxASSURE Select 13 (MW), Urine  2. Palpitations - CMP14+EGFR - CBC with Differential/Platelet - TSH - ToxASSURE Select 13 (MW), Urine  3. Anxiety and depression - ALPRAZolam (XANAX) 0.5 MG tablet; Take 1 tablet (0.5 mg total) by mouth 3 (three) times daily as needed for anxiety.  Dispense: 90 tablet; Refill: 0  4. Insomnia, unspecified type - ALPRAZolam (XANAX) 0.5 MG tablet; Take 1 tablet (0.5 mg total) by mouth 3 (three) times daily as needed for anxiety.  Dispense: 90 tablet; Refill: 0  5. Controlled substance agreement signed - ALPRAZolam (XANAX) 0.5 MG tablet; Take 1 tablet (0.5 mg total) by mouth 3 (three) times daily as needed for anxiety.  Dispense: 90 tablet; Refill: 0  6. Benzodiazepine dependence (HCC) - ALPRAZolam (XANAX) 0.5 MG tablet; Take 1 tablet (0.5 mg total) by mouth 3  (three) times daily as needed for anxiety.  Dispense: 90 tablet; Refill: 0   Will reviewed in [place] controlled database, drug screen and contract up dated today. Pt will follow up with PCP.  Avoid caffeine Stress management  Keep follow up with PCP in 2 weeks   Evelina Dun, FNP

## 2019-09-17 NOTE — Patient Instructions (Signed)

## 2019-09-18 LAB — CMP14+EGFR
ALT: 20 IU/L (ref 0–32)
AST: 21 IU/L (ref 0–40)
Albumin/Globulin Ratio: 2.5 — ABNORMAL HIGH (ref 1.2–2.2)
Albumin: 5 g/dL — ABNORMAL HIGH (ref 3.8–4.9)
Alkaline Phosphatase: 145 IU/L — ABNORMAL HIGH (ref 39–117)
BUN/Creatinine Ratio: 22 (ref 9–23)
BUN: 17 mg/dL (ref 6–24)
Bilirubin Total: 0.3 mg/dL (ref 0.0–1.2)
CO2: 25 mmol/L (ref 20–29)
Calcium: 10.4 mg/dL — ABNORMAL HIGH (ref 8.7–10.2)
Chloride: 99 mmol/L (ref 96–106)
Creatinine, Ser: 0.78 mg/dL (ref 0.57–1.00)
GFR calc Af Amer: 102 mL/min/{1.73_m2} (ref >59)
GFR calc non Af Amer: 88 mL/min/{1.73_m2} (ref >59)
Globulin, Total: 2 g/dL (ref 1.5–4.5)
Glucose: 108 mg/dL — ABNORMAL HIGH (ref 65–99)
Potassium: 4.3 mmol/L (ref 3.5–5.2)
Sodium: 141 mmol/L (ref 134–144)
Total Protein: 7 g/dL (ref 6.0–8.5)

## 2019-09-18 LAB — CBC WITH DIFFERENTIAL/PLATELET
Basophils Absolute: 0 10*3/uL (ref 0.0–0.2)
Basos: 1 %
EOS (ABSOLUTE): 0.3 10*3/uL (ref 0.0–0.4)
Eos: 4 %
Hematocrit: 42.3 % (ref 34.0–46.6)
Hemoglobin: 14.2 g/dL (ref 11.1–15.9)
Immature Grans (Abs): 0 10*3/uL (ref 0.0–0.1)
Immature Granulocytes: 0 %
Lymphocytes Absolute: 2.2 10*3/uL (ref 0.7–3.1)
Lymphs: 37 %
MCH: 28.8 pg (ref 26.6–33.0)
MCHC: 33.6 g/dL (ref 31.5–35.7)
MCV: 86 fL (ref 79–97)
Monocytes Absolute: 0.5 10*3/uL (ref 0.1–0.9)
Monocytes: 9 %
Neutrophils Absolute: 2.9 10*3/uL (ref 1.4–7.0)
Neutrophils: 49 %
Platelets: 227 10*3/uL (ref 150–450)
RBC: 4.93 x10E6/uL (ref 3.77–5.28)
RDW: 12.7 % (ref 11.7–15.4)
WBC: 5.8 10*3/uL (ref 3.4–10.8)

## 2019-09-18 LAB — TSH: TSH: 0.817 u[IU]/mL (ref 0.450–4.500)

## 2019-09-22 ENCOUNTER — Other Ambulatory Visit: Payer: Self-pay | Admitting: Family

## 2019-09-22 LAB — TOXASSURE SELECT 13 (MW), URINE

## 2019-09-28 ENCOUNTER — Other Ambulatory Visit: Payer: Self-pay

## 2019-09-28 ENCOUNTER — Encounter: Payer: Self-pay | Admitting: Family Medicine

## 2019-09-28 ENCOUNTER — Ambulatory Visit (INDEPENDENT_AMBULATORY_CARE_PROVIDER_SITE_OTHER): Payer: Medicare Other | Admitting: Family Medicine

## 2019-09-28 DIAGNOSIS — F1320 Sedative, hypnotic or anxiolytic dependence, uncomplicated: Secondary | ICD-10-CM

## 2019-09-28 DIAGNOSIS — Z79899 Other long term (current) drug therapy: Secondary | ICD-10-CM

## 2019-09-28 DIAGNOSIS — F329 Major depressive disorder, single episode, unspecified: Secondary | ICD-10-CM

## 2019-09-28 DIAGNOSIS — I611 Nontraumatic intracerebral hemorrhage in hemisphere, cortical: Secondary | ICD-10-CM | POA: Diagnosis not present

## 2019-09-28 DIAGNOSIS — G4700 Insomnia, unspecified: Secondary | ICD-10-CM

## 2019-09-28 DIAGNOSIS — I619 Nontraumatic intracerebral hemorrhage, unspecified: Secondary | ICD-10-CM | POA: Diagnosis not present

## 2019-09-28 DIAGNOSIS — F419 Anxiety disorder, unspecified: Secondary | ICD-10-CM

## 2019-09-28 DIAGNOSIS — F32A Depression, unspecified: Secondary | ICD-10-CM

## 2019-09-28 MED ORDER — METOPROLOL SUCCINATE ER 25 MG PO TB24: 25.0000 mg | ORAL_TABLET | Freq: Every day | ORAL | 3 refills | Status: DC

## 2019-09-28 MED ORDER — LEVOTHYROXINE SODIUM 75 MCG PO TABS: 75.0000 ug | ORAL_TABLET | Freq: Every day | ORAL | 1 refills | Status: DC

## 2019-09-28 MED ORDER — PANTOPRAZOLE SODIUM 40 MG PO TBEC: 40.0000 mg | DELAYED_RELEASE_TABLET | Freq: Every day | ORAL | 3 refills | Status: DC

## 2019-09-28 MED ORDER — TIZANIDINE HCL 4 MG PO TABS: 4.0000 mg | ORAL_TABLET | Freq: Three times a day (TID) | ORAL | 3 refills | Status: DC

## 2019-09-28 MED ORDER — ALPRAZOLAM 0.5 MG PO TABS: 0.5000 mg | ORAL_TABLET | Freq: Three times a day (TID) | ORAL | 2 refills | Status: DC | PRN

## 2019-09-28 NOTE — Progress Notes (Signed)
BP 112/72   Pulse 100   Temp 99.6 F (37.6 C)   Ht '5\' 6"'  (1.676 m)   Wt 171 lb 2 oz (77.6 kg)   LMP 08/28/2016 (Approximate) Comment: Pt reports being unknown when she had her last period, but had spotting two days ago.   SpO2 100%   BMI 27.62 kg/m    Subjective:   Patient ID: Wendy Collier, female    DOB: 01-25-1966, 54 y.o.   MRN: [id number]  HPI: Wendy Collier is a 54 y.o. 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  She takes alprazolam and also the duloxetine which does come to help calm things down and she also takes Zyprexa in the evenings to help calm things down and sleep.  She denies any major issues except for sometimes her anxiety will build up and she takes the alprazolam to help with that. Current rx-alprazolam 0.5 mg 3 times daily as needed # meds rx-90 Effectiveness of current meds-working well Adverse reactions form meds-none  Pill count performed-No Last drug screen -09/23/2019, her drug screen came back positive for oxycodone but she has a prescription that she brought with her today from a year ago and she did happen to just take a half a tablet and she does every now and then take a half a tablet, she was warned about the issues with mixing the 2. ( high risk q38m moderate risk q656mlow risk yearly ) Urine drug screen today- No [place]  If yes were their any concerning findings? -None  No flowsheet data found.   Controlled substance contract signed on: 09/23/2019  Relevant past medical, surgical, family and social history reviewed and updated as indicated. Interim medical history since our last visit reviewed. Allergies and medications reviewed and updated.  Review of Systems  Constitutional: Negative for chills and fever.   Eyes: Negative for visual disturbance.  Respiratory: Negative for chest tightness and shortness of breath.   Cardiovascular: Negative for chest pain and leg swelling.  Musculoskeletal: Negative for back pain and gait problem.  Skin: Negative for rash.  Neurological: Negative for light-headedness and headaches.  Psychiatric/Behavioral: Positive for dysphoric mood. Negative for agitation, behavioral problems, self-injury, sleep disturbance and suicidal ideas. The patient is nervous/anxious.   All other systems reviewed and are negative.   Per HPI unless specifically indicated above   Allergies as of 09/28/2019      Reactions   Silver Rash   Tegaderm Tegaderm Tegaderm Tegaderm Tegaderm Tegaderm      Medication List       Accurate as of September 28, 2019  9:28 AM. If you have any questions, ask your nurse or doctor.        ALPRAZolam 0.5 MG tablet Commonly known as: XANAX Take 1 tablet (0.5 mg total) by mouth 3 (three) times daily as needed for anxiety. Start taking on: October 17, 2019 What changed: These instructions start on October 17, 2019. If you are unsure what to do until then, ask your doctor or other care provider. Changed by: JoFransisca Kaufmannettinger, MD   benazepril 10 MG tablet Commonly known as: LOTENSIN Take 3 tablets (30 mg total) by mouth daily.   DULoxetine 30 MG capsule Commonly known as: CYMBALTA Take 2 capsules (60 mg total)  by mouth daily. With two 20 mg capsules   hydrochlorothiazide 25 MG tablet Commonly known as: HYDRODIURIL Take 1 tablet (25 mg total) by mouth daily.   levothyroxine 75 MCG tablet Commonly known as: SYNTHROID Take 1 tablet (75 mcg total) by mouth daily before breakfast.   metoprolol succinate 25 MG 24 hr tablet Commonly known as: TOPROL-XL Take 1 tablet (25 mg total) by mouth daily.   multivitamin with minerals tablet Take 1 tablet by mouth daily.   OLANZapine 7.5 MG tablet Commonly known as: ZyPREXA Take 1 tablet (7.5 mg total)  by mouth at bedtime.   pantoprazole 40 MG tablet Commonly known as: PROTONIX Take 1 tablet (40 mg total) by mouth at bedtime.   rosuvastatin 10 MG tablet Commonly known as: CRESTOR   Senna S 8.6-50 MG tablet Generic drug: senna-docusate TAKE (2) TABLETS DAILY AT BEDTIME.   tiZANidine 4 MG tablet Commonly known as: ZANAFLEX Take 1 tablet (4 mg total) by mouth 3 (three) times daily.        Objective:   BP 112/72   Pulse 100   Temp 99.6 F (37.6 C)   Ht '5\' 6"'  (1.676 m)   Wt 171 lb 2 oz (77.6 kg)   LMP 08/28/2016 (Approximate) Comment: Pt reports being unknown when she had her last period, but had spotting two days ago.   SpO2 100%   BMI 27.62 kg/m   Wt Readings from Last 3 Encounters:  09/28/19 171 lb 2 oz (77.6 kg)  09/17/19 172 lb 3.2 oz (78.1 kg)  04/16/19 182 lb (82.6 kg)    Physical Exam Vitals and nursing note reviewed.  Constitutional:      General: She is not in acute distress.    Appearance: She is well-developed. She is not diaphoretic.  Eyes:     Conjunctiva/sclera: Conjunctivae normal.  Cardiovascular:     Rate and Rhythm: Normal rate and regular rhythm.     Heart sounds: Normal heart sounds. No murmur.  Pulmonary:     Effort: Pulmonary effort is normal. No respiratory distress.     Breath sounds: Normal breath sounds. No wheezing.  Musculoskeletal:        General: No tenderness. Normal range of motion.  Skin:    General: Skin is warm and dry.     Findings: No rash.  Neurological:     Mental Status: She is alert and oriented to person, place, and time.     Coordination: Coordination normal.  Psychiatric:        Behavior: Behavior normal.     Results for orders placed or performed in visit on 09/17/19  CMP14+EGFR  Result Value Ref Range   Glucose 108 (H) 65 - 99 mg/dL   BUN 17 6 - 24 mg/dL   Creatinine, Ser 0.78 0.57 - 1.00 mg/dL   GFR calc non Af Amer 88 >59 mL/min/1.73   GFR calc Af Amer 102 >59 mL/min/1.73   BUN/Creatinine Ratio 22 9  - 23   Sodium 141 134 - 144 mmol/L   Potassium 4.3 3.5 - 5.2 mmol/L   Chloride 99 96 - 106 mmol/L   CO2 25 20 - 29 mmol/L   Calcium 10.4 (H) 8.7 - 10.2 mg/dL   Total Protein 7.0 6.0 - 8.5 g/dL   Albumin 5.0 (H) 3.8 - 4.9 g/dL   Globulin, Total 2.0 1.5 - 4.5 g/dL   Albumin/Globulin Ratio 2.5 (H) 1.2 - 2.2   Bilirubin Total 0.3 0.0 - 1.2 mg/dL   Alkaline  Phosphatase 145 (H) 39 - 117 IU/L   AST 21 0 - 40 IU/L   ALT 20 0 - 32 IU/L  CBC with Differential/Platelet  Result Value Ref Range   WBC 5.8 3.4 - 10.8 x10E3/uL   RBC 4.93 3.77 - 5.28 x10E6/uL   Hemoglobin 14.2 11.1 - 15.9 g/dL   Hematocrit 42.3 34.0 - 46.6 %   MCV 86 79 - 97 fL   MCH 28.8 26.6 - 33.0 pg   MCHC 33.6 31.5 - 35.7 g/dL   RDW 12.7 11.7 - 15.4 %   Platelets 227 150 - 450 x10E3/uL   Neutrophils 49 Not Estab. %   Lymphs 37 Not Estab. %   Monocytes 9 Not Estab. %   Eos 4 Not Estab. %   Basos 1 Not Estab. %   Neutrophils Absolute 2.9 1.4 - 7.0 x10E3/uL   Lymphocytes Absolute 2.2 0.7 - 3.1 x10E3/uL   Monocytes Absolute 0.5 0.1 - 0.9 x10E3/uL   EOS (ABSOLUTE) 0.3 0.0 - 0.4 x10E3/uL   Basophils Absolute 0.0 0.0 - 0.2 x10E3/uL   Immature Granulocytes 0 Not Estab. %   Immature Grans (Abs) 0.0 0.0 - 0.1 x10E3/uL  TSH  Result Value Ref Range   TSH 0.817 0.450 - 4.500 uIU/mL  ToxASSURE Select 13 (MW), Urine  Result Value Ref Range   Summary Note     Assessment & Plan:   Problem List Items Addressed This Visit      Nervous and Auditory   ICH (intracerebral hemorrhage) (HCC)   Relevant Medications   metoprolol succinate (TOPROL-XL) 25 MG 24 hr tablet   Intraparenchymal hematoma of brain (HCC)   Relevant Medications   metoprolol succinate (TOPROL-XL) 25 MG 24 hr tablet     Other   Anxiety and depression   Relevant Medications   ALPRAZolam (XANAX) 0.5 MG tablet (Start on 10/17/2019)   Controlled substance agreement signed   Relevant Medications   ALPRAZolam (XANAX) 0.5 MG tablet (Start on 10/17/2019)    Benzodiazepine dependence (HCC)   Relevant Medications   ALPRAZolam (XANAX) 0.5 MG tablet (Start on 10/17/2019)    Other Visit Diagnoses    Right sided weakness       Relevant Medications   metoprolol succinate (TOPROL-XL) 25 MG 24 hr tablet   Insomnia, unspecified type       Relevant Medications   ALPRAZolam (XANAX) 0.5 MG tablet (Start on 10/17/2019)      Patient will continue with the alprazolam, did send refills of some other medications for her.  Drug screen showing oxycodone and she does have a prescription from a year ago which she still has a few left.  Warned of mixing the 2 Follow up plan: Return in about 3 months (around 12/28/2019), or if symptoms worsen or fail to improve, for Hypertension anxiety recheck.  Counseling provided for all of the vaccine components No orders of the defined types were placed in this encounter.   Caryl Pina, MD [facility] 09/28/2019, 9:28 AM

## 2019-09-29 ENCOUNTER — Telehealth: Payer: Self-pay

## 2019-09-29 NOTE — Telephone Encounter (Signed)
Left message for pt to return call.  Per Dr. Louanne Skye results are normal. Showed a normal, fast heart rate. Patient should practice relaxation techniques as discussed.  Fast heart rate likely due to anxiety/nervousness. Patient can be referred to cardio if needed.

## 2019-10-01 ENCOUNTER — Encounter: Payer: Self-pay | Admitting: Family Medicine

## 2019-10-01 ENCOUNTER — Encounter: Payer: Self-pay | Admitting: *Deleted

## 2019-10-01 NOTE — Telephone Encounter (Signed)
Can not leave a message, asking for extension number. Letter mailed.

## 2019-10-06 ENCOUNTER — Telehealth: Payer: Self-pay | Admitting: Family Medicine

## 2019-10-06 DIAGNOSIS — F32A Depression, unspecified: Secondary | ICD-10-CM

## 2019-10-06 DIAGNOSIS — F419 Anxiety disorder, unspecified: Secondary | ICD-10-CM

## 2019-10-06 DIAGNOSIS — F329 Major depressive disorder, single episode, unspecified: Secondary | ICD-10-CM

## 2019-10-06 NOTE — Telephone Encounter (Signed)
Patient aware and verbalized understanding. Per notes in phone call. Needs a new rx written for her Cymbalta she can not find.

## 2019-10-07 MED ORDER — DULOXETINE HCL 30 MG PO CPEP: 60.0000 mg | ORAL_CAPSULE | Freq: Every day | ORAL | 3 refills | Status: DC

## 2019-10-07 NOTE — Telephone Encounter (Signed)
Sent cymbalta for her

## 2019-10-21 ENCOUNTER — Ambulatory Visit: Payer: Medicare Other | Admitting: Family Medicine

## 2019-11-03 ENCOUNTER — Other Ambulatory Visit: Payer: Self-pay | Admitting: Physical Medicine and Rehabilitation

## 2019-11-10 ENCOUNTER — Other Ambulatory Visit: Payer: Self-pay | Admitting: Physical Medicine and Rehabilitation

## 2019-11-28 IMAGING — CT CT ANGIOGRAPHY HEAD
3 of 10 series · 16 of 47 positions shown · IV contrast (Isovue)
Comparison: None.

CLINICAL DATA: Severe headache

EXAM:
CT ANGIOGRAPHY HEAD
TECHNIQUE: Multidetector CT imaging of the head was performed using the
standard protocol during bolus administration of intravenous
contrast. Multiplanar CT image reconstructions and MIPs were
obtained to evaluate the vascular anatomy.
CONTRAST:  75mL OMNIPAQUE IOHEXOL 350 MG/ML SOLN

[Series 11: ax thin · axial · 0.41mm/px · z∈[+24,+160]mm · 10 of 161 slices shown]
[im 12/161  brain]
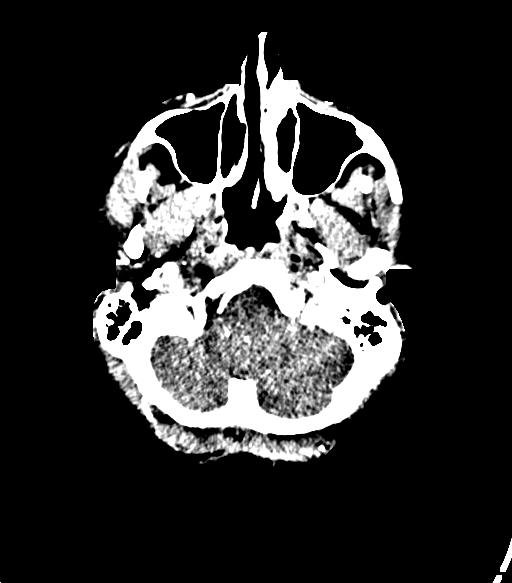
[im 23/161  bone]
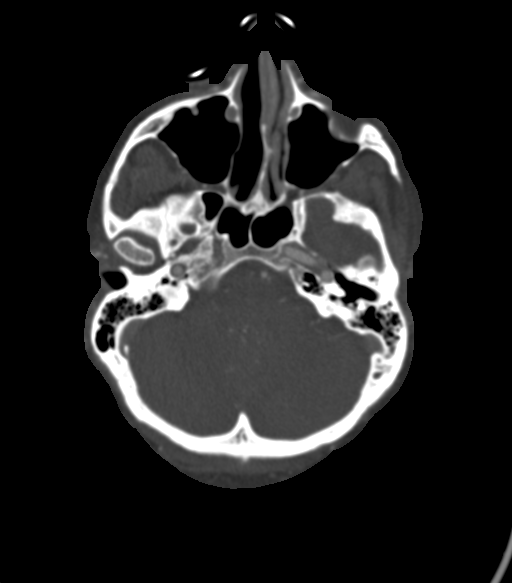
[im 46/161  brain]
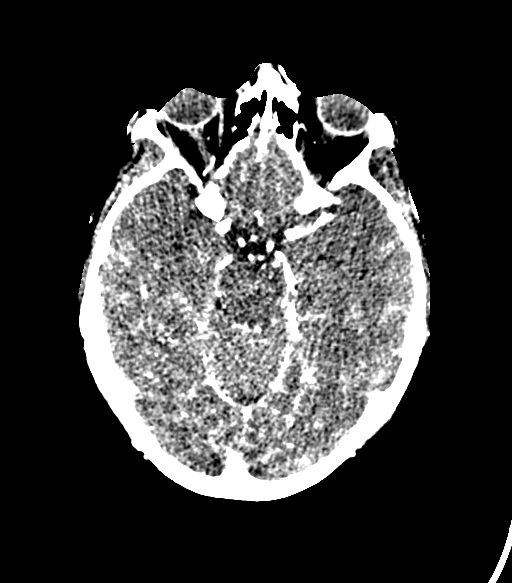
[im 58/161  bone]
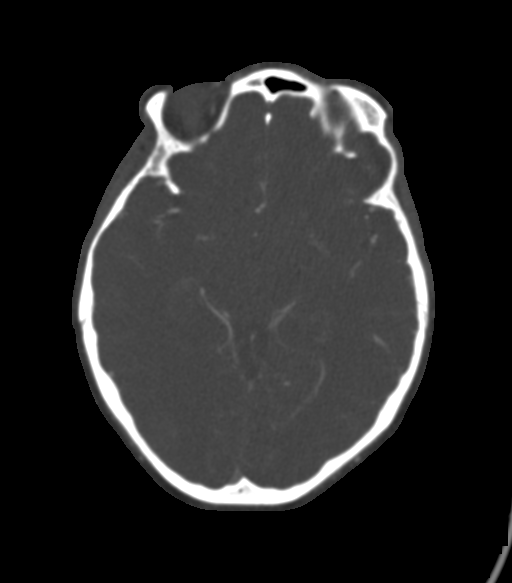
[im 69/161  brain]
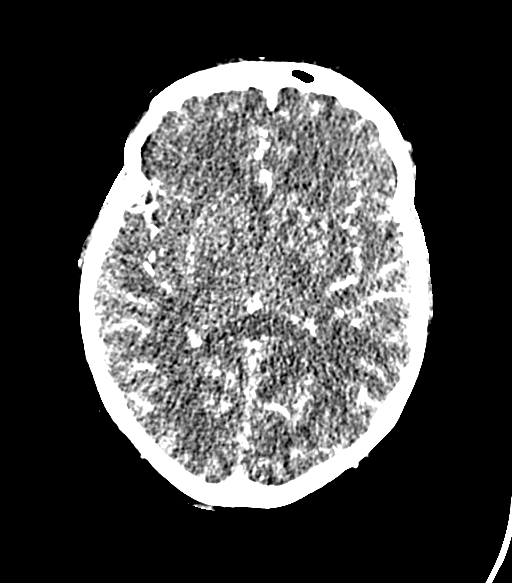
[im 92/161  bone]
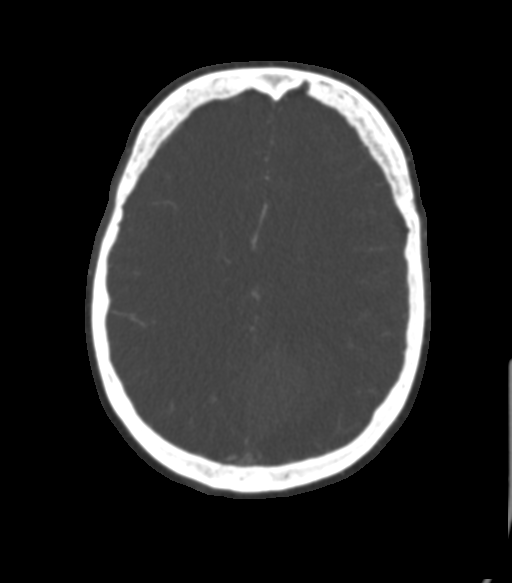
[im 103/161  brain]
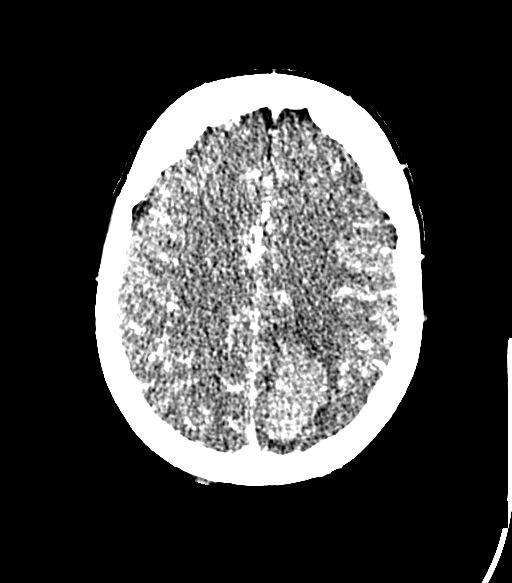
[im 115/161  bone]
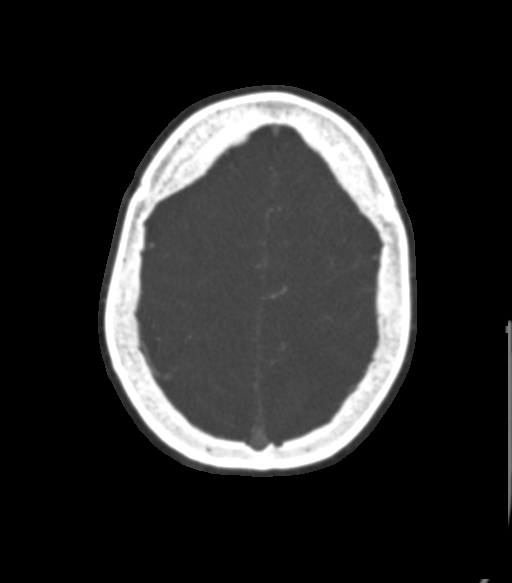
[im 138/161  brain]
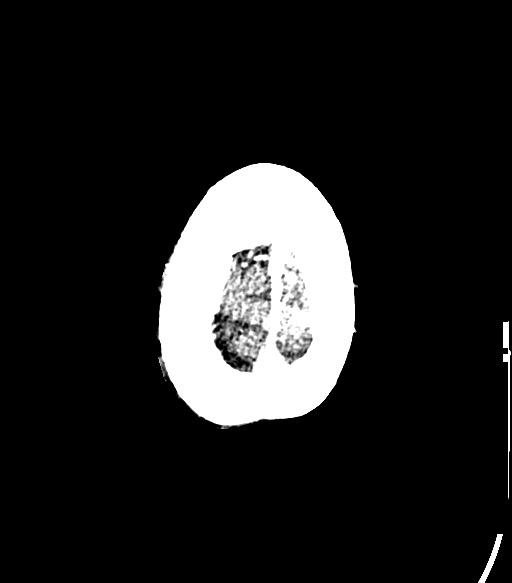
[im 149/161  bone]
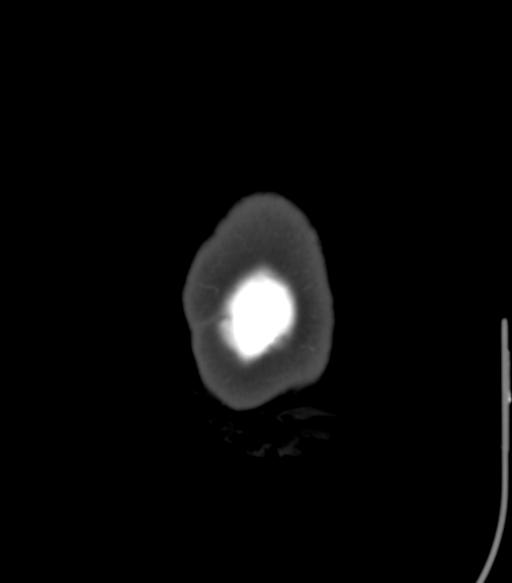

[Series 13: cor thin · coronal · 0.35mm/px · 3 of 201 slices shown]
[im 67/201  brain]
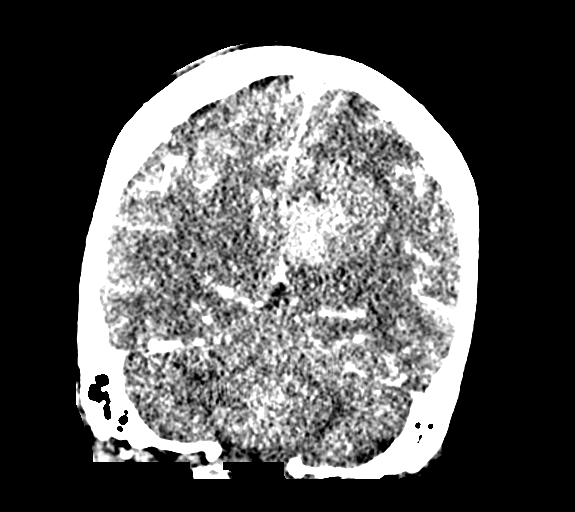
[im 101/201  brain]
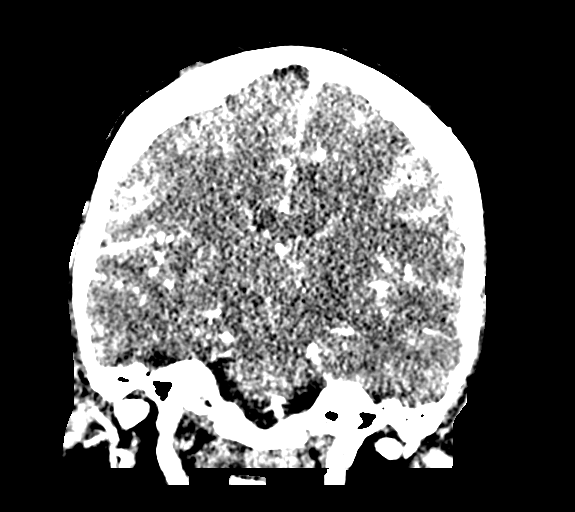
[im 134/201  brain]
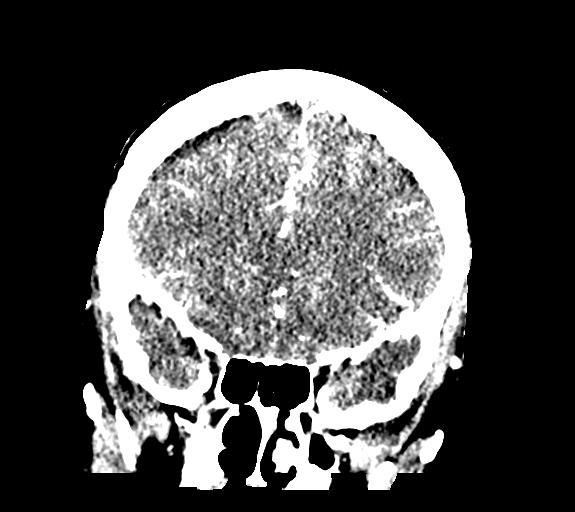

[Series 15: sag thin · sagittal · 0.35mm/px · 3 of 172 slices shown]
[im 43/172  brain]
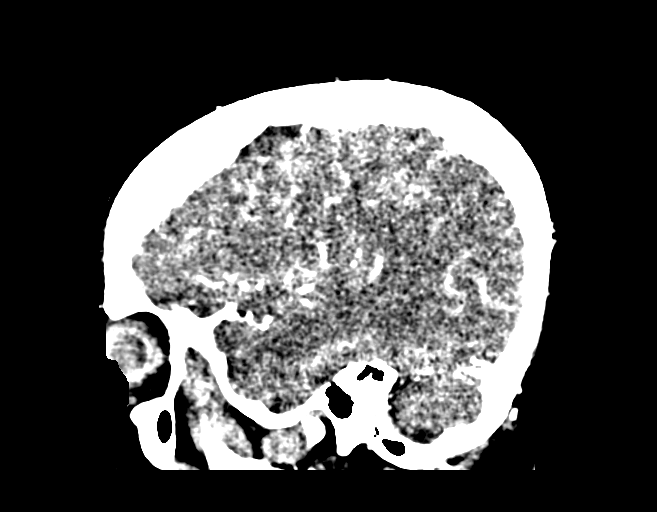
[im 86/172  brain]
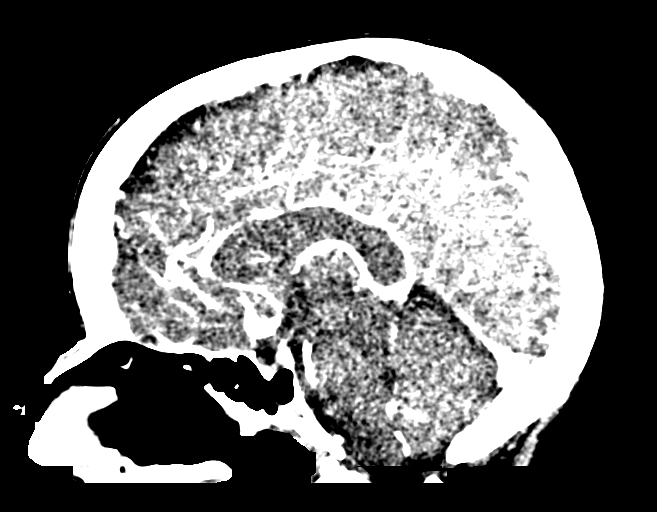
[im 129/172  brain]
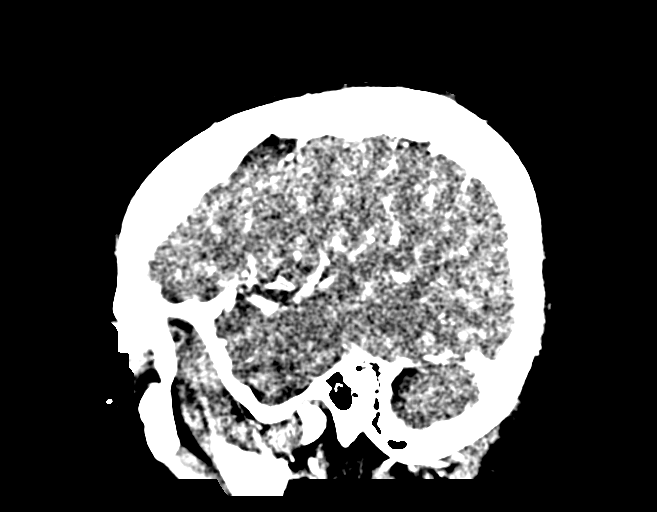

[16 of 47 positions shown; findings below may reference images not displayed]

FINDINGS: CT HEAD FINDINGS

Brain: There is intraparenchymal hemorrhage within the left parietal
lobe that measures 4.8 x 3.3 x 4.0 cm (volume = 33 cm^3). There is
moderate surrounding edema with mass effect on the left occipital
lobe. There is a small amount of subdural extension along the
posterior falx cerebri. No midline shift. Basal cisterns are patent.
No hydrocephalus.

Skull: The visualized skull base, calvarium and extracranial soft
tissues are normal.

Sinuses/Orbits: No fluid levels or advanced mucosal thickening of
the visualized paranasal sinuses. No mastoid or middle ear effusion.
The orbits are normal.

CTA HEAD FINDINGS

POSTERIOR CIRCULATION:

--Basilar artery: Normal.

--Posterior cerebral arteries: Normal. Both originate from the
basilar artery.

--Superior cerebellar arteries: Normal.

--Inferior cerebellar arteries: Normal anterior and posterior
inferior cerebellar arteries.

ANTERIOR CIRCULATION:

--Intracranial internal carotid arteries: Normal.

--Anterior cerebral arteries: Normal. Both A1 segments are present.
Patent anterior communicating artery.

--Middle cerebral arteries: Normal.

--Posterior communicating arteries: Present bilaterally.

VENOUS SINUSES: As permitted by contrast timing, patent.

ANATOMIC VARIANTS: None

DELAYED PHASE: No parenchymal contrast enhancement.

Review of the MIP images confirms the above findings.
IMPRESSION: 1. Lobar intraparenchymal hemorrhage within the left parietal lobe
with volume of 33 mL. Moderate edema with mild mass effect on the
left occipital lobe.
2. Small amount of subdural extension of hematoma along the
posterior falx cerebri.
3. Normal CTA of the intracranial arteries. No aneurysm or vascular
malformation.

Critical Value/emergent results were called by telephone at the time
of interpretation on 11/14/2018 at [DATE] to Dr. PAULUS N CEEJAY ,
who verbally acknowledged these results.

## 2019-12-01 ENCOUNTER — Other Ambulatory Visit: Payer: Self-pay | Admitting: Family Medicine

## 2019-12-03 ENCOUNTER — Ambulatory Visit (INDEPENDENT_AMBULATORY_CARE_PROVIDER_SITE_OTHER): Payer: Medicare Other | Admitting: *Deleted

## 2019-12-03 DIAGNOSIS — Z0000 Encounter for general adult medical examination without abnormal findings: Secondary | ICD-10-CM

## 2019-12-03 NOTE — Progress Notes (Signed)
MEDICARE ANNUAL WELLNESS VISIT  12/03/2019  Telephone Visit Disclaimer This Medicare AWV was conducted by telephone due to national recommendations for restrictions regarding the COVID-19 Pandemic (e.g. social distancing).  I verified, using two identifiers, that I am speaking with Wendy Collier or their authorized healthcare agent. I discussed the limitations, risks, security, and privacy concerns of performing an evaluation and management service by telephone and the potential availability of an in-person appointment in the future. The patient expressed understanding and agreed to proceed.   Subjective:  Wendy Collier is a 54 y.o. female patient of Wendy Collier, Wendy Radon, MD who had a Medicare Annual Wellness Visit today via telephone. Wendy Collier is disabled and lives with her husband Wendy Collier. She has one daughter. She reports that she is socially active and does interact with friends/family regularly. She is not physically active and enjoys reading,sewing, and scrap booking.   Patient Care Team: Wendy Collier, Wendy Radon, MD as PCP - General (Family Medicine)  Advanced Directives 12/03/2019 12/02/2018 11/17/2018 11/15/2018 11/14/2018 04/30/2018 08/30/2016  Does Patient Have a Medical Advance Directive? No No No - No No No  Would patient like information on creating a medical advance directive? No - Patient declined No - Patient declined No - Patient declined No - Patient declined - - [facility] Utilization Over the Past 12 Months: # of hospitalizations or ER visits: 0 # of surgeries: 0  Review of Systems    Patient reports that her overall health is better compared to last year.  History obtained from the patient.  Patient Reported Readings (BP, Pulse, CBG, Weight, etc) none  Pain Assessment Pain : No/denies pain     Current Medications & Allergies (verified) Allergies as of 12/03/2019      Reactions   Silver Rash   Tegaderm Tegaderm Tegaderm Tegaderm Tegaderm Tegaderm        Medication List       Accurate as of December 03, 2019  1:33 PM. If you have any questions, ask your nurse or doctor.        ALPRAZolam 0.5 MG tablet Commonly known as: XANAX Take 1 tablet (0.5 mg total) by mouth 3 (three) times daily as needed for anxiety.   benazepril 10 MG tablet Commonly known as: LOTENSIN Take 3 tablets (30 mg total) by mouth daily.   DULoxetine 30 MG capsule Commonly known as: CYMBALTA Take 2 capsules (60 mg total) by mouth daily. With two 20 mg capsules What changed: Another medication with the same name was removed. Continue taking this medication, and follow the directions you see here.   hydrochlorothiazide 25 MG tablet Commonly known as: HYDRODIURIL Take 1 tablet (25 mg total) by mouth daily.   levothyroxine 75 MCG tablet Commonly known as: SYNTHROID Take 1 tablet (75 mcg total) by mouth daily before breakfast.   metoprolol succinate 25 MG 24 hr tablet Commonly known as: TOPROL-XL Take 1 tablet (25 mg total) by mouth daily.   multivitamin with minerals tablet Take 1 tablet by mouth daily.   OLANZapine 7.5 MG tablet Commonly known as: ZyPREXA Take 1 tablet (7.5 mg total) by mouth at bedtime.   pantoprazole 40 MG tablet Commonly known as: PROTONIX Take 1 tablet (40 mg total) by mouth at bedtime.   rosuvastatin 10 MG tablet Commonly known as: CRESTOR   Senna S 8.6-50 MG tablet Generic drug: senna-docusate TAKE (2) TABLETS DAILY AT BEDTIME.   tiZANidine 4 MG tablet Commonly known as: ZANAFLEX Take 1 tablet (4 mg  total) by mouth 3 (three) times daily.       History (reviewed): Past Medical History:  Diagnosis Date  . Anxiety disorder    with panic attacks.   . Arthritis   . Bipolar 1 disorder (HCC)   . Chronic fatigue   . Chronic pain    neck/back with spasms.   . Depression   . Fibromyalgia   . Frequent headaches   . Hypertension   . Thyroid disease    Hypothyroidism    Past Surgical History:  Procedure Laterality Date   . ANTERIOR CERVICAL DECOMP/DISCECTOMY FUSION    . BACK SURGERY    . KNEE SURGERY     Family History  Problem Relation Age of Onset  . Alcohol abuse Mother   . COPD Mother   . Depression Mother   . Drug abuse Mother   . Early death Mother   . Mental illness Mother   . Alcohol abuse Father   . Cancer Father        kidney  . Mental illness Father   . Mental illness Brother        schizophrenia   Social History   Socioeconomic History  . Marital status: Married    Spouse name: Wendy Collier  . Number of children: 1  . Years of education: 42  . Highest education level: Some college, no degree  Occupational History  . Occupation: disability  Tobacco Use  . Smoking status: Never Smoker  . Smokeless tobacco: Never Used  Vaping Use  . Vaping Use: Never used  Substance and Sexual Activity  . Alcohol use: No  . Drug use: No  . Sexual activity: Yes    Birth control/protection: Post-menopausal    Comment: Married since 2010  Other Topics Concern  . Not on file  Social History Narrative  . Not on file   Social Determinants of Health   Financial Resource Strain:   . Difficulty of Paying Living Expenses:   Food Insecurity:   . Worried About Programme researcher, broadcasting/film/video in the Last Year:   . Barista in the Last Year:   Transportation Needs:   . Freight forwarder (Medical):   Marland Kitchen Lack of Transportation (Non-Medical):   Physical Activity:   . Days of Exercise per Week:   . Minutes of Exercise per Session:   Stress:   . Feeling of Stress :   Social Connections:   . Frequency of Communication with Friends and Family:   . Frequency of Social Gatherings with Friends and Family:   . Attends Religious Services:   . Active Member of Clubs or Organizations:   . Attends Banker Meetings:   Marland Kitchen Marital Status:     Activities of Daily Living In your present state of health, do you have any difficulty performing the following activities: 12/03/2019  Hearing? N  Vision?  N  Difficulty concentrating or making decisions? Y  Comment all of them, thinks related to stroke  Walking or climbing stairs? N  Dressing or bathing? N  Doing errands, shopping? N  Preparing Food and eating ? N  Using the Toilet? N  In the past six months, have you accidently leaked urine? Y  Comment a little here and there  Do you have problems with loss of bowel control? N  Managing your Medications? N  Managing your Finances? N  Housekeeping or managing your Housekeeping? N  Some recent data might be hidden    Patient Education/  Literacy How often do you need to have someone help you when you read instructions, pamphlets, or other written materials from your doctor or pharmacy?: 1 - Never What is the last grade level you completed in school?: some college  Exercise Current Exercise Habits: The patient does not participate in regular exercise at present, Exercise limited by: None identified  Diet Patient reports consuming 2 meals a day and 1 snack(s) a day Patient reports that her primary diet is: Regular Patient reports that she does have regular access to food.   Depression Screen PHQ 2/9 Scores 12/03/2019 09/28/2019 04/16/2019 12/12/2018 12/02/2018 11/21/2018 10/10/2016  PHQ - 2 Score 0 0 0 0 0 0 4  PHQ- 9 Score - - - - - - 19     Fall Risk Fall Risk  12/03/2019 09/28/2019 09/17/2019 12/02/2018 11/21/2018  Falls in the past year? 1 1 1 1 1   Number falls in past yr: 1 0 1 0 0  Comment multiple - - - -  Injury with Fall? 0 1 1 0 -  Risk for fall due to : History of fall(s) - - - -  Risk for fall due to: Comment states she is clumsy - - - -  Follow up Falls evaluation completed - - - -     Objective:  [name] seemed alert and oriented and she participated appropriately during our telephone visit.  Blood Pressure Weight BMI  BP Readings from Last 3 Encounters:  09/28/19 112/72  09/17/19 119/85  04/16/19 106/69   Wt Readings from Last 3 Encounters:  09/28/19 171 lb 2 oz  (77.6 kg)  09/17/19 172 lb 3.2 oz (78.1 kg)  04/16/19 182 lb (82.6 kg)   BMI Readings from Last 1 Encounters:  09/28/19 27.62 kg/m    *Unable to obtain current vital signs, weight, and BMI due to telephone visit type  Hearing/Vision  . Micky did not seem to have difficulty with hearing/understanding during the telephone conversation . Reports that she has not had a formal eye exam by an eye care professional within the past year . Reports that she has had a formal hearing evaluation within the past year *Unable to fully assess hearing and vision during telephone visit type  Cognitive Function: 6CIT Screen 12/03/2019 12/02/2018  What Year? 0 points 0 points  What month? 0 points 0 points  What time? 0 points 0 points  Count back from 20 0 points 0 points  Months in reverse 0 points 0 points  Repeat phrase 2 points 2 points  Total Score 2 2   (Normal:0-7, Significant for Dysfunction: >8)  Normal Cognitive Function Screening: Yes   Immunization & Health Maintenance Record Immunization History  Administered Date(s) Administered  . Influenza,inj,Quad PF,6+ Mos 04/16/2019    Health Maintenance  Topic Date Due  . Hepatitis C Screening  Never done  . PAP SMEAR-Modifier  10/02/2014  . MAMMOGRAM  12/03/2017  . TETANUS/TDAP  12/12/2019 (Originally 12/04/1986)  . INFLUENZA VACCINE  01/24/2020  . COLONOSCOPY  10/24/2024  . HIV Screening  Completed       Assessment  This is a routine wellness examination for [name].  Health Maintenance: Due or Overdue Health Maintenance Due  Topic Date Due  . Hepatitis C Screening  Never done  . PAP SMEAR-Modifier  10/02/2014  . MAMMOGRAM  12/03/2017    [name] does not need a referral for Community Assistance: Care Management:   no Social Work:    no Prescription Assistance:  no Nutrition/Diabetes Education:  no   Plan:  Personalized Goals Goals Addressed            This Visit's Progress   . Patient Stated        12/03/2019 AWV Goal: Fall Prevention  . Over the next year, patient will decrease their risk for falls by: o Using assistive devices, such as a cane or walker, as needed o Identifying fall risks within their home and correcting them by: - Removing throw rugs - Adding handrails to stairs or ramps - Removing clutter and keeping a clear pathway throughout the home - Increasing light, especially at night - Adding shower handles/bars - Raising toilet seat o Identifying potential personal risk factors for falls: - Medication side effects - Incontinence/urgency - Vestibular dysfunction - Hearing loss - Musculoskeletal disorders - Neurological disorders - Orthostatic hypotension        Personalized Health Maintenance & Screening Recommendations  Screening Pap smear and pelvic exam , mammogram  Lung Cancer Screening Recommended: no (Low Dose CT Chest recommended if Age 20-80 years, 30 pack-year currently smoking OR have quit w/in past 15 years) Hepatitis C Screening recommended: no HIV Screening recommended: no  Advanced Directives: Written information was not prepared per patient's request.  Referrals & Orders No orders of the defined types were placed in this encounter.   Follow-up Plan . Follow-up with Wendy Collier, Wendy Radon, MD as planned . Pap smear and mammogram   I have personally reviewed and noted the following in the patient's chart:   . Medical and social history . Use of alcohol, tobacco or illicit drugs  . Current medications and supplements . Functional ability and status . Nutritional status . Physical activity . Advanced directives . List of other physicians . Hospitalizations, surgeries, and ER visits in previous 12 months . Vitals . Screenings to include cognitive, depression, and falls . Referrals and appointments  In addition, I have reviewed and discussed with Wendy Collier certain preventive protocols, quality metrics, and best practice  recommendations. A written personalized care plan for preventive services as well as general preventive health recommendations is available and can be mailed to the patient at her request.      Adella Hare, LPN  09/07/4006

## 2019-12-30 ENCOUNTER — Ambulatory Visit (INDEPENDENT_AMBULATORY_CARE_PROVIDER_SITE_OTHER): Payer: Medicare Other | Admitting: Family Medicine

## 2019-12-30 ENCOUNTER — Encounter: Payer: Self-pay | Admitting: Family Medicine

## 2019-12-30 DIAGNOSIS — F1320 Sedative, hypnotic or anxiolytic dependence, uncomplicated: Secondary | ICD-10-CM

## 2019-12-30 DIAGNOSIS — F329 Major depressive disorder, single episode, unspecified: Secondary | ICD-10-CM

## 2019-12-30 DIAGNOSIS — Z79899 Other long term (current) drug therapy: Secondary | ICD-10-CM

## 2019-12-30 DIAGNOSIS — I619 Nontraumatic intracerebral hemorrhage, unspecified: Secondary | ICD-10-CM

## 2019-12-30 DIAGNOSIS — E039 Hypothyroidism, unspecified: Secondary | ICD-10-CM | POA: Diagnosis not present

## 2019-12-30 DIAGNOSIS — G4700 Insomnia, unspecified: Secondary | ICD-10-CM

## 2019-12-30 DIAGNOSIS — E785 Hyperlipidemia, unspecified: Secondary | ICD-10-CM | POA: Insufficient documentation

## 2019-12-30 DIAGNOSIS — I10 Essential (primary) hypertension: Secondary | ICD-10-CM

## 2019-12-30 DIAGNOSIS — F419 Anxiety disorder, unspecified: Secondary | ICD-10-CM

## 2019-12-30 DIAGNOSIS — I611 Nontraumatic intracerebral hemorrhage in hemisphere, cortical: Secondary | ICD-10-CM

## 2019-12-30 DIAGNOSIS — E782 Mixed hyperlipidemia: Secondary | ICD-10-CM

## 2019-12-30 DIAGNOSIS — F32A Depression, unspecified: Secondary | ICD-10-CM

## 2019-12-30 MED ORDER — ALPRAZOLAM 0.5 MG PO TABS: 0.5000 mg | ORAL_TABLET | Freq: Three times a day (TID) | ORAL | 2 refills | Status: DC | PRN

## 2019-12-30 MED ORDER — OLANZAPINE 7.5 MG PO TABS: 7.5000 mg | ORAL_TABLET | Freq: Every day | ORAL | 1 refills | Status: DC

## 2019-12-30 MED ORDER — LEVOTHYROXINE SODIUM 75 MCG PO TABS: 75.0000 ug | ORAL_TABLET | Freq: Every day | ORAL | 1 refills | Status: DC

## 2019-12-30 MED ORDER — METOPROLOL SUCCINATE ER 25 MG PO TB24: 25.0000 mg | ORAL_TABLET | Freq: Every day | ORAL | 3 refills | Status: DC

## 2019-12-30 NOTE — Progress Notes (Signed)
Virtual Visit via telephone Note  I connected with Wendy Collier on 12/30/19 at 1412 by telephone and verified that I am speaking with the correct person using two identifiers. Wendy Collier is currently located at home and husband are currently with her during visit. The provider, Elige Radon Rodderick Holtzer, MD is located in their office at time of visit.  Call ended at 1428  I discussed the limitations, risks, security and privacy concerns of performing an evaluation and management service by telephone and the availability of in person appointments. I also discussed with the patient that there may be a patient responsible charge related to this service. The patient expressed understanding and agreed to proceed.   History and Present Illness: Hypertension Patient is currently on benazepril and hctz, and their blood pressure today is 130/95. Patient denies any lightheadedness or dizziness. Patient denies headaches, blurred vision, chest pains, shortness of breath, or weakness. Denies any side effects from medication and is content with current medication.   Hypothyroidism recheck Patient is coming in for thyroid recheck today as well. They deny any issues with hair changes or heat or cold problems or diarrhea or constipation. They deny any chest pain or palpitations. They are currently on levothyroxine   Anxiety and depression Patient is not currently not seeing psychiatrist and is in between doctors. She is currently doing well on Zyprexa and cymbalta and xanax and is doing well.  She has a little anxiety and no depression and sleeping well.  Current rx- xanax 0.5 tid prn # meds rx- 90 Effectiveness of current meds-works well, often only uses bid Adverse reactions form meds-none  Pill count performed-No Last drug screen -3 31,2021 ( high risk q11m, moderate risk q41m, low risk yearly ) Urine drug screen today- No Was the NCCSR reviewed-yes  If yes were their any concerning findings?  -None  No flowsheet data found.   Controlled substance contract signed on: 09/23/2019  1. Essential hypertension   2. Acquired hypothyroidism   3. Mixed hyperlipidemia   4. Anxiety and depression   5. 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 Insomnia, unspecified type   8. Controlled substance agreement signed   9. Benzodiazepine dependence [facility])     Outpatient Encounter Medications as of 12/30/2019  Medication Sig  . ALPRAZolam (XANAX) 0.5 MG tablet Take 1 tablet (0.5 mg total) by mouth 3 (three) times daily as needed for anxiety. Put on file until patient request  . benazepril (LOTENSIN) 10 MG tablet Take 3 tablets (30 mg total) by mouth daily.  . DULoxetine (CYMBALTA) 30 MG capsule Take 2 capsules (60 mg total) by mouth daily. With two 20 mg capsules  . hydrochlorothiazide (HYDRODIURIL) 25 MG tablet Take 1 tablet (25 mg total) by mouth daily.  Marland Kitchen levothyroxine (SYNTHROID) 75 MCG tablet Take 1 tablet (75 mcg total) by mouth daily before breakfast.  . metoprolol succinate (TOPROL-XL) 25 MG 24 hr tablet Take 1 tablet (25 mg total) by mouth daily.  . Multiple Vitamins-Minerals (MULTIVITAMIN WITH MINERALS) tablet Take 1 tablet by mouth daily.  Marland Kitchen OLANZapine (ZYPREXA) 7.5 MG tablet Take 1 tablet (7.5 mg total) by mouth at bedtime.  . pantoprazole (PROTONIX) 40 MG tablet Take 1 tablet (40 mg total) by mouth at bedtime.  . rosuvastatin (CRESTOR) 10 MG tablet   . SENNA S 8.6-50 MG tablet TAKE (2) TABLETS DAILY AT BEDTIME.  Marland Kitchen tiZANidine (ZANAFLEX) 4  MG tablet Take 1 tablet (4 mg total) by mouth 3 (three) times daily.  . [DISCONTINUED] ALPRAZolam (XANAX) 0.5 MG tablet Take 1 tablet (0.5 mg total) by mouth 3 (three) times daily as needed for anxiety.  . [DISCONTINUED] levothyroxine (SYNTHROID) 75 MCG tablet Take 1 tablet (75 mcg total) by mouth daily before  breakfast.  . [DISCONTINUED] metoprolol succinate (TOPROL-XL) 25 MG 24 hr tablet Take 1 tablet (25 mg total) by mouth daily.  . [DISCONTINUED] OLANZapine (ZYPREXA) 7.5 MG tablet Take 1 tablet (7.5 mg total) by mouth at bedtime.   No facility-administered encounter medications on file as of 12/30/2019.    Review of Systems  Constitutional: Negative for chills and fever.  Eyes: Negative for visual disturbance.  Respiratory: Negative for chest tightness and shortness of breath.   Cardiovascular: Negative for chest pain and leg swelling.  Musculoskeletal: Negative for back pain and gait problem.  Skin: Negative for rash.  Neurological: Negative for light-headedness and headaches.  Psychiatric/Behavioral: Negative for agitation, behavioral problems, dysphoric mood, self-injury, sleep disturbance and suicidal ideas. The patient is nervous/anxious.   All other systems reviewed and are negative.   Observations/Objective: Patient sounds comfortable and in no acute distress  Assessment and Plan: Problem List Items Addressed This Visit      Cardiovascular and Mediastinum   Essential hypertension - Primary   Relevant Medications   metoprolol succinate (TOPROL-XL) 25 MG 24 hr tablet     Endocrine   Hypothyroidism   Relevant Medications   levothyroxine (SYNTHROID) 75 MCG tablet   metoprolol succinate (TOPROL-XL) 25 MG 24 hr tablet   Other Relevant Orders   TSH     Nervous and Auditory   ICH (intracerebral hemorrhage) (HCC)   Relevant Medications   metoprolol succinate (TOPROL-XL) 25 MG 24 hr tablet     Other   Anxiety and depression   Relevant Medications   OLANZapine (ZYPREXA) 7.5 MG tablet   ALPRAZolam (XANAX) 0.5 MG tablet   Other Relevant Orders   CBC with Differential/Platelet   Controlled substance agreement signed   Relevant Medications   ALPRAZolam (XANAX) 0.5 MG tablet   Benzodiazepine dependence (HCC)   Relevant Medications   ALPRAZolam (XANAX) 0.5 MG tablet    Hyperlipemia   Relevant Medications   metoprolol succinate (TOPROL-XL) 25 MG 24 hr tablet   Other Relevant Orders   Lipid panel    Other Visit Diagnoses    Insomnia, unspecified type       Relevant Medications   OLANZapine (ZYPREXA) 7.5 MG tablet   ALPRAZolam (XANAX) 0.5 MG tablet      Continue current medication, she will continue to work on getting back in with a psychiatrist for her bipolar.  We will see her in person for her next visit. Follow up plan: Return in about 3 months (around 03/31/2020), or if symptoms worsen or fail to improve, for anxiety and thyroid.     I discussed the assessment and treatment plan with the patient. The patient was provided an opportunity to ask questions and all were answered. The patient agreed with the plan and demonstrated an understanding of the instructions.   The patient was advised to call back or seek an in-person evaluation if the symptoms worsen or if the condition fails to improve as anticipated.  The above assessment and management plan was discussed with the patient. The patient verbalized understanding of and has agreed to the management plan. Patient is aware to call the clinic if symptoms persist or worsen. Patient  is aware when to return to the clinic for a follow-up visit. Patient educated on when it is appropriate to go to the emergency department.    I provided 16 minutes of non-face-to-face time during this encounter.    Nils Pyle, MD

## 2020-01-08 ENCOUNTER — Other Ambulatory Visit: Payer: Medicare Other

## 2020-01-08 ENCOUNTER — Other Ambulatory Visit: Payer: Self-pay

## 2020-01-08 DIAGNOSIS — E039 Hypothyroidism, unspecified: Secondary | ICD-10-CM

## 2020-01-08 DIAGNOSIS — F419 Anxiety disorder, unspecified: Secondary | ICD-10-CM

## 2020-01-08 DIAGNOSIS — F32A Depression, unspecified: Secondary | ICD-10-CM

## 2020-01-08 DIAGNOSIS — E782 Mixed hyperlipidemia: Secondary | ICD-10-CM

## 2020-01-09 LAB — CBC WITH DIFFERENTIAL/PLATELET
Basophils Absolute: 0 10*3/uL (ref 0.0–0.2)
Basos: 0 %
EOS (ABSOLUTE): 0.3 10*3/uL (ref 0.0–0.4)
Eos: 5 %
Hematocrit: 41.5 % (ref 34.0–46.6)
Hemoglobin: 13.6 g/dL (ref 11.1–15.9)
Immature Grans (Abs): 0 10*3/uL (ref 0.0–0.1)
Immature Granulocytes: 0 %
Lymphocytes Absolute: 2.3 10*3/uL (ref 0.7–3.1)
Lymphs: 42 %
MCH: 29.3 pg (ref 26.6–33.0)
MCHC: 32.8 g/dL (ref 31.5–35.7)
MCV: 89 fL (ref 79–97)
Monocytes Absolute: 0.5 10*3/uL (ref 0.1–0.9)
Monocytes: 9 %
Neutrophils Absolute: 2.5 10*3/uL (ref 1.4–7.0)
Neutrophils: 44 %
Platelets: 232 10*3/uL (ref 150–450)
RBC: 4.64 x10E6/uL (ref 3.77–5.28)
RDW: 13.5 % (ref 11.7–15.4)
WBC: 5.6 10*3/uL (ref 3.4–10.8)

## 2020-01-09 LAB — LIPID PANEL
Chol/HDL Ratio: 4.4 ratio (ref 0.0–4.4)
Cholesterol, Total: 172 mg/dL (ref 100–199)
HDL: 39 mg/dL — ABNORMAL LOW (ref >39)
LDL Chol Calc (NIH): 82 mg/dL (ref 0–99)
Triglycerides: 314 mg/dL — ABNORMAL HIGH (ref 0–149)
VLDL Cholesterol Cal: 51 mg/dL — ABNORMAL HIGH (ref 5–40)

## 2020-01-09 LAB — TSH: TSH: 0.527 u[IU]/mL (ref 0.450–4.500)

## 2020-01-11 ENCOUNTER — Other Ambulatory Visit: Payer: Self-pay | Admitting: Family Medicine

## 2020-01-11 NOTE — Telephone Encounter (Signed)
  Prescription Request  01/11/2020  What is the name of the medication or equipment? Rosuvastatin 10 mg Patient is out  Have you contacted your pharmacy to request a refill? (if applicable) YES  Which pharmacy would you like this sent to? [facility] in [facility]    Patient notified that their request is being sent to the clinical staff for review and that they should receive a response within 2 business days.

## 2020-01-12 MED ORDER — ROSUVASTATIN CALCIUM 10 MG PO TABS: 10.0000 mg | ORAL_TABLET | Freq: Every day | ORAL | 0 refills | Status: DC

## 2020-02-09 ENCOUNTER — Other Ambulatory Visit: Payer: Self-pay | Admitting: Family Medicine

## 2020-02-11 ENCOUNTER — Other Ambulatory Visit: Payer: Self-pay | Admitting: Family Medicine

## 2020-02-21 ENCOUNTER — Other Ambulatory Visit: Payer: Self-pay | Admitting: Family Medicine

## 2020-03-24 ENCOUNTER — Other Ambulatory Visit: Payer: Self-pay | Admitting: Family Medicine

## 2020-03-24 DIAGNOSIS — I10 Essential (primary) hypertension: Secondary | ICD-10-CM

## 2020-03-30 ENCOUNTER — Telehealth: Payer: Self-pay

## 2020-03-30 DIAGNOSIS — I10 Essential (primary) hypertension: Secondary | ICD-10-CM

## 2020-03-30 DIAGNOSIS — I619 Nontraumatic intracerebral hemorrhage, unspecified: Secondary | ICD-10-CM

## 2020-03-30 DIAGNOSIS — I611 Nontraumatic intracerebral hemorrhage in hemisphere, cortical: Secondary | ICD-10-CM

## 2020-03-30 MED ORDER — METOPROLOL SUCCINATE ER 25 MG PO TB24: 25.0000 mg | ORAL_TABLET | Freq: Every day | ORAL | 0 refills | Status: DC

## 2020-03-30 MED ORDER — BENAZEPRIL HCL 10 MG PO TABS: 30.0000 mg | ORAL_TABLET | Freq: Every day | ORAL | 0 refills | Status: DC

## 2020-03-30 NOTE — Telephone Encounter (Signed)
  Prescription Request  03/30/2020  What is the name of the medication or equipment? metoprolol succinate (TOPROL-XL) 25 MG 24 hr tablet benazepril (LOTENSIN) 10 MG tablet   Have you contacted your pharmacy to request a refill? (if applicable) yes, pt has appt with Dr. Algis Downs on 10/25  Which pharmacy would you like this sent to? [facility] [place]   Patient notified that their request is being sent to the clinical staff for review and that they should receive a response within 2 business days.

## 2020-03-30 NOTE — Telephone Encounter (Signed)
sent 

## 2020-04-11 ENCOUNTER — Other Ambulatory Visit: Payer: Self-pay | Admitting: Family Medicine

## 2020-04-11 DIAGNOSIS — I10 Essential (primary) hypertension: Secondary | ICD-10-CM

## 2020-04-18 ENCOUNTER — Other Ambulatory Visit: Payer: Self-pay | Admitting: Family Medicine

## 2020-04-18 ENCOUNTER — Other Ambulatory Visit: Payer: Self-pay

## 2020-04-18 ENCOUNTER — Encounter: Payer: Self-pay | Admitting: Family Medicine

## 2020-04-18 ENCOUNTER — Ambulatory Visit (INDEPENDENT_AMBULATORY_CARE_PROVIDER_SITE_OTHER): Payer: Medicare Other | Admitting: Family Medicine

## 2020-04-18 VITALS — BP 120/83 | HR 87 | Temp 97.0°F | Ht 66.0 in | Wt 175.0 lb

## 2020-04-18 DIAGNOSIS — I10 Essential (primary) hypertension: Secondary | ICD-10-CM

## 2020-04-18 DIAGNOSIS — I611 Nontraumatic intracerebral hemorrhage in hemisphere, cortical: Secondary | ICD-10-CM

## 2020-04-18 DIAGNOSIS — F1320 Sedative, hypnotic or anxiolytic dependence, uncomplicated: Secondary | ICD-10-CM

## 2020-04-18 DIAGNOSIS — E782 Mixed hyperlipidemia: Secondary | ICD-10-CM

## 2020-04-18 DIAGNOSIS — F32A Depression, unspecified: Secondary | ICD-10-CM

## 2020-04-18 DIAGNOSIS — E039 Hypothyroidism, unspecified: Secondary | ICD-10-CM

## 2020-04-18 DIAGNOSIS — I619 Nontraumatic intracerebral hemorrhage, unspecified: Secondary | ICD-10-CM

## 2020-04-18 DIAGNOSIS — F419 Anxiety disorder, unspecified: Secondary | ICD-10-CM

## 2020-04-18 DIAGNOSIS — Z79899 Other long term (current) drug therapy: Secondary | ICD-10-CM

## 2020-04-18 DIAGNOSIS — G4700 Insomnia, unspecified: Secondary | ICD-10-CM

## 2020-04-18 MED ORDER — ROSUVASTATIN CALCIUM 10 MG PO TABS
10.0000 mg | ORAL_TABLET | Freq: Every day | ORAL | 0 refills | Status: DC
Start: 2020-04-18 — End: 2020-05-03

## 2020-04-18 MED ORDER — METOPROLOL SUCCINATE ER 25 MG PO TB24: 25.0000 mg | ORAL_TABLET | Freq: Every day | ORAL | 3 refills | Status: DC

## 2020-04-18 MED ORDER — BENAZEPRIL HCL 10 MG PO TABS: 30.0000 mg | ORAL_TABLET | Freq: Every day | ORAL | 3 refills | Status: DC

## 2020-04-18 MED ORDER — HYDROCHLOROTHIAZIDE 25 MG PO TABS: 25.0000 mg | ORAL_TABLET | Freq: Every day | ORAL | 3 refills | Status: DC

## 2020-04-18 MED ORDER — ALPRAZOLAM 0.5 MG PO TABS: 0.5000 mg | ORAL_TABLET | Freq: Two times a day (BID) | ORAL | 2 refills | Status: DC | PRN

## 2020-04-18 NOTE — Progress Notes (Signed)
BP 120/83   Pulse 87   Temp (!) 97 F (36.1 C)   Ht '5\' 6"'  (1.676 m)   Wt 175 lb (79.4 kg)   LMP 08/28/2016 (Approximate) Comment: Pt reports being unknown when she had her last period, but had spotting two days ago.   SpO2 99%   BMI 28.25 kg/m    Subjective:   Patient ID: Wendy Collier, female    DOB: Sep 09, 1965, 54 y.o.   MRN: [id number]  HPI: Wendy Collier is a 54 y.o. female presenting on 04/18/2020 for Medical Management of Chronic Issues, Hypertension, and Hypothyroidism   HPI Hypertension Patient is currently on benazepril and hydrochlorothiazide and metoprolol, and their blood pressure today is 120/83. Patient denies any lightheadedness or dizziness. Patient denies headaches, blurred vision, chest pains, shortness of breath, or weakness. Denies any side effects from medication and is content with current medication.   Hypothyroidism recheck Patient is coming in for thyroid recheck today as well. They deny any issues with hair changes or heat or cold problems or diarrhea or constipation. They deny any chest pain or palpitations. They are currently on levothyroxine 75 micrograms   Hyperlipidemia Patient is coming in for recheck of his hyperlipidemia. The patient is currently taking Crestor. They deny any issues with myalgias or history of liver damage from it. They deny any focal numbness or weakness or chest pain.   Anxiety  Current rx-Xanax 0.5 mg twice daily as needed, she was getting 3 times daily but she says she is only taking it twice daily so will lower the prescription amount. # meds rx-60 Effectiveness of current meds-works well Adverse reactions form meds-none currently, warned of interactions with hydrocodone which she gets from her pain doctor.  Pill count performed-No Last drug screen -09/23/2019 ( high risk q34m moderate risk q684mlow risk yearly ) Urine drug screen today- No Was the N[place]uper[place]oreviewed-yes  If yes were their any concerning findings? -None except  we discussed the hydrocodone and the interaction between the 2.  No flowsheet data found.   Controlled substance contract signed on: 09/23/2019  Relevant past medical, surgical, family and social history reviewed and updated as indicated. Interim medical history since our last visit reviewed. Allergies and medications reviewed and updated.  Review of Systems  Constitutional: Negative for chills and fever.  Eyes: Negative for redness and visual disturbance.  Respiratory: Negative for chest tightness and shortness of breath.   Cardiovascular: Negative for chest pain and leg swelling.  Genitourinary: Negative for difficulty urinating and dysuria.  Musculoskeletal: Positive for back pain. Negative for gait problem.  Skin: Negative for rash.  Neurological: Negative for light-headedness and headaches.  Psychiatric/Behavioral: Positive for sleep disturbance. Negative for agitation, behavioral problems, self-injury and suicidal ideas. The patient is nervous/anxious.   All other systems reviewed and are negative.   Per HPI unless specifically indicated above   Allergies as of 04/18/2020      Reactions   Silver Rash   Tegaderm Tegaderm Tegaderm Tegaderm Tegaderm Tegaderm      Medication List       Accurate as of April 18, 2020 12:03 PM. If you have any questions, ask your nurse or doctor.        ALPRAZolam 0.5 MG tablet Commonly known as: XANAX Take 1 tablet (0.5 mg total) by mouth 2 (two) times daily as needed for anxiety. Put on file until patient request What changed: when to take this Changed by: JoWorthy RancherMD   benazepril 10  MG tablet Commonly known as: LOTENSIN Take 3 tablets (30 mg total) by mouth daily.   DULoxetine 30 MG capsule Commonly known as: CYMBALTA Take 2 capsules (60 mg total) by mouth daily. With two 20 mg capsules   hydrochlorothiazide 25 MG tablet Commonly known as: HYDRODIURIL Take 1 tablet (25 mg total) by mouth daily.    HYDROcodone-acetaminophen 7.5-325 MG tablet Commonly known as: NORCO Take 1 tablet by mouth 4 (four) times daily as needed.   levothyroxine 75 MCG tablet Commonly known as: SYNTHROID Take 1 tablet (75 mcg total) by mouth daily before breakfast.   metoprolol succinate 25 MG 24 hr tablet Commonly known as: TOPROL-XL Take 1 tablet (25 mg total) by mouth daily.   multivitamin with minerals tablet Take 1 tablet by mouth daily.   OLANZapine 7.5 MG tablet Commonly known as: ZyPREXA Take 1 tablet (7.5 mg total) by mouth at bedtime.   pantoprazole 40 MG tablet Commonly known as: PROTONIX Take 1 tablet (40 mg total) by mouth at bedtime.   rosuvastatin 10 MG tablet Commonly known as: CRESTOR Take 1 tablet (10 mg total) by mouth daily.   Senna S 8.6-50 MG tablet Generic drug: senna-docusate TAKE (2) TABLETS DAILY AT BEDTIME.   tiZANidine 4 MG tablet Commonly known as: ZANAFLEX TAKE 1 TABLET BY MOUTH THREE TIMES DAILY        Objective:   BP 120/83   Pulse 87   Temp (!) 97 F (36.1 C)   Ht '5\' 6"'  (1.676 m)   Wt 175 lb (79.4 kg)   LMP 08/28/2016 (Approximate) Comment: Pt reports being unknown when she had her last period, but had spotting two days ago.   SpO2 99%   BMI 28.25 kg/m   Wt Readings from Last 3 Encounters:  04/18/20 175 lb (79.4 kg)  09/28/19 171 lb 2 oz (77.6 kg)  09/17/19 172 lb 3.2 oz (78.1 kg)    Physical Exam Vitals and nursing note reviewed.  Constitutional:      General: She is not in acute distress.    Appearance: She is well-developed. She is not diaphoretic.  Eyes:     Conjunctiva/sclera: Conjunctivae normal.  Cardiovascular:     Rate and Rhythm: Normal rate and regular rhythm.     Heart sounds: Normal heart sounds. No murmur heard.   Pulmonary:     Effort: Pulmonary effort is normal. No respiratory distress.     Breath sounds: Normal breath sounds. No wheezing.  Skin:    General: Skin is warm and dry.     Findings: No rash.   Neurological:     Mental Status: She is alert and oriented to person, place, and time.     Coordination: Coordination normal.  Psychiatric:        Behavior: Behavior normal.       Assessment & Plan:   Problem List Items Addressed This Visit      Cardiovascular and Mediastinum   Essential hypertension   Relevant Medications   rosuvastatin (CRESTOR) 10 MG tablet   metoprolol succinate (TOPROL-XL) 25 MG 24 hr tablet   hydrochlorothiazide (HYDRODIURIL) 25 MG tablet   benazepril (LOTENSIN) 10 MG tablet   Other Relevant Orders   CBC with Differential/Platelet   CMP14+EGFR     Endocrine   Hypothyroidism - Primary   Relevant Medications   metoprolol succinate (TOPROL-XL) 25 MG 24 hr tablet   Other Relevant Orders   CBC with Differential/Platelet   CMP14+EGFR   TSH  Nervous and Auditory   ICH (intracerebral hemorrhage) (HCC)   Relevant Medications   metoprolol succinate (TOPROL-XL) 25 MG 24 hr tablet     Other   Anxiety and depression   Relevant Medications   ALPRAZolam (XANAX) 0.5 MG tablet   Controlled substance agreement signed   Relevant Medications   ALPRAZolam (XANAX) 0.5 MG tablet   Benzodiazepine dependence (HCC)   Relevant Medications   ALPRAZolam (XANAX) 0.5 MG tablet   Hyperlipemia   Relevant Medications   rosuvastatin (CRESTOR) 10 MG tablet   metoprolol succinate (TOPROL-XL) 25 MG 24 hr tablet   hydrochlorothiazide (HYDRODIURIL) 25 MG tablet   benazepril (LOTENSIN) 10 MG tablet   Other Relevant Orders   Lipid panel    Other Visit Diagnoses    Insomnia, unspecified type       Relevant Medications   ALPRAZolam (XANAX) 0.5 MG tablet      Continue current medication, will continue with the alprazolam, she does have the hydrocodone as well and warned about the interaction. Follow up plan: Return in about 3 months (around 07/19/2020), or if symptoms worsen or fail to improve, for Thyroid and anxiety recheck with me, 1 to 2-week appointment with [facility]  for cholesterol.  Counseling provided for all of the vaccine components Orders Placed This Encounter  Procedures  . CBC with Differential/Platelet  . CMP14+EGFR  . Lipid panel  . TSH    Caryl Pina, MD [facility] 04/18/2020, 12:03 PM

## 2020-04-19 LAB — CMP14+EGFR
ALT: 46 IU/L — ABNORMAL HIGH (ref 0–32)
AST: 44 IU/L — ABNORMAL HIGH (ref 0–40)
Albumin/Globulin Ratio: 2.5 — ABNORMAL HIGH (ref 1.2–2.2)
Albumin: 4.9 g/dL (ref 3.8–4.9)
Alkaline Phosphatase: 113 IU/L (ref 44–121)
BUN/Creatinine Ratio: 20 (ref 9–23)
BUN: 17 mg/dL (ref 6–24)
Bilirubin Total: 0.3 mg/dL (ref 0.0–1.2)
CO2: 25 mmol/L (ref 20–29)
Calcium: 10 mg/dL (ref 8.7–10.2)
Chloride: 101 mmol/L (ref 96–106)
Creatinine, Ser: 0.85 mg/dL (ref 0.57–1.00)
GFR calc Af Amer: 91 mL/min/{1.73_m2} (ref >59)
GFR calc non Af Amer: 79 mL/min/{1.73_m2} (ref >59)
Globulin, Total: 2 g/dL (ref 1.5–4.5)
Glucose: 133 mg/dL — ABNORMAL HIGH (ref 65–99)
Potassium: 4.3 mmol/L (ref 3.5–5.2)
Sodium: 139 mmol/L (ref 134–144)
Total Protein: 6.9 g/dL (ref 6.0–8.5)

## 2020-04-19 LAB — CBC WITH DIFFERENTIAL/PLATELET
Basophils Absolute: 0 10*3/uL (ref 0.0–0.2)
Basos: 0 %
EOS (ABSOLUTE): 0.2 10*3/uL (ref 0.0–0.4)
Eos: 5 %
Hematocrit: 42.2 % (ref 34.0–46.6)
Hemoglobin: 14 g/dL (ref 11.1–15.9)
Immature Grans (Abs): 0 10*3/uL (ref 0.0–0.1)
Immature Granulocytes: 0 %
Lymphocytes Absolute: 1.4 10*3/uL (ref 0.7–3.1)
Lymphs: 28 %
MCH: 29.2 pg (ref 26.6–33.0)
MCHC: 33.2 g/dL (ref 31.5–35.7)
MCV: 88 fL (ref 79–97)
Monocytes Absolute: 0.4 10*3/uL (ref 0.1–0.9)
Monocytes: 8 %
Neutrophils Absolute: 3 10*3/uL (ref 1.4–7.0)
Neutrophils: 59 %
Platelets: 217 10*3/uL (ref 150–450)
RBC: 4.79 x10E6/uL (ref 3.77–5.28)
RDW: 12.8 % (ref 11.7–15.4)
WBC: 5 10*3/uL (ref 3.4–10.8)

## 2020-04-19 LAB — TSH: TSH: 0.124 u[IU]/mL — ABNORMAL LOW (ref 0.450–4.500)

## 2020-04-19 LAB — LIPID PANEL
Chol/HDL Ratio: 4.1 ratio (ref 0.0–4.4)
Cholesterol, Total: 158 mg/dL (ref 100–199)
HDL: 39 mg/dL — ABNORMAL LOW (ref >39)
LDL Chol Calc (NIH): 66 mg/dL (ref 0–99)
Triglycerides: 338 mg/dL — ABNORMAL HIGH (ref 0–149)
VLDL Cholesterol Cal: 53 mg/dL — ABNORMAL HIGH (ref 5–40)

## 2020-05-02 ENCOUNTER — Other Ambulatory Visit: Payer: Self-pay | Admitting: Family Medicine

## 2020-05-02 ENCOUNTER — Ambulatory Visit: Payer: Medicare Other | Admitting: Pharmacist

## 2020-05-02 DIAGNOSIS — F32A Depression, unspecified: Secondary | ICD-10-CM

## 2020-05-02 DIAGNOSIS — G4700 Insomnia, unspecified: Secondary | ICD-10-CM

## 2020-05-02 DIAGNOSIS — F1320 Sedative, hypnotic or anxiolytic dependence, uncomplicated: Secondary | ICD-10-CM

## 2020-05-02 DIAGNOSIS — F419 Anxiety disorder, unspecified: Secondary | ICD-10-CM

## 2020-05-02 DIAGNOSIS — Z79899 Other long term (current) drug therapy: Secondary | ICD-10-CM

## 2020-05-03 ENCOUNTER — Other Ambulatory Visit: Payer: Self-pay | Admitting: Family Medicine

## 2020-05-03 DIAGNOSIS — Z79899 Other long term (current) drug therapy: Secondary | ICD-10-CM

## 2020-05-03 DIAGNOSIS — G4700 Insomnia, unspecified: Secondary | ICD-10-CM

## 2020-05-03 DIAGNOSIS — F32A Depression, unspecified: Secondary | ICD-10-CM

## 2020-05-03 DIAGNOSIS — I611 Nontraumatic intracerebral hemorrhage in hemisphere, cortical: Secondary | ICD-10-CM

## 2020-05-03 DIAGNOSIS — I619 Nontraumatic intracerebral hemorrhage, unspecified: Secondary | ICD-10-CM

## 2020-05-03 DIAGNOSIS — F1320 Sedative, hypnotic or anxiolytic dependence, uncomplicated: Secondary | ICD-10-CM

## 2020-05-03 NOTE — Telephone Encounter (Signed)
Xanax rf denied - ntbs for controlled med  Other 2 were sent in today

## 2020-06-07 ENCOUNTER — Other Ambulatory Visit: Payer: Self-pay | Admitting: Family Medicine

## 2020-06-07 DIAGNOSIS — G4700 Insomnia, unspecified: Secondary | ICD-10-CM

## 2020-06-07 DIAGNOSIS — F32A Depression, unspecified: Secondary | ICD-10-CM

## 2020-06-07 DIAGNOSIS — F1320 Sedative, hypnotic or anxiolytic dependence, uncomplicated: Secondary | ICD-10-CM

## 2020-06-07 DIAGNOSIS — Z79899 Other long term (current) drug therapy: Secondary | ICD-10-CM

## 2020-06-28 ENCOUNTER — Other Ambulatory Visit: Payer: Self-pay | Admitting: Family Medicine

## 2020-07-15 ENCOUNTER — Other Ambulatory Visit: Payer: Self-pay | Admitting: Family Medicine

## 2020-07-15 DIAGNOSIS — I10 Essential (primary) hypertension: Secondary | ICD-10-CM

## 2020-07-18 ENCOUNTER — Other Ambulatory Visit: Payer: Self-pay | Admitting: Family Medicine

## 2020-07-18 DIAGNOSIS — I10 Essential (primary) hypertension: Secondary | ICD-10-CM

## 2020-07-20 ENCOUNTER — Ambulatory Visit: Payer: Medicare Other | Admitting: Family Medicine

## 2020-07-30 ENCOUNTER — Other Ambulatory Visit: Payer: Self-pay | Admitting: Family Medicine

## 2020-07-30 DIAGNOSIS — F1320 Sedative, hypnotic or anxiolytic dependence, uncomplicated: Secondary | ICD-10-CM

## 2020-07-30 DIAGNOSIS — F32A Depression, unspecified: Secondary | ICD-10-CM

## 2020-07-30 DIAGNOSIS — F419 Anxiety disorder, unspecified: Secondary | ICD-10-CM

## 2020-07-30 DIAGNOSIS — Z79899 Other long term (current) drug therapy: Secondary | ICD-10-CM

## 2020-07-30 DIAGNOSIS — G4700 Insomnia, unspecified: Secondary | ICD-10-CM

## 2020-08-13 ENCOUNTER — Other Ambulatory Visit: Payer: Self-pay | Admitting: Family Medicine

## 2020-08-13 DIAGNOSIS — F1320 Sedative, hypnotic or anxiolytic dependence, uncomplicated: Secondary | ICD-10-CM

## 2020-08-13 DIAGNOSIS — Z79899 Other long term (current) drug therapy: Secondary | ICD-10-CM

## 2020-08-13 DIAGNOSIS — G4700 Insomnia, unspecified: Secondary | ICD-10-CM

## 2020-08-13 DIAGNOSIS — F32A Depression, unspecified: Secondary | ICD-10-CM

## 2020-08-24 ENCOUNTER — Other Ambulatory Visit: Payer: Self-pay

## 2020-08-24 ENCOUNTER — Ambulatory Visit (INDEPENDENT_AMBULATORY_CARE_PROVIDER_SITE_OTHER): Payer: Medicare Other | Admitting: Family Medicine

## 2020-08-24 ENCOUNTER — Encounter: Payer: Self-pay | Admitting: Family Medicine

## 2020-08-24 VITALS — BP 120/77 | HR 81 | Ht 66.0 in | Wt 175.0 lb

## 2020-08-24 DIAGNOSIS — F32A Depression, unspecified: Secondary | ICD-10-CM | POA: Diagnosis not present

## 2020-08-24 DIAGNOSIS — F419 Anxiety disorder, unspecified: Secondary | ICD-10-CM

## 2020-08-24 DIAGNOSIS — G4700 Insomnia, unspecified: Secondary | ICD-10-CM

## 2020-08-24 DIAGNOSIS — E782 Mixed hyperlipidemia: Secondary | ICD-10-CM | POA: Diagnosis not present

## 2020-08-24 DIAGNOSIS — I10 Essential (primary) hypertension: Secondary | ICD-10-CM

## 2020-08-24 DIAGNOSIS — Z79899 Other long term (current) drug therapy: Secondary | ICD-10-CM

## 2020-08-24 DIAGNOSIS — E039 Hypothyroidism, unspecified: Secondary | ICD-10-CM

## 2020-08-24 DIAGNOSIS — F1320 Sedative, hypnotic or anxiolytic dependence, uncomplicated: Secondary | ICD-10-CM

## 2020-08-24 MED ORDER — OLANZAPINE 7.5 MG PO TABS: 7.5000 mg | ORAL_TABLET | Freq: Every day | ORAL | 1 refills | Status: DC

## 2020-08-24 MED ORDER — PANTOPRAZOLE SODIUM 40 MG PO TBEC
40.0000 mg | DELAYED_RELEASE_TABLET | Freq: Every day | ORAL | 3 refills | Status: DC
Start: 2020-08-24 — End: 2021-08-15

## 2020-08-24 MED ORDER — DULOXETINE HCL 30 MG PO CPEP: 60.0000 mg | ORAL_CAPSULE | Freq: Every day | ORAL | 3 refills | Status: DC

## 2020-08-24 MED ORDER — LEVOTHYROXINE SODIUM 75 MCG PO TABS: 75.0000 ug | ORAL_TABLET | Freq: Every day | ORAL | 1 refills | Status: DC

## 2020-08-24 MED ORDER — HYDROCHLOROTHIAZIDE 25 MG PO TABS: 25.0000 mg | ORAL_TABLET | Freq: Every day | ORAL | 3 refills | Status: DC

## 2020-08-24 MED ORDER — ALPRAZOLAM 0.5 MG PO TABS: 0.5000 mg | ORAL_TABLET | Freq: Two times a day (BID) | ORAL | 2 refills | Status: DC | PRN

## 2020-08-24 MED ORDER — ROSUVASTATIN CALCIUM 10 MG PO TABS: 10.0000 mg | ORAL_TABLET | Freq: Every day | ORAL | 3 refills | Status: DC

## 2020-08-24 NOTE — Progress Notes (Signed)
BP 120/77   Pulse 81   Ht 5' 6" (1.676 m)   Wt 175 lb (79.4 kg)   LMP 08/28/2016 (Approximate) Comment: Pt reports being unknown when she had her last period, but had spotting two days ago.   SpO2 98%   BMI 28.25 kg/m    Subjective:   Patient ID: Wendy Collier, female    DOB: Feb 01, 1966, 55 y.o.   MRN: [id number]  HPI: Wendy Collier is a 55 y.o. female presenting on 08/24/2020 for Medical Management of Chronic Issues, Anxiety, and Depression   HPI Anxiety depression and insomnia Patient is coming in for recheck for anxiety depression insomnia.  She is currently taking Cymbalta and Zyprexa and then also takes the alprazolam and has been doing okay with twice a day and not needing it all of the time, and space out for more than 3 months this last time.  She denies any suicidal ideations or thoughts of hurting herself.  She feels like things are going pretty well.  Hypothyroidism recheck Patient is coming in for thyroid recheck today as well. They deny any issues with hair changes or heat or cold problems or diarrhea or constipation. They deny any chest pain or palpitations. They are currently on levothyroxine 71mcrograms   Hypertension Patient is currently on benazepril and metoprolol, and their blood pressure today is 120/77. Patient denies any lightheadedness or dizziness. Patient denies headaches, blurred vision, chest pains, shortness of breath, or weakness. Denies any side effects from medication and is content with current medication.   Hyperlipidemia Patient is coming in for recheck of his hyperlipidemia. The patient is currently taking Crestor. They deny any issues with myalgias or history of liver damage from it. They deny any focal numbness or weakness or chest pain.   Relevant past medical, surgical, family and social history reviewed and updated as indicated. Interim medical history since our last visit reviewed. Allergies and medications reviewed and updated.  Review of  Systems  Constitutional: Negative for chills and fever.  HENT: Negative for congestion, ear discharge and ear pain.   Eyes: Negative for visual disturbance.  Respiratory: Negative for chest tightness and shortness of breath.   Cardiovascular: Negative for chest pain and leg swelling.  Musculoskeletal: Negative for back pain and gait problem.  Skin: Negative for rash.  Neurological: Negative for light-headedness and headaches.  Psychiatric/Behavioral: Negative for agitation and behavioral problems.  All other systems reviewed and are negative.   Per HPI unless specifically indicated above   Allergies as of 08/24/2020      Reactions   Silver Rash   _0  Tegaderm      Medication List       Accurate as of August 24, 2020  2:00 PM. If you have any questions, ask your nurse or doctor.        ALPRAZolam 0.5 MG tablet Commonly known as: XANAX Take 1 tablet (0.5 mg total) by mouth 2 (two) times daily as needed for anxiety. Put on file until patient request   benazepril 10 MG tablet Commonly known as: LOTENSIN Take 3 tablets (30 mg total) by mouth daily.   DULoxetine 30 MG capsule Commonly known as: CYMBALTA Take 2 capsules (60 mg total) by mouth daily. With two 20 mg capsules   hydrochlorothiazide 25 MG tablet Commonly known as: HYDRODIURIL Take 1 tablet by mouth once daily   HYDROcodone-acetaminophen 7.5-325 MG tablet Commonly known as: NORCO Take 1 tablet by mouth 4 (four) times daily as  needed.   levothyroxine 75 MCG tablet Commonly known as: SYNTHROID Take 1 tablet (75 mcg total) by mouth daily before breakfast.   metoprolol succinate 25 MG 24 hr tablet Commonly known as: TOPROL-XL Take 1 tablet by mouth once daily   multivitamin with minerals tablet Take 1 tablet by mouth daily.   OLANZapine 7.5 MG tablet Commonly known as: ZyPREXA Take 1 tablet (7.5 mg total) by mouth at bedtime.   pantoprazole 40 MG  tablet Commonly known as: PROTONIX Take 1 tablet (40 mg total) by mouth at bedtime.   rosuvastatin 10 MG tablet Commonly known as: CRESTOR Take 1 tablet by mouth once daily   Senna S 8.6-50 MG tablet Generic drug: senna-docusate TAKE (2) TABLETS DAILY AT BEDTIME.   tiZANidine 4 MG tablet Commonly known as: ZANAFLEX TAKE 1 TABLET BY MOUTH THREE TIMES DAILY        Objective:   BP 120/77   Pulse 81   Ht 5' 6" (1.676 m)   Wt 175 lb (79.4 kg)   LMP 08/28/2016 (Approximate) Comment: Pt reports being unknown when she had her last period, but had spotting two days ago.   SpO2 98%   BMI 28.25 kg/m   Wt Readings from Last 3 Encounters:  08/24/20 175 lb (79.4 kg)  04/18/20 175 lb (79.4 kg)  09/28/19 171 lb 2 oz (77.6 kg)    Physical Exam Vitals and nursing note reviewed.  Constitutional:      General: She is not in acute distress.    Appearance: She is well-developed and well-nourished. She is not diaphoretic.  Eyes:     Extraocular Movements: EOM normal.     Conjunctiva/sclera: Conjunctivae normal.  Cardiovascular:     Rate and Rhythm: Normal rate and regular rhythm.     Pulses: Intact distal pulses.     Heart sounds: Normal heart sounds. No murmur heard.   Pulmonary:     Effort: Pulmonary effort is normal. No respiratory distress.     Breath sounds: Normal breath sounds. No wheezing.  Musculoskeletal:        General: No tenderness or edema. Normal range of motion.  Skin:    General: Skin is warm and dry.     Findings: No rash.  Neurological:     Mental Status: She is alert and oriented to person, place, and time.     Coordination: Coordination normal.  Psychiatric:        Mood and Affect: Mood and affect normal.        Behavior: Behavior normal.       Assessment & Plan:   Problem List Items Addressed This Visit      Cardiovascular and Mediastinum   Essential hypertension   Relevant Medications   hydrochlorothiazide (HYDRODIURIL) 25 MG tablet    rosuvastatin (CRESTOR) 10 MG tablet     Endocrine   Hypothyroidism   Relevant Medications   levothyroxine (SYNTHROID) 75 MCG tablet   Other Relevant Orders   TSH     Other   Anxiety and depression - Primary   Relevant Medications   DULoxetine (CYMBALTA) 30 MG capsule   ALPRAZolam (XANAX) 0.5 MG tablet   OLANZapine (ZYPREXA) 7.5 MG tablet   Other Relevant Orders   CBC with Differential/Platelet   CMP14+EGFR   ToxASSURE Select 13 (MW), Urine   Hyperlipemia   Relevant Medications   hydrochlorothiazide (HYDRODIURIL) 25 MG tablet   rosuvastatin (CRESTOR) 10 MG tablet   Other Relevant Orders   Lipid panel  Controlled substance agreement signed   Relevant Medications   ALPRAZolam (XANAX) 0.5 MG tablet   Benzodiazepine dependence (HCC)   Relevant Medications   ALPRAZolam (XANAX) 0.5 MG tablet    Other Visit Diagnoses    Insomnia, unspecified type       Relevant Medications   ALPRAZolam (XANAX) 0.5 MG tablet   OLANZapine (ZYPREXA) 7.5 MG tablet      Continue current medication, will check blood work today.  Patient seems to be doing well, no changes. Follow up plan: Return in about 3 months (around 11/24/2020), or if symptoms worsen or fail to improve, for Anxiety depression and thyroid.  Counseling provided for all of the vaccine components No orders of the defined types were placed in this encounter.   Caryl Pina, MD [facility] 08/24/2020, 2:00 PM

## 2020-08-25 LAB — CMP14+EGFR
ALT: 30 IU/L (ref 0–32)
AST: 30 IU/L (ref 0–40)
Albumin/Globulin Ratio: 2.5 — ABNORMAL HIGH (ref 1.2–2.2)
Albumin: 5.3 g/dL — ABNORMAL HIGH (ref 3.8–4.9)
Alkaline Phosphatase: 120 IU/L (ref 44–121)
BUN/Creatinine Ratio: 18 (ref 9–23)
BUN: 15 mg/dL (ref 6–24)
Bilirubin Total: 0.4 mg/dL (ref 0.0–1.2)
CO2: 23 mmol/L (ref 20–29)
Calcium: 10 mg/dL (ref 8.7–10.2)
Chloride: 101 mmol/L (ref 96–106)
Creatinine, Ser: 0.85 mg/dL (ref 0.57–1.00)
Globulin, Total: 2.1 g/dL (ref 1.5–4.5)
Glucose: 93 mg/dL (ref 65–99)
Potassium: 4.5 mmol/L (ref 3.5–5.2)
Sodium: 142 mmol/L (ref 134–144)
Total Protein: 7.4 g/dL (ref 6.0–8.5)
eGFR: 82 mL/min/{1.73_m2} (ref >59)

## 2020-08-25 LAB — CBC WITH DIFFERENTIAL/PLATELET
Basophils Absolute: 0 10*3/uL (ref 0.0–0.2)
Basos: 0 %
EOS (ABSOLUTE): 0.2 10*3/uL (ref 0.0–0.4)
Eos: 3 %
Hematocrit: 40.8 % (ref 34.0–46.6)
Hemoglobin: 13.9 g/dL (ref 11.1–15.9)
Immature Grans (Abs): 0 10*3/uL (ref 0.0–0.1)
Immature Granulocytes: 0 %
Lymphocytes Absolute: 2.4 10*3/uL (ref 0.7–3.1)
Lymphs: 37 %
MCH: 29.6 pg (ref 26.6–33.0)
MCHC: 34.1 g/dL (ref 31.5–35.7)
MCV: 87 fL (ref 79–97)
Monocytes Absolute: 0.5 10*3/uL (ref 0.1–0.9)
Monocytes: 8 %
Neutrophils Absolute: 3.3 10*3/uL (ref 1.4–7.0)
Neutrophils: 52 %
Platelets: 225 10*3/uL (ref 150–450)
RBC: 4.7 x10E6/uL (ref 3.77–5.28)
RDW: 12.8 % (ref 11.7–15.4)
WBC: 6.4 10*3/uL (ref 3.4–10.8)

## 2020-08-25 LAB — TSH: TSH: 0.851 u[IU]/mL (ref 0.450–4.500)

## 2020-08-25 LAB — LIPID PANEL
Chol/HDL Ratio: 3.5 ratio (ref 0.0–4.4)
Cholesterol, Total: 138 mg/dL (ref 100–199)
HDL: 39 mg/dL — ABNORMAL LOW (ref >39)
LDL Chol Calc (NIH): 55 mg/dL (ref 0–99)
Triglycerides: 285 mg/dL — ABNORMAL HIGH (ref 0–149)
VLDL Cholesterol Cal: 44 mg/dL — ABNORMAL HIGH (ref 5–40)

## 2020-08-31 ENCOUNTER — Encounter: Payer: Self-pay | Admitting: *Deleted

## 2020-09-01 LAB — TOXASSURE SELECT 13 (MW), URINE

## 2020-09-13 ENCOUNTER — Other Ambulatory Visit: Payer: Self-pay | Admitting: Family Medicine

## 2020-09-13 DIAGNOSIS — F32A Depression, unspecified: Secondary | ICD-10-CM

## 2020-09-13 DIAGNOSIS — F419 Anxiety disorder, unspecified: Secondary | ICD-10-CM

## 2020-09-13 DIAGNOSIS — M47812 Spondylosis without myelopathy or radiculopathy, cervical region: Secondary | ICD-10-CM

## 2020-09-13 DIAGNOSIS — G4700 Insomnia, unspecified: Secondary | ICD-10-CM

## 2020-09-15 ENCOUNTER — Other Ambulatory Visit: Payer: Self-pay | Admitting: Family Medicine

## 2020-09-15 DIAGNOSIS — F419 Anxiety disorder, unspecified: Secondary | ICD-10-CM

## 2020-09-15 DIAGNOSIS — F32A Depression, unspecified: Secondary | ICD-10-CM

## 2020-09-15 DIAGNOSIS — G4700 Insomnia, unspecified: Secondary | ICD-10-CM

## 2020-09-29 ENCOUNTER — Other Ambulatory Visit: Payer: Self-pay | Admitting: Family Medicine

## 2020-09-29 DIAGNOSIS — G4700 Insomnia, unspecified: Secondary | ICD-10-CM

## 2020-09-29 DIAGNOSIS — F419 Anxiety disorder, unspecified: Secondary | ICD-10-CM

## 2020-09-29 DIAGNOSIS — F32A Depression, unspecified: Secondary | ICD-10-CM

## 2020-11-08 ENCOUNTER — Other Ambulatory Visit: Payer: Self-pay | Admitting: Family Medicine

## 2020-11-08 DIAGNOSIS — M47812 Spondylosis without myelopathy or radiculopathy, cervical region: Secondary | ICD-10-CM

## 2020-11-23 ENCOUNTER — Ambulatory Visit: Payer: Medicare Other | Admitting: Family Medicine

## 2020-11-24 ENCOUNTER — Ambulatory Visit: Payer: Medicare Other | Admitting: Family Medicine

## 2021-01-05 ENCOUNTER — Other Ambulatory Visit: Payer: Self-pay | Admitting: Family Medicine

## 2021-01-12 ENCOUNTER — Ambulatory Visit (INDEPENDENT_AMBULATORY_CARE_PROVIDER_SITE_OTHER): Payer: Medicare Other

## 2021-01-12 VITALS — Ht 66.0 in | Wt 175.0 lb

## 2021-01-12 DIAGNOSIS — Z0000 Encounter for general adult medical examination without abnormal findings: Secondary | ICD-10-CM | POA: Diagnosis not present

## 2021-01-12 NOTE — Progress Notes (Signed)
Subjective:   Wendy Collier is a 55 y.o. female who presents for Medicare Annual (Subsequent) preventive examination.  Virtual Visit via Telephone Note  I connected with  Wendy Collier on 01/12/21 at  9:00 AM EDT by telephone and verified that I am speaking with the correct person using two identifiers.  Location: Patient: Home Provider: [facility] Persons participating in the virtual visit: patient/Nurse Health Advisor   I discussed the limitations, risks, security and privacy concerns of performing an evaluation and management service by telephone and the availability of in person appointments. The patient expressed understanding and agreed to proceed.  Interactive audio and video telecommunications were attempted between this nurse and patient, however failed, due to patient having technical difficulties OR patient did not have access to video capability.  We continued and completed visit with audio only.  Some vital signs may be absent or patient reported.   Wendy Fite E Emilynn Srinivasan, LPN   Review of Systems     Cardiac Risk Factors include: dyslipidemia;sedentary lifestyle     Objective:    Today's Vitals   01/12/21 0921  Weight: 175 lb (79.4 kg)  Height: 5\' 6"  (1.676 m)   Body mass index is 28.25 kg/m.  Advanced Directives 01/12/2021 12/03/2019 12/02/2018 11/17/2018 11/15/2018 11/14/2018 04/30/2018  Does Patient Have a Medical Advance Directive? No No No No - No No  Would patient like information on creating a medical advance directive? No - Patient declined No - Patient declined No - Patient declined No - Patient declined No - Patient declined - -    Current Medications (verified) Outpatient Encounter Medications as of 01/12/2021  Medication Sig   tiZANidine (ZANAFLEX) 4 MG tablet TAKE 1 TABLET BY MOUTH THREE TIMES DAILY   ALPRAZolam (XANAX) 0.5 MG tablet Take 1 tablet (0.5 mg total) by mouth 2 (two) times daily as needed for anxiety. Put on file until patient request   benazepril  (LOTENSIN) 10 MG tablet Take 3 tablets (30 mg total) by mouth daily.   DULoxetine (CYMBALTA) 30 MG capsule Take 2 capsules (60 mg total) by mouth daily. With two 20 mg capsules   EUTHYROX 75 MCG tablet TAKE 1 TABLET BY MOUTH ONCE DAILY BEFORE  BREAKFAST   hydrochlorothiazide (HYDRODIURIL) 25 MG tablet Take 1 tablet (25 mg total) by mouth daily.   HYDROcodone-acetaminophen (NORCO) 7.5-325 MG tablet Take 1 tablet by mouth 4 (four) times daily as needed.   metoprolol succinate (TOPROL-XL) 25 MG 24 hr tablet Take 1 tablet by mouth once daily   Multiple Vitamins-Minerals (MULTIVITAMIN WITH MINERALS) tablet Take 1 tablet by mouth daily.   OLANZapine (ZYPREXA) 7.5 MG tablet Take 1 tablet (7.5 mg total) by mouth at bedtime.   oxyCODONE (OXY IR/ROXICODONE) 5 MG immediate release tablet Take 5 mg by mouth 5 (five) times daily as needed.   pantoprazole (PROTONIX) 40 MG tablet Take 1 tablet (40 mg total) by mouth at bedtime.   rosuvastatin (CRESTOR) 10 MG tablet Take 1 tablet (10 mg total) by mouth daily.   SENNA S 8.6-50 MG tablet TAKE (2) TABLETS DAILY AT BEDTIME.   No facility-administered encounter medications on file as of 01/12/2021.    Allergies (verified) Silver   History: Past Medical History:  Diagnosis Date   Anxiety disorder    with panic attacks.    Arthritis    Bipolar 1 disorder (HCC)    Chronic fatigue    Chronic pain    neck/back with spasms.    Depression    Fibromyalgia  Frequent headaches    Hypertension    Thyroid disease    Hypothyroidism    Past Surgical History:  Procedure Laterality Date   ANTERIOR CERVICAL DECOMP/DISCECTOMY FUSION     BACK SURGERY     KNEE SURGERY     Family History  Problem Relation Age of Onset   Alcohol abuse Mother    COPD Mother    Depression Mother    Drug abuse Mother    Early death Mother    Mental illness Mother    Alcohol abuse Father    Cancer Father        kidney   Mental illness Father    Mental illness Brother         schizophrenia   Social History   Socioeconomic History   Marital status: Married    Spouse name: Onalee Hua   Number of children: 1   Years of education: 15   Highest education level: Some college, no degree  Occupational History   Occupation: disability  Tobacco Use   Smoking status: Never   Smokeless tobacco: Never  Vaping Use   Vaping Use: Never used  Substance and Sexual Activity   Alcohol use: No   Drug use: No   Sexual activity: Yes    Birth control/protection: Post-menopausal    Comment: Married since 2010  Other Topics Concern   Not on file  Social History Narrative   Lives home with her husband   Social Determinants of Health   Financial Resource Strain: Low Risk    Difficulty of Paying Living Expenses: Not very hard  Food Insecurity: No Food Insecurity   Worried About Programme researcher, broadcasting/film/video in the Last Year: Never true   Ran Out of Food in the Last Year: Never true  Transportation Needs: No Transportation Needs   Lack of Transportation (Medical): No   Lack of Transportation (Non-Medical): No  Physical Activity: Not on file  Stress: Stress Concern Present   Feeling of Stress : To some extent  Social Connections: Moderately Integrated   Frequency of Communication with Friends and Family: More than three times a week   Frequency of Social Gatherings with Friends and Family: Twice a week   Attends Religious Services: 1 to 4 times per year   Active Member of [employer] or Organizations: No   Attends Engineer, structural: Never   Marital Status: Married    Tobacco Counseling Counseling given: Not Answered   Clinical Intake:  Pre-visit preparation completed: Yes  Pain : No/denies pain     BMI - recorded: 28.25 Nutritional Status: BMI 25 -29 Overweight Nutritional Risks: None Diabetes: No  How often do you need to have someone help you when you read instructions, pamphlets, or other written materials from your doctor or pharmacy?: 1 -  Never  Diabetic? no  Interpreter Needed?: No  Information entered by :: Carlisle Torgeson, LPN   Activities of Daily Living In your present state of health, do you have any difficulty performing the following activities: 01/12/2021  Hearing? N  Vision? N  Difficulty concentrating or making decisions? N  Walking or climbing stairs? N  Dressing or bathing? N  Doing errands, shopping? N  Preparing Food and eating ? N  Using the Toilet? N  In the past six months, have you accidently leaked urine? N  Do you have problems with loss of bowel control? N  Managing your Medications? N  Managing your Finances? N  Housekeeping or managing your Housekeeping? N  Some recent data might be hidden    Patient Care Team: Dettinger, Elige Radon, MD as PCP - General (Family Medicine) Frederich Balding, MD as Referring Physician (Neurology)  Indicate any recent Medical Services you may have received from other than [facility] providers in the past year (date may be approximate).     Assessment:   This is a routine wellness examination for Wendy Collier.  Hearing/Vision screen Hearing Screening - Comments:: Denies hearing difficulties  Vision Screening - Comments:: Wears eyeglasses - up to date with eye exam - last went to [facility] in [place], [place], but usually goes to [facility] in [place]  Dietary issues and exercise activities discussed: Current Exercise Habits: The patient does not participate in regular exercise at present, Exercise limited by: orthopedic condition(s);psychological condition(s)   Goals Addressed             This Visit's Progress    Patient Stated   On track    12/03/2019 AWV Goal: Fall Prevention  Over the next year, patient will decrease their risk for falls by: Using assistive devices, such as a cane or walker, as needed Identifying fall risks within their home and correcting them by: Removing throw rugs Adding handrails to stairs or ramps Removing clutter and keeping a  clear pathway throughout the home Increasing light, especially at night Adding shower handles/bars Raising toilet seat Identifying potential personal risk factors for falls: Medication side effects Incontinence/urgency Vestibular dysfunction Hearing loss Musculoskeletal disorders Neurological disorders Orthostatic hypotension         Depression Screen PHQ 2/9 Scores 01/12/2021 08/24/2020 04/18/2020 12/03/2019 09/28/2019 04/16/2019 12/12/2018  PHQ - 2 Score 2 0 0 0 0 0 0  PHQ- 9 Score 7 - - - - - -    Fall Risk Fall Risk  01/12/2021 08/24/2020 04/18/2020 12/03/2019 09/28/2019  Falls in the past year? 0 0 0 1 1  Number falls in past yr: 0 - - 1 0  Comment - - - multiple -  Injury with Fall? 0 - - 0 1  Risk for fall due to : Orthopedic patient;Impaired vision;Medication side effect - - History of fall(s) -  Risk for fall due to: Comment - - - states she is clumsy -  Follow up Falls prevention discussed - - Falls evaluation completed -    FALL RISK PREVENTION PERTAINING TO THE HOME:  Any stairs in or around the home? No  If so, are there any without handrails? No  Home free of loose throw rugs in walkways, pet beds, electrical cords, etc? Yes  Adequate lighting in your home to reduce risk of falls? Yes   ASSISTIVE DEVICES UTILIZED TO PREVENT FALLS:  Life alert? No  Use of a cane, walker or w/c? No  Grab bars in the bathroom? No  Shower chair or bench in shower? No  Elevated toilet seat or a handicapped toilet? No   TIMED UP AND GO:  Was the test performed? No . Telephonic visit  Cognitive Function: Normal cognitive status assessed by direct observation by this Nurse Health Advisor. No abnormalities found.       6CIT Screen 12/03/2019 12/02/2018  What Year? 0 points 0 points  What month? 0 points 0 points  What time? 0 points 0 points  Count back from 20 0 points 0 points  Months in reverse 0 points 0 points  Repeat phrase 2 points 2 points  Total Score 2 2     Immunizations Immunization History  Administered Date(s) Administered  Influenza,inj,Quad PF,6+ Mos 04/16/2019    TDAP status: Due, Education has been provided regarding the importance of this vaccine. Advised may receive this vaccine at local pharmacy or Health Dept. Aware to provide a copy of the vaccination record if obtained from local pharmacy or Health Dept. Verbalized acceptance and understanding.  Flu Vaccine status: Due, Education has been provided regarding the importance of this vaccine. Advised may receive this vaccine at local pharmacy or Health Dept. Aware to provide a copy of the vaccination record if obtained from local pharmacy or Health Dept. Verbalized acceptance and understanding.  Pneumococcal vaccine status: Due, Education has been provided regarding the importance of this vaccine. Advised may receive this vaccine at local pharmacy or Health Dept. Aware to provide a copy of the vaccination record if obtained from local pharmacy or Health Dept. Verbalized acceptance and understanding.  Covid-19 vaccine status: Completed vaccines we need dates  Qualifies for Shingles Vaccine? Yes   Zostavax completed No   Shingrix Completed?: No.    Education has been provided regarding the importance of this vaccine. Patient has been advised to call insurance company to determine out of pocket expense if they have not yet received this vaccine. Advised may also receive vaccine at local pharmacy or Health Dept. Verbalized acceptance and understanding.  Screening Tests Health Maintenance  Topic Date Due   Pneumococcal Vaccine 420-79 Years old (1 - PCV) Never done   Zoster Vaccines- Shingrix (1 of 2) Never done   TETANUS/TDAP  04/18/2021 (Originally 12/04/1986)   Hepatitis C Screening  04/18/2021 (Originally [birth year]03/1985)   INFLUENZA VACCINE  01/23/2021   MAMMOGRAM  08/30/2022   PAP SMEAR-Modifier  04/12/2023   COLONOSCOPY (Pts [id number] Insurance coverage will need to be confirmed)   10/24/2024   HIV Screening  Completed   HPV VACCINES  Aged Out    Health Maintenance  Health Maintenance Due  Topic Date Due   Pneumococcal Vaccine 770-19 Years old (1 - PCV) Never done   Zoster Vaccines- Shingrix (1 of 2) Never done    Colorectal cancer screening: Type of screening: Colonoscopy. Completed 10/25/2014. Repeat every 10 years  Mammogram status: Completed 08/29/2020. Repeat every year  Lung Cancer Screening: (Low Dose CT Chest recommended if Age 79-80 years, 30 pack-year currently smoking OR have quit w/in 15years.) does not qualify.   Additional Screening:  Hepatitis C Screening: does not qualify  Vision Screening: Recommended annual ophthalmology exams for early detection of glaucoma and other disorders of the eye. Is the patient up to date with their annual eye exam?  Yes  Who is the provider or what is the name of the office in which the patient attends annual [facility] exams[facility] If pt is not established with a provider, would they like to be referred to a provider to establish care? No .   Dental Screening: Recommended annual dental exams for proper oral hygiene  Community Resource Referral / Chronic Care Management: CRR required this visit?  No   CCM required this visit?  No      Plan:     I have personally reviewed and noted the following in the patient's chart:   Medical and social history Use of alcohol, tobacco or illicit drugs  Current medications and supplements including opioid prescriptions.  Functional ability and status Nutritional status Physical activity Advanced directives List of other physicians Hospitalizations, surgeries, and ER visits in previous 12 months Vitals Screenings to include cognitive, depression, and falls Referrals and appointments  In addition,  I have reviewed and discussed with patient certain preventive protocols, quality metrics, and best practice recommendations. A written personalized care plan for  preventive services as well as general preventive health recommendations were provided to patient.     [place] Constable, LPN   1/61/0960   Nurse Notes: None

## 2021-01-12 NOTE — Patient Instructions (Signed)
Wendy Collier , Thank you for taking time to come for your Medicare Wellness Visit. I appreciate your ongoing commitment to your health goals. Please review the following plan we discussed and let me know if I can assist you in the future.   Screening recommendations/referrals: Colonoscopy: Done 10/25/2014 - Repeat in 10 years  Mammogram: Done 08/29/2020 - Repeat annually  Bone Density: Due at age 55 Recommended yearly ophthalmology/optometry visit for glaucoma screening and checkup Recommended yearly dental visit for hygiene and checkup  Vaccinations: Influenza vaccine: Due every fall Pneumococcal vaccine: Due. 2 vaccines one year apart Tdap vaccine: Due every 10 years Shingles vaccine: Due. Shingrix discussed. Please contact your pharmacy for coverage information.    Covid-19: 2 doses complete - we need dates please  Advanced directives: Advance directive discussed with you today. Even though you declined this today, please call our office should you change your mind, and we can give you the proper paperwork for you to fill out.   Conditions/risks identified: Aim for 30 minutes of exercise or brisk walking each day, drink 6-8 glasses of water and eat lots of fruits and vegetables.   Next appointment: Follow up in one year for your annual wellness visit.   Preventive Care 40-64 Years, Female Preventive care refers to lifestyle choices and visits with your health care provider that can promote health and wellness. What does preventive care include? A yearly physical exam. This is also called an annual well check. Dental exams once or twice a year. Routine eye exams. Ask your health care provider how often you should have your eyes checked. Personal lifestyle choices, including: Daily care of your teeth and gums. Regular physical activity. Eating a healthy diet. Avoiding tobacco and drug use. Limiting alcohol use. Practicing safe sex. Taking low-dose aspirin daily starting at age  55. Taking vitamin and mineral supplements as recommended by your health care provider. What happens during an annual well check? The services and screenings done by your health care provider during your annual well check will depend on your age, overall health, lifestyle risk factors, and family history of disease. Counseling  Your health care provider may ask you questions about your: Alcohol use. Tobacco use. Drug use. Emotional well-being. Home and relationship well-being. Sexual activity. Eating habits. Work and work Statistician. Method of birth control. Menstrual cycle. Pregnancy history. Screening  You may have the following tests or measurements: Height, weight, and BMI. Blood pressure. Lipid and cholesterol levels. These may be checked every 5 years, or more frequently if you are over 42 years old. Skin check. Lung cancer screening. You may have this screening every year starting at age 62 if you have a 30-pack-year history of smoking and currently smoke or have quit within the past 15 years. Fecal occult blood test (FOBT) of the stool. You may have this test every year starting at age 98. Flexible sigmoidoscopy or colonoscopy. You may have a sigmoidoscopy every 5 years or a colonoscopy every 10 years starting at age 55. Hepatitis C blood test. Hepatitis B blood test. Sexually transmitted disease (STD) testing. Diabetes screening. This is done by checking your blood sugar (glucose) after you have not eaten for a while (fasting). You may have this done every 1-3 years. Mammogram. This may be done every 1-2 years. Talk to your health care provider about when you should start having regular mammograms. This may depend on whether you have a family history of breast cancer. BRCA-related cancer screening. This may be done if you  have a family history of breast, ovarian, tubal, or peritoneal cancers. Pelvic exam and Pap test. This may be done every 3 years starting at age 73.  Starting at age 61, this may be done every 5 years if you have a Pap test in combination with an HPV test. Bone density scan. This is done to screen for osteoporosis. You may have this scan if you are at high risk for osteoporosis. Discuss your test results, treatment options, and if necessary, the need for more tests with your health care provider. Vaccines  Your health care provider may recommend certain vaccines, such as: Influenza vaccine. This is recommended every year. Tetanus, diphtheria, and acellular pertussis (Tdap, Td) vaccine. You may need a Td booster every 10 years. Zoster vaccine. You may need this after age 63. Pneumococcal 13-valent conjugate (PCV13) vaccine. You may need this if you have certain conditions and were not previously vaccinated. Pneumococcal polysaccharide (PPSV23) vaccine. You may need one or two doses if you smoke cigarettes or if you have certain conditions. Talk to your health care provider about which screenings and vaccines you need and how often you need them. This information is not intended to replace advice given to you by your health care provider. Make sure you discuss any questions you have with your health care provider. Document Released: 07/08/2015 Document Revised: 02/29/2016 Document Reviewed: 04/12/2015 Elsevier Interactive Patient Education  2017 [employer] Prevention in the Home Falls can cause injuries. They can happen to people of all ages. There are many things you can do to make your home safe and to help prevent falls. What can I do on the outside of my home? Regularly fix the edges of walkways and driveways and fix any cracks. Remove anything that might make you trip as you walk through a door, such as a raised step or threshold. Trim any bushes or trees on the path to your home. Use bright outdoor lighting. Clear any walking paths of anything that might make someone trip, such as rocks or tools. Regularly check to see if  handrails are loose or broken. Make sure that both sides of any steps have handrails. Any raised decks and porches should have guardrails on the edges. Have any leaves, snow, or ice cleared regularly. Use sand or salt on walking paths during winter. Clean up any spills in your garage right away. This includes oil or grease spills. What can I do in the bathroom? Use night lights. Install grab bars by the toilet and in the tub and shower. Do not use towel bars as grab bars. Use non-skid mats or decals in the tub or shower. If you need to sit down in the shower, use a plastic, non-slip stool. Keep the floor dry. Clean up any water that spills on the floor as soon as it happens. Remove soap buildup in the tub or shower regularly. Attach bath mats securely with double-sided non-slip rug tape. Do not have throw rugs and other things on the floor that can make you trip. What can I do in the bedroom? Use night lights. Make sure that you have a light by your bed that is easy to reach. Do not use any sheets or blankets that are too big for your bed. They should not hang down onto the floor. Have a firm chair that has side arms. You can use this for support while you get dressed. Do not have throw rugs and other things on the floor that  can make you trip. What can I do in the kitchen? Clean up any spills right away. Avoid walking on wet floors. Keep items that you use a lot in easy-to-reach places. If you need to reach something above you, use a strong step stool that has a grab bar. Keep electrical cords out of the way. Do not use floor polish or wax that makes floors slippery. If you must use wax, use non-skid floor wax. Do not have throw rugs and other things on the floor that can make you trip. What can I do with my stairs? Do not leave any items on the stairs. Make sure that there are handrails on both sides of the stairs and use them. Fix handrails that are broken or loose. Make sure that  handrails are as long as the stairways. Check any carpeting to make sure that it is firmly attached to the stairs. Fix any carpet that is loose or worn. Avoid having throw rugs at the top or bottom of the stairs. If you do have throw rugs, attach them to the floor with carpet tape. Make sure that you have a light switch at the top of the stairs and the bottom of the stairs. If you do not have them, ask someone to add them for you. What else can I do to help prevent falls? Wear shoes that: Do not have high heels. Have rubber bottoms. Are comfortable and fit you well. Are closed at the toe. Do not wear sandals. If you use a stepladder: Make sure that it is fully opened. Do not climb a closed stepladder. Make sure that both sides of the stepladder are locked into place. Ask someone to hold it for you, if possible. Clearly mark and make sure that you can see: Any grab bars or handrails. First and last steps. Where the edge of each step is. Use tools that help you move around (mobility aids) if they are needed. These include: Canes. Walkers. Scooters. Crutches. Turn on the lights when you go into a dark area. Replace any light bulbs as soon as they burn out. Set up your furniture so you have a clear path. Avoid moving your furniture around. If any of your floors are uneven, fix them. If there are any pets around you, be aware of where they are. Review your medicines with your doctor. Some medicines can make you feel dizzy. This can increase your chance of falling. Ask your doctor what other things that you can do to help prevent falls. This information is not intended to replace advice given to you by your health care provider. Make sure you discuss any questions you have with your health care provider. Document Released: 04/07/2009 Document Revised: 11/17/2015 Document Reviewed: 07/16/2014 Elsevier Interactive Patient Education  2017 [employer].

## 2021-01-23 ENCOUNTER — Encounter: Payer: Self-pay | Admitting: Family Medicine

## 2021-01-23 ENCOUNTER — Ambulatory Visit (INDEPENDENT_AMBULATORY_CARE_PROVIDER_SITE_OTHER): Payer: Medicare Other | Admitting: Family Medicine

## 2021-01-23 ENCOUNTER — Other Ambulatory Visit: Payer: Self-pay

## 2021-01-23 VITALS — BP 106/69 | HR 87 | Ht 66.0 in | Wt 175.0 lb

## 2021-01-23 DIAGNOSIS — F32A Depression, unspecified: Secondary | ICD-10-CM

## 2021-01-23 DIAGNOSIS — E782 Mixed hyperlipidemia: Secondary | ICD-10-CM

## 2021-01-23 DIAGNOSIS — Z79899 Other long term (current) drug therapy: Secondary | ICD-10-CM

## 2021-01-23 DIAGNOSIS — F419 Anxiety disorder, unspecified: Secondary | ICD-10-CM | POA: Diagnosis not present

## 2021-01-23 DIAGNOSIS — E039 Hypothyroidism, unspecified: Secondary | ICD-10-CM

## 2021-01-23 DIAGNOSIS — I10 Essential (primary) hypertension: Secondary | ICD-10-CM

## 2021-01-23 DIAGNOSIS — F1320 Sedative, hypnotic or anxiolytic dependence, uncomplicated: Secondary | ICD-10-CM

## 2021-01-23 DIAGNOSIS — G4700 Insomnia, unspecified: Secondary | ICD-10-CM

## 2021-01-23 MED ORDER — ALPRAZOLAM 0.5 MG PO TABS
0.5000 mg | ORAL_TABLET | Freq: Two times a day (BID) | ORAL | 2 refills | Status: DC | PRN
Start: 2021-01-23 — End: 2021-04-19

## 2021-01-23 MED ORDER — DESVENLAFAXINE SUCCINATE ER 50 MG PO TB24: ORAL_TABLET | ORAL | 1 refills | Status: DC

## 2021-01-23 NOTE — Progress Notes (Signed)
BP 106/69   Pulse 87   Ht 5' 6" (1.676 m)   Wt 175 lb (79.4 kg)   LMP 08/28/2016 (Approximate) Comment: Pt reports being unknown when she had her last period, but had spotting two days ago.   SpO2 98%   BMI 28.25 kg/m    Subjective:   Patient ID: Wendy Collier, female    DOB: 03-17-1966, 56 y.o.   MRN: [id number]  HPI: Wendy Collier is a 55 y.o. female presenting on 01/23/2021 for Medical Management of Chronic Issues, Hypertension, Depression, and Anxiety   HPI Anxiety recheck Current rx-Xanax 0.5 mg twice daily as needed, patient does not take every day # meds rx-60 Effectiveness of current meds-works well except for anxiety is not doing as well and wants to change the Cymbalta Adverse reactions form meds-none  Pill count performed-No Last drug screen -08/29/2020, failed because of amphetamine, and she showed me a prescription today of an old amphetamine prescription from a previous provider 2 years ago that she says she just takes every now and then.  She is almost out of them completely ( high risk q68m moderate risk q660mlow risk yearly ) Urine drug screen today- No [place]  If yes were their any concerning findings? -Patient is also on pain management and warned of the interaction  No flowsheet data found.   Controlled substance contract signed on: 08/29/2020  Hypertension Patient is currently on benazepril and hydrochlorothiazide, and their blood pressure today is 106/69. Patient denies any lightheadedness or dizziness. Patient denies headaches, blurred vision, chest pains, shortness of breath, or weakness. Denies any side effects from medication and is content with current medication.   Hypothyroidism recheck Patient is coming in for thyroid recheck today as well. They deny any issues with hair changes or heat or cold problems or diarrhea or constipation. They deny any chest pain or palpitations. They are currently on levothyroxine 75 micrograms    Hyperlipidemia Patient is coming in for recheck of his hyperlipidemia. The patient is currently taking Crestor. They deny any issues with myalgias or history of liver damage from it. They deny any focal numbness or weakness or chest pain.   Relevant past medical, surgical, family and social history reviewed and updated as indicated. Interim medical history since our last visit reviewed. Allergies and medications reviewed and updated.  Review of Systems  Constitutional:  Negative for chills and fever.  HENT:  Negative for ear pain.   Eyes:  Negative for redness and visual disturbance.  Respiratory:  Negative for chest tightness and shortness of breath.   Cardiovascular:  Negative for chest pain and leg swelling.  Musculoskeletal:  Negative for back pain and gait problem.  Skin:  Negative for rash.  Neurological:  Negative for light-headedness and headaches.  Psychiatric/Behavioral:  Positive for dysphoric mood and sleep disturbance. Negative for agitation and behavioral problems. The patient is nervous/anxious.   All other systems reviewed and are negative.  Per HPI unless specifically indicated above   Allergies as of 01/23/2021       Reactions   Silver Rash   _0  Tegaderm        Medication List        Accurate as of January 23, 2021  2:43 PM. If you have any questions, ask your nurse or doctor.          STOP taking these medications    DULoxetine 30 MG capsule Commonly known as: CYMBALTA Stopped by:  Fransisca Kaufmann Payzlee Ryder, MD       TAKE these medications    ALPRAZolam 0.5 MG tablet Commonly known as: XANAX Take 1 tablet (0.5 mg total) by mouth 2 (two) times daily as needed for anxiety. Put on file until patient request   benazepril 10 MG tablet Commonly known as: LOTENSIN Take 3 tablets (30 mg total) by mouth daily.   desvenlafaxine 50 MG 24 hr tablet Commonly known as: Pristiq Take 1 tablet (50 mg total) by mouth  daily for 7 days, THEN 2 tablets (100 mg total) daily. Start taking on: January 23, 2021 Started by: Fransisca Kaufmann Tatum Corl, MD   Euthyrox 75 MCG tablet Generic drug: levothyroxine TAKE 1 TABLET BY MOUTH ONCE DAILY BEFORE  BREAKFAST   hydrochlorothiazide 25 MG tablet Commonly known as: HYDRODIURIL Take 1 tablet (25 mg total) by mouth daily.   HYDROcodone-acetaminophen 7.5-325 MG tablet Commonly known as: NORCO Take 1 tablet by mouth 4 (four) times daily as needed.   metoprolol succinate 25 MG 24 hr tablet Commonly known as: TOPROL-XL Take 1 tablet by mouth once daily   multivitamin with minerals tablet Take 1 tablet by mouth daily.   OLANZapine 7.5 MG tablet Commonly known as: ZyPREXA Take 1 tablet (7.5 mg total) by mouth at bedtime.   oxyCODONE 5 MG immediate release tablet Commonly known as: Oxy IR/ROXICODONE Take 5 mg by mouth 5 (five) times daily as needed.   pantoprazole 40 MG tablet Commonly known as: PROTONIX Take 1 tablet (40 mg total) by mouth at bedtime.   rosuvastatin 10 MG tablet Commonly known as: CRESTOR Take 1 tablet (10 mg total) by mouth daily.   Senna S 8.6-50 MG tablet Generic drug: senna-docusate TAKE (2) TABLETS DAILY AT BEDTIME.   tiZANidine 4 MG tablet Commonly known as: ZANAFLEX TAKE 1 TABLET BY MOUTH THREE TIMES DAILY         Objective:   BP 106/69   Pulse 87   Ht 5' 6" (1.676 m)   Wt 175 lb (79.4 kg)   LMP 08/28/2016 (Approximate) Comment: Pt reports being unknown when she had her last period, but had spotting two days ago.   SpO2 98%   BMI 28.25 kg/m   Wt Readings from Last 3 Encounters:  01/23/21 175 lb (79.4 kg)  01/12/21 175 lb (79.4 kg)  08/24/20 175 lb (79.4 kg)    Physical Exam Vitals and nursing note reviewed.  Constitutional:      General: She is not in acute distress.    Appearance: She is well-developed. She is not diaphoretic.  Eyes:     Conjunctiva/sclera: Conjunctivae normal.  Cardiovascular:     Rate and  Rhythm: Normal rate and regular rhythm.     Heart sounds: Normal heart sounds. No murmur heard. Pulmonary:     Effort: Pulmonary effort is normal. No respiratory distress.     Breath sounds: Normal breath sounds. No wheezing.  Musculoskeletal:        General: No tenderness. Normal range of motion.  Skin:    General: Skin is warm and dry.     Findings: No rash.  Neurological:     Mental Status: She is alert and oriented to person, place, and time.     Coordination: Coordination normal.  Psychiatric:        Mood and Affect: Mood is anxious.        Behavior: Behavior normal.        Thought Content: Thought content does not include suicidal ideation.  Thought content does not include suicidal plan.      Assessment & Plan:   Problem List Items Addressed This Visit       Cardiovascular and Mediastinum   Essential hypertension   Relevant Orders   CMP14+EGFR     Endocrine   Hypothyroidism - Primary   Relevant Orders   TSH     Other   Anxiety and depression   Relevant Medications   ALPRAZolam (XANAX) 0.5 MG tablet   desvenlafaxine (PRISTIQ) 50 MG 24 hr tablet   Hyperlipemia   Relevant Orders   Lipid panel   Controlled substance agreement signed   Relevant Medications   ALPRAZolam (XANAX) 0.5 MG tablet   Benzodiazepine dependence (HCC)   Relevant Medications   ALPRAZolam (XANAX) 0.5 MG tablet   Other Visit Diagnoses     Insomnia, unspecified type       Relevant Medications   ALPRAZolam (XANAX) 0.5 MG tablet       Will taper patient down off of Cymbalta, take 30 mg daily for 1 week and then take 30 mg every other day for 1 week and then start the Pristiq at 1 tablet of 50 mg daily for 1 week and then go up to 100 mg daily in the second week and take that every day  failed drug screen because of amphetamine, and she showed me a prescription today of an old amphetamine prescription from a previous provider 2 years ago that she says she just takes every now and then.   She is almost out of them completely, and it was a legitimate prescription source Follow up plan: Return in about 2 months (around 03/25/2021), or if symptoms worsen or fail to improve, for Recheck anxiety depression.  Counseling provided for all of the vaccine components Orders Placed This Encounter  Procedures   CMP14+EGFR   TSH   Lipid panel    Caryl Pina, MD [facility] 01/23/2021, 2:43 PM

## 2021-01-23 NOTE — Patient Instructions (Signed)
  Will taper patient down off of Cymbalta, take 30 mg daily for 1 week and then take 30 mg every other day for 1 week and then start the Pristiq at 1 tablet of 50 mg daily for 1 week and then go up to 100 mg daily in the second week and take that every day

## 2021-01-26 ENCOUNTER — Telehealth: Payer: Self-pay | Admitting: Family Medicine

## 2021-01-26 NOTE — Telephone Encounter (Signed)
Pt called back stating that she found a coupon for the Pristiq on [facility] and says she can afford the Pristiq Rx with the coupon..  Disregard original message.

## 2021-01-26 NOTE — Telephone Encounter (Signed)
Pt called to let Dr Dettinger know that the Pristiq is going to cost her over $100 to fill. Pt wants Dr Dettinger to send in Rx for Effexor instead.  Please advise and call patient.

## 2021-02-08 ENCOUNTER — Other Ambulatory Visit: Payer: Self-pay | Admitting: Family Medicine

## 2021-02-08 DIAGNOSIS — G4700 Insomnia, unspecified: Secondary | ICD-10-CM

## 2021-02-08 DIAGNOSIS — F32A Depression, unspecified: Secondary | ICD-10-CM

## 2021-02-08 NOTE — Telephone Encounter (Signed)
Last office visit  01/23/21 Last refill 08/24/20, #90, 1 refill

## 2021-02-13 ENCOUNTER — Other Ambulatory Visit: Payer: Self-pay | Admitting: Family Medicine

## 2021-02-13 DIAGNOSIS — F32A Depression, unspecified: Secondary | ICD-10-CM

## 2021-02-13 DIAGNOSIS — G4700 Insomnia, unspecified: Secondary | ICD-10-CM

## 2021-02-13 DIAGNOSIS — F419 Anxiety disorder, unspecified: Secondary | ICD-10-CM

## 2021-03-25 ENCOUNTER — Other Ambulatory Visit: Payer: Self-pay | Admitting: Family Medicine

## 2021-03-25 DIAGNOSIS — F32A Depression, unspecified: Secondary | ICD-10-CM

## 2021-03-25 DIAGNOSIS — G4700 Insomnia, unspecified: Secondary | ICD-10-CM

## 2021-03-25 DIAGNOSIS — F419 Anxiety disorder, unspecified: Secondary | ICD-10-CM

## 2021-03-25 DIAGNOSIS — M47812 Spondylosis without myelopathy or radiculopathy, cervical region: Secondary | ICD-10-CM

## 2021-03-25 DIAGNOSIS — I619 Nontraumatic intracerebral hemorrhage, unspecified: Secondary | ICD-10-CM

## 2021-03-25 DIAGNOSIS — I611 Nontraumatic intracerebral hemorrhage in hemisphere, cortical: Secondary | ICD-10-CM

## 2021-03-27 ENCOUNTER — Ambulatory Visit: Payer: Medicare Other | Admitting: Family Medicine

## 2021-03-27 ENCOUNTER — Encounter: Payer: Self-pay | Admitting: Family Medicine

## 2021-03-29 ENCOUNTER — Ambulatory Visit: Payer: Medicare Other | Admitting: Family Medicine

## 2021-04-19 ENCOUNTER — Encounter: Payer: Self-pay | Admitting: Family Medicine

## 2021-04-19 ENCOUNTER — Ambulatory Visit (INDEPENDENT_AMBULATORY_CARE_PROVIDER_SITE_OTHER): Payer: Medicare Other | Admitting: Family Medicine

## 2021-04-19 ENCOUNTER — Other Ambulatory Visit: Payer: Self-pay | Admitting: Family Medicine

## 2021-04-19 ENCOUNTER — Other Ambulatory Visit: Payer: Self-pay

## 2021-04-19 VITALS — BP 122/86 | HR 90 | Ht 66.0 in | Wt 179.0 lb

## 2021-04-19 DIAGNOSIS — I10 Essential (primary) hypertension: Secondary | ICD-10-CM

## 2021-04-19 DIAGNOSIS — F1320 Sedative, hypnotic or anxiolytic dependence, uncomplicated: Secondary | ICD-10-CM | POA: Diagnosis not present

## 2021-04-19 DIAGNOSIS — F419 Anxiety disorder, unspecified: Secondary | ICD-10-CM | POA: Diagnosis not present

## 2021-04-19 DIAGNOSIS — Z23 Encounter for immunization: Secondary | ICD-10-CM

## 2021-04-19 DIAGNOSIS — F32A Depression, unspecified: Secondary | ICD-10-CM | POA: Diagnosis not present

## 2021-04-19 DIAGNOSIS — E782 Mixed hyperlipidemia: Secondary | ICD-10-CM

## 2021-04-19 DIAGNOSIS — E039 Hypothyroidism, unspecified: Secondary | ICD-10-CM

## 2021-04-19 DIAGNOSIS — Z79899 Other long term (current) drug therapy: Secondary | ICD-10-CM

## 2021-04-19 DIAGNOSIS — I611 Nontraumatic intracerebral hemorrhage in hemisphere, cortical: Secondary | ICD-10-CM

## 2021-04-19 DIAGNOSIS — M47812 Spondylosis without myelopathy or radiculopathy, cervical region: Secondary | ICD-10-CM

## 2021-04-19 MED ORDER — METOPROLOL SUCCINATE ER 25 MG PO TB24: 25.0000 mg | ORAL_TABLET | Freq: Every day | ORAL | 3 refills | Status: DC

## 2021-04-19 MED ORDER — TIZANIDINE HCL 4 MG PO TABS
4.0000 mg | ORAL_TABLET | Freq: Three times a day (TID) | ORAL | 0 refills | Status: DC
Start: 2021-04-19 — End: 2021-04-24

## 2021-04-19 MED ORDER — DESVENLAFAXINE SUCCINATE ER 100 MG PO TB24: 100.0000 mg | ORAL_TABLET | Freq: Every day | ORAL | 3 refills | Status: DC

## 2021-04-19 MED ORDER — ROSUVASTATIN CALCIUM 10 MG PO TABS: 10.0000 mg | ORAL_TABLET | Freq: Every day | ORAL | 3 refills | Status: DC

## 2021-04-19 MED ORDER — ALPRAZOLAM 0.5 MG PO TABS: 0.5000 mg | ORAL_TABLET | Freq: Two times a day (BID) | ORAL | 2 refills | Status: DC | PRN

## 2021-04-19 MED ORDER — BENAZEPRIL HCL 10 MG PO TABS: 30.0000 mg | ORAL_TABLET | Freq: Every day | ORAL | 3 refills | Status: DC

## 2021-04-19 MED ORDER — OLANZAPINE 7.5 MG PO TABS: 7.5000 mg | ORAL_TABLET | Freq: Every day | ORAL | 3 refills | Status: DC

## 2021-04-19 NOTE — Progress Notes (Signed)
BP 122/86   Pulse 90   Ht '5\' 6"'  (1.676 m)   Wt 179 lb (81.2 kg)   LMP 08/28/2016 (Approximate) Comment: Pt reports being unknown when she had her last period, but had spotting two days ago.   SpO2 96%   BMI 28.89 kg/m    Subjective:   Patient ID: Wendy Collier, female    DOB: [birth year], 55 y.o.   MRN: [id number]  HPI: Wendy Collier is a 55 y.o. female presenting on 04/19/2021 for Medical Management of Chronic Issues, Anxiety, and Depression   HPI Hypothyroidism recheck Patient is coming in for thyroid recheck today as well. They deny any issues with hair changes or heat or cold problems or diarrhea or constipation. They deny any chest pain or palpitations. They are currently on levothyroxine 75 micrograms   Hypertension Patient is currently on benazepril and metoprolol and hydrochlorothiazide, and their blood pressure today is 122/86. Patient denies any lightheadedness or dizziness. Patient denies headaches, blurred vision, chest pains, shortness of breath, or weakness. Denies any side effects from medication and is content with current medication.   Hyperlipidemia Patient is coming in for recheck of his hyperlipidemia. The patient is currently taking Crestor. They deny any issues with myalgias or history of liver damage from it. They deny any focal numbness or weakness or chest pain.   Anxiety recheck Current rx-alprazolam 0.5 mg twice daily as needed # meds rx-55 Effectiveness of current meds-works well, she says she is still nervous person Adverse reactions form meds-none  Pill count performed-No Last drug screen -08/29/2020 ( high risk q28m moderate risk q614mlow risk yearly ) Urine drug screen today- No [place]  If yes were their any concerning findings? -Patient recently increased from hydrocodone to oxycodone  No flowsheet data found.   Controlled substance contract signed on: 08/29/2020  Relevant past medical, surgical, family and social history  reviewed and updated as indicated. Interim medical history since our last visit reviewed. Allergies and medications reviewed and updated.  Review of Systems  Constitutional:  Negative for chills and fever.  Eyes:  Negative for visual disturbance.  Respiratory:  Negative for chest tightness and shortness of breath.   Cardiovascular:  Negative for chest pain and leg swelling.  Genitourinary:  Negative for difficulty urinating and dysuria.  Musculoskeletal:  Negative for back pain and gait problem.  Skin:  Negative for rash.  Neurological:  Negative for light-headedness and headaches.  Psychiatric/Behavioral:  Positive for sleep disturbance. Negative for agitation, behavioral problems, decreased concentration, self-injury and suicidal ideas. The patient is nervous/anxious.   All other systems reviewed and are negative.  Per HPI unless specifically indicated above   Allergies as of 04/19/2021       Reactions   Silver Rash   Tegaderm Tegaderm Tegaderm Tegaderm Tegaderm Tegaderm        Medication List        Accurate as of April 19, 2021  1:28 PM. If you have any questions, ask your nurse or doctor.          ALPRAZolam 0.5 MG tablet Commonly known as: XANAX Take 1 tablet (0.5 mg total) by mouth 2 (two) times daily as needed for anxiety. Put on file until patient request   benazepril 10 MG tablet Commonly known as: LOTENSIN Take 3 tablets (30 mg total) by mouth daily.   desvenlafaxine 50 MG 24 hr tablet Commonly known as: Pristiq Take 1 tablet (50 mg total) by mouth daily for 7 days,  THEN 2 tablets (100 mg total) daily. Start taking on: January 23, 2021   Euthyrox 75 MCG tablet Generic drug: levothyroxine TAKE 1 TABLET BY MOUTH ONCE DAILY BEFORE  BREAKFAST   hydrochlorothiazide 25 MG tablet Commonly known as: HYDRODIURIL Take 1 tablet (25 mg total) by mouth daily.   HYDROcodone-acetaminophen 7.5-325 MG tablet Commonly known as: NORCO Take 1 tablet by mouth 4  (four) times daily as needed.   metoprolol succinate 25 MG 24 hr tablet Commonly known as: TOPROL-XL Take 1 tablet (25 mg total) by mouth daily.   multivitamin with minerals tablet Take 1 tablet by mouth daily.   OLANZapine 7.5 MG tablet Commonly known as: ZYPREXA Take 1 tablet (7.5 mg total) by mouth at bedtime.   oxyCODONE 5 MG immediate release tablet Commonly known as: Oxy IR/ROXICODONE Take 5 mg by mouth 5 (five) times daily as needed.   pantoprazole 40 MG tablet Commonly known as: PROTONIX Take 1 tablet (40 mg total) by mouth at bedtime.   rosuvastatin 10 MG tablet Commonly known as: CRESTOR Take 1 tablet (10 mg total) by mouth daily.   Senna S 8.6-50 MG tablet Generic drug: senna-docusate TAKE (2) TABLETS DAILY AT BEDTIME.   tiZANidine 4 MG tablet Commonly known as: ZANAFLEX TAKE 1 TABLET BY MOUTH THREE TIMES DAILY         Objective:   BP 122/86   Pulse 90   Ht '5\' 6"'  (1.676 m)   Wt 179 lb (81.2 kg)   LMP 08/28/2016 (Approximate) Comment: Pt reports being unknown when she had her last period, but had spotting two days ago.   SpO2 96%   BMI 28.89 kg/m   Wt Readings from Last 3 Encounters:  04/19/21 179 lb (81.2 kg)  01/23/21 175 lb (79.4 kg)  01/12/21 175 lb (79.4 kg)    Physical Exam Vitals and nursing note reviewed.  Constitutional:      General: She is not in acute distress.    Appearance: She is well-developed. She is not diaphoretic.  Eyes:     Conjunctiva/sclera: Conjunctivae normal.  Cardiovascular:     Rate and Rhythm: Normal rate and regular rhythm.     Heart sounds: Normal heart sounds. No murmur heard. Pulmonary:     Effort: Pulmonary effort is normal. No respiratory distress.     Breath sounds: Normal breath sounds. No wheezing.  Musculoskeletal:        General: No swelling or tenderness. Normal range of motion.  Skin:    General: Skin is warm and dry.     Findings: No rash.  Neurological:     Mental Status: She is alert and  oriented to person, place, and time.     Coordination: Coordination normal.  Psychiatric:        Mood and Affect: Mood is anxious. Mood is not depressed.        Behavior: Behavior normal.        Thought Content: Thought content does not include suicidal ideation. Thought content does not include suicidal plan.      Assessment & Plan:   Problem List Items Addressed This Visit       Cardiovascular and Mediastinum   Essential hypertension   Relevant Medications   benazepril (LOTENSIN) 10 MG tablet   metoprolol succinate (TOPROL-XL) 25 MG 24 hr tablet   rosuvastatin (CRESTOR) 10 MG tablet   Other Relevant Orders   CBC with Differential/Platelet   CMP14+EGFR   Lipid panel     Endocrine  Hypothyroidism   Relevant Medications   metoprolol succinate (TOPROL-XL) 25 MG 24 hr tablet   Other Relevant Orders   TSH     Nervous and Auditory   ICH (intracerebral hemorrhage) (HCC)   Relevant Medications   metoprolol succinate (TOPROL-XL) 25 MG 24 hr tablet     Other   Controlled substance agreement signed   Relevant Medications   ALPRAZolam (XANAX) 0.5 MG tablet   Benzodiazepine dependence (HCC)   Relevant Medications   ALPRAZolam (XANAX) 0.5 MG tablet   Anxiety and depression   Relevant Medications   ALPRAZolam (XANAX) 0.5 MG tablet   OLANZapine (ZYPREXA) 7.5 MG tablet   Other Relevant Orders   CBC with Differential/Platelet   CMP14+EGFR   Lipid panel   Hyperlipemia - Primary   Relevant Medications   benazepril (LOTENSIN) 10 MG tablet   metoprolol succinate (TOPROL-XL) 25 MG 24 hr tablet   rosuvastatin (CRESTOR) 10 MG tablet   Other Relevant Orders   Lipid panel  Patient has increased from hydrocodone to oxycodone and discussed concerns about the interactions with Xanax and how we will start to taper the Xanax slowly over the next year.  Follow up plan: Return in about 3 months (around 07/20/2021), or if symptoms worsen or fail to improve, for Anxiety and depression  recheck and cholesterol.  Counseling provided for all of the vaccine components Orders Placed This Encounter  Procedures   CBC with Differential/Platelet   CMP14+EGFR   Lipid panel   TSH    Caryl Pina, MD [facility] 04/19/2021, 1:28 PM

## 2021-04-19 NOTE — Addendum Note (Signed)
Addended by: Dorene Sorrow on: 04/19/2021 01:46 PM   Modules accepted: Orders

## 2021-04-20 ENCOUNTER — Other Ambulatory Visit: Payer: Self-pay | Admitting: Family Medicine

## 2021-04-20 DIAGNOSIS — M47812 Spondylosis without myelopathy or radiculopathy, cervical region: Secondary | ICD-10-CM

## 2021-04-20 LAB — CBC WITH DIFFERENTIAL/PLATELET
Basophils Absolute: 0 10*3/uL (ref 0.0–0.2)
Basos: 0 %
EOS (ABSOLUTE): 0.2 10*3/uL (ref 0.0–0.4)
Eos: 4 %
Hematocrit: 41.1 % (ref 34.0–46.6)
Hemoglobin: 13.6 g/dL (ref 11.1–15.9)
Immature Grans (Abs): 0 10*3/uL (ref 0.0–0.1)
Immature Granulocytes: 0 %
Lymphocytes Absolute: 1.9 10*3/uL (ref 0.7–3.1)
Lymphs: 39 %
MCH: 28.5 pg (ref 26.6–33.0)
MCHC: 33.1 g/dL (ref 31.5–35.7)
MCV: 86 fL (ref 79–97)
Monocytes Absolute: 0.4 10*3/uL (ref 0.1–0.9)
Monocytes: 8 %
Neutrophils Absolute: 2.4 10*3/uL (ref 1.4–7.0)
Neutrophils: 49 %
Platelets: 212 10*3/uL (ref 150–450)
RBC: 4.78 x10E6/uL (ref 3.77–5.28)
RDW: 12.9 % (ref 11.7–15.4)
WBC: 4.8 10*3/uL (ref 3.4–10.8)

## 2021-04-20 LAB — CMP14+EGFR
ALT: 30 IU/L (ref 0–32)
AST: 28 IU/L (ref 0–40)
Albumin/Globulin Ratio: 2 (ref 1.2–2.2)
Albumin: 4.6 g/dL (ref 3.8–4.9)
Alkaline Phosphatase: 152 IU/L — ABNORMAL HIGH (ref 44–121)
BUN/Creatinine Ratio: 17 (ref 9–23)
BUN: 15 mg/dL (ref 6–24)
Bilirubin Total: 0.2 mg/dL (ref 0.0–1.2)
CO2: 24 mmol/L (ref 20–29)
Calcium: 9.8 mg/dL (ref 8.7–10.2)
Chloride: 100 mmol/L (ref 96–106)
Creatinine, Ser: 0.86 mg/dL (ref 0.57–1.00)
Globulin, Total: 2.3 g/dL (ref 1.5–4.5)
Glucose: 200 mg/dL — ABNORMAL HIGH (ref 70–99)
Potassium: 4.6 mmol/L (ref 3.5–5.2)
Sodium: 140 mmol/L (ref 134–144)
Total Protein: 6.9 g/dL (ref 6.0–8.5)
eGFR: 81 mL/min/{1.73_m2} (ref >59)

## 2021-04-20 LAB — LIPID PANEL
Chol/HDL Ratio: 4 ratio (ref 0.0–4.4)
Cholesterol, Total: 139 mg/dL (ref 100–199)
HDL: 35 mg/dL — ABNORMAL LOW (ref >39)
LDL Chol Calc (NIH): 50 mg/dL (ref 0–99)
Triglycerides: 355 mg/dL — ABNORMAL HIGH (ref 0–149)
VLDL Cholesterol Cal: 54 mg/dL — ABNORMAL HIGH (ref 5–40)

## 2021-04-20 LAB — TSH: TSH: 1.52 u[IU]/mL (ref 0.450–4.500)

## 2021-04-22 ENCOUNTER — Other Ambulatory Visit: Payer: Self-pay | Admitting: Family Medicine

## 2021-04-22 ENCOUNTER — Encounter: Payer: Self-pay | Admitting: Family Medicine

## 2021-04-22 DIAGNOSIS — M47812 Spondylosis without myelopathy or radiculopathy, cervical region: Secondary | ICD-10-CM

## 2021-04-24 MED ORDER — TIZANIDINE HCL 4 MG PO TABS: 4.0000 mg | ORAL_TABLET | Freq: Three times a day (TID) | ORAL | 1 refills | Status: DC

## 2021-04-29 ENCOUNTER — Encounter: Payer: Self-pay | Admitting: Family Medicine

## 2021-05-01 ENCOUNTER — Other Ambulatory Visit: Payer: Self-pay | Admitting: Family Medicine

## 2021-05-01 MED ORDER — FENOFIBRATE 145 MG PO TABS: 145.0000 mg | ORAL_TABLET | Freq: Every day | ORAL | 1 refills | Status: DC

## 2021-05-01 NOTE — Progress Notes (Signed)
I sent in the fenofibrate for you, it is 1 you can try for the triglycerides, I would not stop the Crestor because it is helping prevent future strokes and keeping the LDL cholesterol down which is worse if it is high so I would just this 1 to it.

## 2021-06-22 ENCOUNTER — Other Ambulatory Visit: Payer: Self-pay | Admitting: Family Medicine

## 2021-06-22 DIAGNOSIS — Z79899 Other long term (current) drug therapy: Secondary | ICD-10-CM

## 2021-06-22 DIAGNOSIS — F32A Depression, unspecified: Secondary | ICD-10-CM

## 2021-06-22 DIAGNOSIS — F1320 Sedative, hypnotic or anxiolytic dependence, uncomplicated: Secondary | ICD-10-CM

## 2021-06-23 ENCOUNTER — Other Ambulatory Visit: Payer: Self-pay | Admitting: Family Medicine

## 2021-06-23 DIAGNOSIS — Z79899 Other long term (current) drug therapy: Secondary | ICD-10-CM

## 2021-06-23 DIAGNOSIS — F419 Anxiety disorder, unspecified: Secondary | ICD-10-CM

## 2021-06-23 DIAGNOSIS — F1320 Sedative, hypnotic or anxiolytic dependence, uncomplicated: Secondary | ICD-10-CM

## 2021-07-17 ENCOUNTER — Other Ambulatory Visit: Payer: Self-pay | Admitting: Family Medicine

## 2021-07-17 DIAGNOSIS — I611 Nontraumatic intracerebral hemorrhage in hemisphere, cortical: Secondary | ICD-10-CM

## 2021-07-17 DIAGNOSIS — I10 Essential (primary) hypertension: Secondary | ICD-10-CM

## 2021-07-19 ENCOUNTER — Ambulatory Visit: Payer: Medicare Other | Admitting: Family Medicine

## 2021-08-11 ENCOUNTER — Other Ambulatory Visit: Payer: Self-pay | Admitting: Family Medicine

## 2021-08-11 DIAGNOSIS — M47812 Spondylosis without myelopathy or radiculopathy, cervical region: Secondary | ICD-10-CM

## 2021-08-14 NOTE — Telephone Encounter (Signed)
Last office visit 04/19/21 Last refill 04/24/21, #90, 1 refill

## 2021-08-17 ENCOUNTER — Ambulatory Visit (INDEPENDENT_AMBULATORY_CARE_PROVIDER_SITE_OTHER): Payer: Medicare Other | Admitting: Family Medicine

## 2021-08-17 ENCOUNTER — Encounter: Payer: Self-pay | Admitting: Family Medicine

## 2021-08-17 VITALS — BP 118/82 | HR 110 | Wt 172.0 lb

## 2021-08-17 DIAGNOSIS — M47812 Spondylosis without myelopathy or radiculopathy, cervical region: Secondary | ICD-10-CM

## 2021-08-17 DIAGNOSIS — F419 Anxiety disorder, unspecified: Secondary | ICD-10-CM

## 2021-08-17 DIAGNOSIS — E782 Mixed hyperlipidemia: Secondary | ICD-10-CM

## 2021-08-17 DIAGNOSIS — I10 Essential (primary) hypertension: Secondary | ICD-10-CM

## 2021-08-17 DIAGNOSIS — I611 Nontraumatic intracerebral hemorrhage in hemisphere, cortical: Secondary | ICD-10-CM | POA: Diagnosis not present

## 2021-08-17 DIAGNOSIS — F32A Depression, unspecified: Secondary | ICD-10-CM

## 2021-08-17 DIAGNOSIS — F1320 Sedative, hypnotic or anxiolytic dependence, uncomplicated: Secondary | ICD-10-CM

## 2021-08-17 DIAGNOSIS — E039 Hypothyroidism, unspecified: Secondary | ICD-10-CM

## 2021-08-17 DIAGNOSIS — Z79899 Other long term (current) drug therapy: Secondary | ICD-10-CM

## 2021-08-17 DIAGNOSIS — R739 Hyperglycemia, unspecified: Secondary | ICD-10-CM

## 2021-08-17 LAB — BAYER DCA HB A1C WAIVED: HB A1C (BAYER DCA - WAIVED): 9.8 % — ABNORMAL HIGH (ref 4.8–5.6)

## 2021-08-17 MED ORDER — TIZANIDINE HCL 4 MG PO TABS: 4.0000 mg | ORAL_TABLET | Freq: Three times a day (TID) | ORAL | 1 refills | Status: DC

## 2021-08-17 MED ORDER — ALPRAZOLAM 0.5 MG PO TABS: 0.5000 mg | ORAL_TABLET | Freq: Two times a day (BID) | ORAL | 2 refills | Status: DC | PRN

## 2021-08-17 MED ORDER — FENOFIBRATE 145 MG PO TABS: 145.0000 mg | ORAL_TABLET | Freq: Every day | ORAL | 3 refills | Status: DC

## 2021-08-17 MED ORDER — LEVOTHYROXINE SODIUM 75 MCG PO TABS: 75.0000 ug | ORAL_TABLET | Freq: Every day | ORAL | 3 refills | Status: DC

## 2021-08-17 MED ORDER — METOPROLOL SUCCINATE ER 25 MG PO TB24: 25.0000 mg | ORAL_TABLET | Freq: Every day | ORAL | 3 refills | Status: DC

## 2021-08-17 MED ORDER — HYDROCHLOROTHIAZIDE 25 MG PO TABS: 25.0000 mg | ORAL_TABLET | Freq: Every day | ORAL | 3 refills | Status: DC

## 2021-08-17 MED ORDER — PANTOPRAZOLE SODIUM 40 MG PO TBEC: 40.0000 mg | DELAYED_RELEASE_TABLET | Freq: Every day | ORAL | 3 refills | Status: DC

## 2021-08-17 NOTE — Progress Notes (Signed)
BP 118/82    Pulse (!) 110    Wt 172 lb (78 kg)    LMP 08/28/2016 (Approximate) Comment: Pt reports being unknown when she had her last period, but had spotting two days ago.    SpO2 97%    BMI 27.76 kg/m    Subjective:   Patient ID: Wendy Collier, female    DOB: August 09, 1965, 56 y.o.   MRN: [id number]  HPI: Wendy Collier is a 56 y.o. female presenting on 08/17/2021 for Medical Management of Chronic Issues, Hyperlipidemia, Hypothyroidism, Hypertension, Anxiety, and Depression   HPI Anxiety and insomnia Current rx-Xanax 0.5 mg twice daily as needed # meds rx-55 Effectiveness of current meds-works well for the most part Adverse reactions form meds-none  Pill count performed-No Last drug screen -08/29/2020 ( high risk q66m moderate risk q653mlow risk yearly ) U[place] drug [place] today- Yes [place]  If [place] were their any concerning findings? -Just takes oxycodone from another prescriber, concerned about mixing and working on tapering down slowly  No flowsheet data found.   Controlled substance contract signed on: Today  Hypertension Patient is currently on hydrochlorothiazide and metoprolol, and their blood pressure today is 118/82. Patient denies any lightheadedness or dizziness. Patient denies headaches, blurred vision, chest pains, shortness of breath, or weakness. Denies any side effects from medication and is content with current medication.   Hyperlipidemia Patient is coming in for recheck of his hyperlipidemia. The patient is currently taking Crestor and fenofibrate. They deny any issues with myalgias or history of liver damage from it. They deny any focal numbness or weakness or chest pain.   Hypothyroidism recheck Patient is coming in for thyroid recheck today as well. They deny any issues with hair changes or heat or cold problems or diarrhea or constipation. They deny any chest pain or palpitations. They are currently on levothyroxine 75 micrograms   Relevant  past medical, surgical, family and social history reviewed and updated as indicated. Interim medical history since our last visit reviewed. Allergies and medications reviewed and updated.  Review of Systems  Constitutional:  Negative for chills and fever.  Eyes:  Negative for redness and visual disturbance.  Respiratory:  Negative for chest tightness and shortness of breath.   Cardiovascular:  Negative for chest pain and leg swelling.  Musculoskeletal:  Negative for back pain and gait problem.  Skin:  Negative for rash.  Neurological:  Negative for dizziness, light-headedness and headaches.  Psychiatric/Behavioral:  Positive for dysphoric mood and sleep disturbance. Negative for agitation and behavioral problems. The patient is nervous/anxious.   All other systems reviewed and are negative.  Per HPI unless specifically indicated above   Allergies as of 08/17/2021       Reactions   Silver Rash   Tegaderm Tegaderm Tegaderm Tegaderm Tegaderm Tegaderm        Medication List        Accurate as of August 17, 2021  1:54 PM. If you have any questions, ask your nurse or doctor.          ALPRAZolam 0.5 MG tablet Commonly known as: XANAX Take 1 tablet (0.5 mg total) by mouth 2 (two) times daily as needed for anxiety. Put on file until patient request   benazepril 10 MG tablet Commonly known as: LOTENSIN Take 3 tablets (30 mg total) by mouth daily.   desvenlafaxine 100 MG 24 hr tablet Commonly known as: Pristiq Take 1 tablet (100 mg total) by mouth daily.   fenofibrate  145 MG tablet Commonly known as: TRICOR Take 1 tablet (145 mg total) by mouth daily.   hydrochlorothiazide 25 MG tablet Commonly known as: HYDRODIURIL Take 1 tablet (25 mg total) by mouth daily.   HYDROcodone-acetaminophen 7.5-325 MG tablet Commonly known as: NORCO Take 1 tablet by mouth 4 (four) times daily as needed.   levothyroxine 75 MCG tablet Commonly known as: SYNTHROID Take 1 tablet (75  mcg total) by mouth daily before breakfast.   metoprolol succinate 25 MG 24 hr tablet Commonly known as: TOPROL-XL Take 1 tablet (25 mg total) by mouth daily.   multivitamin with minerals tablet Take 1 tablet by mouth daily.   OLANZapine 7.5 MG tablet Commonly known as: ZYPREXA Take 1 tablet (7.5 mg total) by mouth at bedtime.   oxyCODONE 5 MG immediate release tablet Commonly known as: Oxy IR/ROXICODONE Take 5 mg by mouth 5 (five) times daily as needed.   pantoprazole 40 MG tablet Commonly known as: PROTONIX Take 1 tablet (40 mg total) by mouth at bedtime.   rosuvastatin 10 MG tablet Commonly known as: CRESTOR Take 1 tablet (10 mg total) by mouth daily.   Senna S 8.6-50 MG tablet Generic drug: senna-docusate TAKE (2) TABLETS DAILY AT BEDTIME.   tiZANidine 4 MG tablet Commonly known as: ZANAFLEX TAKE 1 TABLET BY MOUTH THREE TIMES DAILY What changed: Another medication with the same name was added. Make sure you understand how and when to take each. Changed by: Fransisca Kaufmann Kamare Caspers, MD   tiZANidine 4 MG tablet Commonly known as: ZANAFLEX Take 1 tablet (4 mg total) by mouth 3 (three) times daily. What changed: You were already taking a medication with the same name, and this prescription was added. Make sure you understand how and when to take each. Changed by: Fransisca Kaufmann Iran Rowe, MD         Objective:   BP 118/82    Pulse (!) 110    Wt 172 lb (78 kg)    LMP 08/28/2016 (Approximate) Comment: Pt reports being unknown when she had her last period, but had spotting two days ago.    SpO2 97%    BMI 27.76 kg/m   Wt Readings from Last 3 Encounters:  08/17/21 172 lb (78 kg)  04/19/21 179 lb (81.2 kg)  01/23/21 175 lb (79.4 kg)    Physical Exam Vitals and nursing note reviewed.  Constitutional:      General: She is not in acute distress.    Appearance: She is well-developed. She is not diaphoretic.  Eyes:     Conjunctiva/sclera: Conjunctivae normal.  Cardiovascular:      Rate and Rhythm: Normal rate and regular rhythm.     Heart sounds: Normal heart sounds. No murmur heard. Pulmonary:     Effort: Pulmonary effort is normal. No respiratory distress.     Breath sounds: Normal breath sounds. No wheezing.  Musculoskeletal:        General: No tenderness. Normal range of motion.  Skin:    General: Skin is warm and dry.     Findings: No rash.  Neurological:     Mental Status: She is alert and oriented to person, place, and time.     Coordination: Coordination normal.  Psychiatric:        Behavior: Behavior normal.      Assessment & Plan:   Problem List Items Addressed This Visit       Cardiovascular and Mediastinum   Essential hypertension - Primary   Relevant Medications  metoprolol succinate (TOPROL-XL) 25 MG 24 hr tablet   hydrochlorothiazide (HYDRODIURIL) 25 MG tablet   fenofibrate (TRICOR) 145 MG tablet     Endocrine   Hypothyroidism   Relevant Medications   levothyroxine (SYNTHROID) 75 MCG tablet   metoprolol succinate (TOPROL-XL) 25 MG 24 hr tablet   Other Relevant Orders   CBC with Differential/Platelet   TSH     Nervous and Auditory   ICH (intracerebral hemorrhage) (HCC)   Relevant Medications   metoprolol succinate (TOPROL-XL) 25 MG 24 hr tablet   Other Relevant Orders   CMP14+EGFR     Musculoskeletal and Integument   Osteoarthritis of neck   Relevant Medications   tiZANidine (ZANAFLEX) 4 MG tablet     Other   Anxiety and depression   Relevant Medications   ALPRAZolam (XANAX) 0.5 MG tablet   Hyperlipemia   Relevant Medications   metoprolol succinate (TOPROL-XL) 25 MG 24 hr tablet   hydrochlorothiazide (HYDRODIURIL) 25 MG tablet   fenofibrate (TRICOR) 145 MG tablet   Other Relevant Orders   Lipid panel   Controlled substance agreement signed   Relevant Medications   ALPRAZolam (XANAX) 0.5 MG tablet   Other Relevant Orders   ToxASSURE Select 13 (MW), Urine   Benzodiazepine dependence (HCC)   Relevant  Medications   ALPRAZolam (XANAX) 0.5 MG tablet   Other Visit Diagnoses     Elevated blood sugar       Relevant Orders   Bayer DCA Hb A1c Waived       Continue current medicine.  She seems to be doing well.  We will continue to taper down slowly off of the benzodiazepine.  She also gets pain medicine and there is some concern for interaction.   Follow up plan: Return in about 3 months (around 11/14/2021), or if symptoms worsen or fail to improve, for Anxiety recheck.  Counseling provided for all of the vaccine components Orders Placed This Encounter  Procedures   ToxASSURE Select 13 (MW), Urine   Lipid panel   Bayer DCA Hb A1c Waived   CBC with Differential/Platelet   CMP14+EGFR   TSH    Caryl Pina, MD [facility] 08/17/2021, 1:54 PM

## 2021-08-18 ENCOUNTER — Encounter: Payer: Self-pay | Admitting: Family Medicine

## 2021-08-18 ENCOUNTER — Other Ambulatory Visit: Payer: Self-pay

## 2021-08-18 LAB — LIPID PANEL
Chol/HDL Ratio: 3.9 ratio (ref 0.0–4.4)
Cholesterol, Total: 120 mg/dL (ref 100–199)
HDL: 31 mg/dL — ABNORMAL LOW (ref >39)
LDL Chol Calc (NIH): 48 mg/dL (ref 0–99)
Triglycerides: 264 mg/dL — ABNORMAL HIGH (ref 0–149)
VLDL Cholesterol Cal: 41 mg/dL — ABNORMAL HIGH (ref 5–40)

## 2021-08-18 LAB — CBC WITH DIFFERENTIAL/PLATELET
Basophils Absolute: 0 10*3/uL (ref 0.0–0.2)
Basos: 0 %
EOS (ABSOLUTE): 0.6 10*3/uL — ABNORMAL HIGH (ref 0.0–0.4)
Eos: 11 %
Hematocrit: 45.2 % (ref 34.0–46.6)
Hemoglobin: 14.7 g/dL (ref 11.1–15.9)
Immature Grans (Abs): 0 10*3/uL (ref 0.0–0.1)
Immature Granulocytes: 0 %
Lymphocytes Absolute: 2 10*3/uL (ref 0.7–3.1)
Lymphs: 36 %
MCH: 28.2 pg (ref 26.6–33.0)
MCHC: 32.5 g/dL (ref 31.5–35.7)
MCV: 87 fL (ref 79–97)
Monocytes Absolute: 0.4 10*3/uL (ref 0.1–0.9)
Monocytes: 8 %
Neutrophils Absolute: 2.6 10*3/uL (ref 1.4–7.0)
Neutrophils: 45 %
Platelets: 268 10*3/uL (ref 150–450)
RBC: 5.21 x10E6/uL (ref 3.77–5.28)
RDW: 12.2 % (ref 11.7–15.4)
WBC: 5.6 10*3/uL (ref 3.4–10.8)

## 2021-08-18 LAB — CMP14+EGFR
ALT: 41 IU/L — ABNORMAL HIGH (ref 0–32)
AST: 35 IU/L (ref 0–40)
Albumin/Globulin Ratio: 2.2 (ref 1.2–2.2)
Albumin: 4.8 g/dL (ref 3.8–4.9)
Alkaline Phosphatase: 124 IU/L — ABNORMAL HIGH (ref 44–121)
BUN/Creatinine Ratio: 14 (ref 9–23)
BUN: 16 mg/dL (ref 6–24)
Bilirubin Total: 0.3 mg/dL (ref 0.0–1.2)
CO2: 23 mmol/L (ref 20–29)
Calcium: 10 mg/dL (ref 8.7–10.2)
Chloride: 97 mmol/L (ref 96–106)
Creatinine, Ser: 1.14 mg/dL — ABNORMAL HIGH (ref 0.57–1.00)
Globulin, Total: 2.2 g/dL (ref 1.5–4.5)
Glucose: 406 mg/dL (ref 70–99)
Potassium: 4.1 mmol/L (ref 3.5–5.2)
Sodium: 136 mmol/L (ref 134–144)
Total Protein: 7 g/dL (ref 6.0–8.5)
eGFR: 58 mL/min/{1.73_m2} — ABNORMAL LOW (ref >59)

## 2021-08-18 LAB — TSH: TSH: 1.49 u[IU]/mL (ref 0.450–4.500)

## 2021-08-18 MED ORDER — METFORMIN HCL 500 MG PO TABS: 500.0000 mg | ORAL_TABLET | Freq: Two times a day (BID) | ORAL | 3 refills | Status: DC

## 2021-08-23 ENCOUNTER — Ambulatory Visit: Payer: Medicare Other | Admitting: Family Medicine

## 2021-08-23 LAB — TOXASSURE SELECT 13 (MW), URINE

## 2021-09-11 ENCOUNTER — Ambulatory Visit (INDEPENDENT_AMBULATORY_CARE_PROVIDER_SITE_OTHER): Payer: Medicare Other | Admitting: Family Medicine

## 2021-09-11 ENCOUNTER — Encounter: Payer: Self-pay | Admitting: Family Medicine

## 2021-09-11 DIAGNOSIS — E1169 Type 2 diabetes mellitus with other specified complication: Secondary | ICD-10-CM

## 2021-09-11 MED ORDER — METFORMIN HCL 1000 MG PO TABS: 1000.0000 mg | ORAL_TABLET | Freq: Two times a day (BID) | ORAL | 3 refills | Status: DC

## 2021-09-11 NOTE — Progress Notes (Signed)
? ?BP 121/78   Pulse 94   Ht 5\' 6"  (1.676 m)   Wt 169 lb (76.7 kg)   LMP 08/28/2016 (Approximate) Comment: Pt reports being unknown when she had her last period, but had spotting two days ago.   SpO2 97%   BMI 27.28 kg/m?   ? ?Subjective:  ? ?Patient ID: Wendy Collier, female    DOB: [birth year], 56 y.o.   MRN: [id number] ? ?HPI: ?Wendy Collier is a 56 y.o. female presenting on 09/11/2021 for New onset DM (Started Metformin about 1.66m ago) ? ? ?HPI ?Type 2 diabetes mellitus ?Patient comes in today for new diagnosis diabetes. Patient has been currently taking metformin 500 bid and she has looked up some things about diet and carbohydrates.  She did start the metformin and has been doing well in it and denies any side effects.. Patient is currently on an ACE inhibitor/ARB. Patient has not seen an ophthalmologist this year. Patient denies any issues with their feet. The symptom started onset as an adult hyperlipidemia and hypertension and hypothyroidism ARE RELATED TO DM.  Discussed getting eye exam and feet exams and checking kidneys up every year at least 1 more frequently.  Discussed diabetes education and diet and reducing carbohydrates.  She says her to make some changes and reduced her sugary beverages. ? ?Relevant past medical, surgical, family and social history reviewed and updated as indicated. Interim medical history since our last visit reviewed. ?Allergies and medications reviewed and updated. ? ?Review of Systems  ?Constitutional:  Negative for chills and fever.  ?Eyes:  Negative for visual disturbance.  ?Respiratory:  Negative for chest tightness and shortness of breath.   ?Cardiovascular:  Negative for chest pain and leg swelling.  ?Musculoskeletal:  Negative for back pain and gait problem.  ?Skin:  Negative for rash.  ?Neurological:  Negative for light-headedness and headaches.  ?Psychiatric/Behavioral:  Negative for agitation and behavioral problems.   ?All other systems reviewed and are  negative. ? ?Per HPI unless specifically indicated above ? ? ?Allergies as of 09/11/2021   ? ?   Reactions  ? Silver Rash  ? Tegaderm ?Tegaderm ?Tegaderm ?Tegaderm ?Tegaderm ?Tegaderm  ? ?  ? ?  ?Medication List  ?  ? ?  ? Accurate as of September 11, 2021  2:09 PM. If you have any questions, ask your nurse or doctor.  ?  ?  ? ?  ? ?STOP taking these medications   ? ?HYDROcodone-acetaminophen 7.5-325 MG tablet ?Commonly known as: NORCO ?Stopped by: Worthy Rancher, MD ?  ? ?  ? ?TAKE these medications   ? ?ALPRAZolam 0.5 MG tablet ?Commonly known as: Duanne Moron ?Take 1 tablet (0.5 mg total) by mouth 2 (two) times daily as needed for anxiety. Put on file until patient request ?  ?benazepril 10 MG tablet ?Commonly known as: LOTENSIN ?Take 3 tablets (30 mg total) by mouth daily. ?  ?desvenlafaxine 100 MG 24 hr tablet ?Commonly known as: Pristiq ?Take 1 tablet (100 mg total) by mouth daily. ?  ?fenofibrate 145 MG tablet ?Commonly known as: TRICOR ?Take 1 tablet (145 mg total) by mouth daily. ?  ?hydrochlorothiazide 25 MG tablet ?Commonly known as: HYDRODIURIL ?Take 1 tablet (25 mg total) by mouth daily. ?  ?levothyroxine 75 MCG tablet ?Commonly known as: SYNTHROID ?Take 1 tablet (75 mcg total) by mouth daily before breakfast. ?  ?metFORMIN 1000 MG tablet ?Commonly known as: GLUCOPHAGE ?Take 1 tablet (1,000 mg total) by mouth 2 (two) times daily with a  meal. ?What changed:  ?medication strength ?how much to take ?Changed by: Worthy Rancher, MD ?  ?metoprolol succinate 25 MG 24 hr tablet ?Commonly known as: TOPROL-XL ?Take 1 tablet (25 mg total) by mouth daily. ?  ?multivitamin with minerals tablet ?Take 1 tablet by mouth daily. ?  ?OLANZapine 7.5 MG tablet ?Commonly known as: ZYPREXA ?Take 1 tablet (7.5 mg total) by mouth at bedtime. ?  ?oxyCODONE 5 MG immediate release tablet ?Commonly known as: Oxy IR/ROXICODONE ?Take 5 mg by mouth 5 (five) times daily as needed. ?  ?pantoprazole 40 MG tablet ?Commonly known as:  PROTONIX ?Take 1 tablet (40 mg total) by mouth at bedtime. ?  ?rosuvastatin 10 MG tablet ?Commonly known as: CRESTOR ?Take 1 tablet (10 mg total) by mouth daily. ?  ?Senna S 8.6-50 MG tablet ?Generic drug: senna-docusate ?TAKE (2) TABLETS DAILY AT BEDTIME. ?  ?tiZANidine 4 MG tablet ?Commonly known as: ZANAFLEX ?TAKE 1 TABLET BY MOUTH THREE TIMES DAILY ?  ?tiZANidine 4 MG tablet ?Commonly known as: ZANAFLEX ?Take 1 tablet (4 mg total) by mouth 3 (three) times daily. ?  ? ?  ? ? ? ?Objective:  ? ?BP 121/78   Pulse 94   Ht 5\' 6"  (1.676 m)   Wt 169 lb (76.7 kg)   LMP 08/28/2016 (Approximate) Comment: Pt reports being unknown when she had her last period, but had spotting two days ago.   SpO2 97%   BMI 27.28 kg/m?   ?Wt Readings from Last 3 Encounters:  ?09/11/21 169 lb (76.7 kg)  ?08/17/21 172 lb (78 kg)  ?04/19/21 179 lb (81.2 kg)  ?  ?Physical Exam ?Vitals and nursing note reviewed.  ?Constitutional:   ?   General: She is not in acute distress. ?   Appearance: She is well-developed. She is not diaphoretic.  ?Neurological:  ?   Mental Status: She is alert and oriented to person, place, and time.  ?   Coordination: Coordination normal.  ?Psychiatric:     ?   Behavior: Behavior normal.  ? ? ? ? ?Assessment & Plan:  ? ?Problem List Items Addressed This Visit   ? ?  ? Endocrine  ? Type 2 diabetes mellitus with other specified complication ([place])  ? Relevant Medications  ? metFORMIN (GLUCOPHAGE) 1000 MG tablet  ?  ?Gave education, discussed increasing metformin and see if she tolerates.  Gave handouts on diabetes education and diet education ?Follow up plan: ?Return in about 3 months (around 12/12/2021), or if symptoms worsen or fail to improve, for Diabetes recheck. ? ?Counseling provided for all of the vaccine components ?No orders of the defined types were placed in this encounter. ? ? ?Caryl Pina, MD ?[place] ?09/11/2021, 2:09 PM ? ? ?  ?

## 2021-11-13 ENCOUNTER — Ambulatory Visit (INDEPENDENT_AMBULATORY_CARE_PROVIDER_SITE_OTHER): Payer: Medicare Other | Admitting: Family Medicine

## 2021-11-13 ENCOUNTER — Encounter: Payer: Self-pay | Admitting: Family Medicine

## 2021-11-13 VITALS — BP 118/80 | HR 88 | Temp 98.0°F | Ht 66.0 in | Wt 162.0 lb

## 2021-11-13 DIAGNOSIS — I10 Essential (primary) hypertension: Secondary | ICD-10-CM

## 2021-11-13 DIAGNOSIS — E1169 Type 2 diabetes mellitus with other specified complication: Secondary | ICD-10-CM | POA: Diagnosis not present

## 2021-11-13 DIAGNOSIS — E782 Mixed hyperlipidemia: Secondary | ICD-10-CM | POA: Diagnosis not present

## 2021-11-13 DIAGNOSIS — M47812 Spondylosis without myelopathy or radiculopathy, cervical region: Secondary | ICD-10-CM

## 2021-11-13 DIAGNOSIS — E039 Hypothyroidism, unspecified: Secondary | ICD-10-CM

## 2021-11-13 DIAGNOSIS — F419 Anxiety disorder, unspecified: Secondary | ICD-10-CM

## 2021-11-13 DIAGNOSIS — F1320 Sedative, hypnotic or anxiolytic dependence, uncomplicated: Secondary | ICD-10-CM

## 2021-11-13 DIAGNOSIS — F32A Depression, unspecified: Secondary | ICD-10-CM

## 2021-11-13 LAB — BAYER DCA HB A1C WAIVED: HB A1C (BAYER DCA - WAIVED): 5.9 % — ABNORMAL HIGH (ref 4.8–5.6)

## 2021-11-13 MED ORDER — OLANZAPINE 15 MG PO TABS: 7.5000 mg | ORAL_TABLET | Freq: Every day | ORAL | 3 refills | Status: DC

## 2021-11-13 MED ORDER — TIZANIDINE HCL 4 MG PO TABS: 4.0000 mg | ORAL_TABLET | Freq: Three times a day (TID) | ORAL | 3 refills | Status: DC

## 2021-11-13 MED ORDER — ALPRAZOLAM 0.5 MG PO TABS: 0.5000 mg | ORAL_TABLET | Freq: Two times a day (BID) | ORAL | 2 refills | Status: DC | PRN

## 2021-11-13 NOTE — Progress Notes (Signed)
BP 118/80   Pulse 88   Temp 98 F (36.7 C)   Ht _0  (1.676 m)   Wt 162 lb (73.5 kg)   LMP 08/28/2016 (Approximate) Comment: Pt reports being unknown when she had her last period, but had spotting two days ago.   SpO2 97%   BMI 26.15 kg/m    Subjective:   Patient ID: Wendy Collier, female    DOB: September 07, 1965, 56 y.o.   MRN: [id number]  HPI: Wendy Collier is a 56 y.o. female presenting on 11/13/2021 for Medical Management of Chronic Issues, Diabetes, and Hypothyroidism   HPI Anxiety and depression and insomnia recheck Patient is coming in today for anxiety and insomnia depression recheck.  She currently takes Pristiq and alprazolam and Zyprexa.  She says she is struggling with sleeping still. Current rx-Xanax 0.5 mg twice daily as needed # meds rx-50 Effectiveness of current meds-works well, would like more per month Adverse reactions form meds-none  Pill count performed-No Last drug screen -09/01/2021 ( high risk q9m moderate risk q622mlow risk yearly ) Urine drug screen today- No [place]  If yes were their any concerning findings? -She is also on an opiate daily and concerned about interaction.  No flowsheet data found.   Controlled substance contract signed on: 09/01/2021  Hypothyroidism recheck Patient is coming in for thyroid recheck today as well. They deny any issues with hair changes or heat or cold problems or diarrhea or constipation. They deny any chest pain or palpitations. They are currently on levothyroxine 75 micrograms   Hypertension Patient is currently on benazepril and hydrochlorothiazide and metoprolol, and their blood pressure today is 118/80. Patient denies any lightheadedness or dizziness. Patient denies headaches, blurred vision, chest pains, shortness of breath, or weakness. Denies any side effects from medication and is content with current medication.   Hyperlipidemia Patient is coming in for recheck of his hyperlipidemia. The  patient is currently taking Crestor. They deny any issues with myalgias or history of liver damage from it. They deny any focal numbness or weakness or chest pain.   Type 2 diabetes mellitus Patient comes in today for recheck of his diabetes. Patient has been currently taking metformin. Patient is currently on an ACE inhibitor/ARB. Patient has not seen an ophthalmologist this year. Patient denies any issues with their feet. The symptom started onset as an adult hypertension and hypothyroidism and hyperlipidemia ARE RELATED TO DM   Relevant past medical, surgical, family and social history reviewed and updated as indicated. Interim medical history since our last visit reviewed. Allergies and medications reviewed and updated.  Review of Systems  Constitutional:  Negative for chills and fever.  Eyes:  Negative for visual disturbance.  Respiratory:  Negative for chest tightness and shortness of breath.   Cardiovascular:  Negative for chest pain and leg swelling.  Musculoskeletal:  Negative for back pain and gait problem.  Skin:  Negative for rash.  Neurological:  Negative for light-headedness and headaches.  Psychiatric/Behavioral:  Positive for dysphoric mood. Negative for agitation, behavioral problems, self-injury, sleep disturbance and suicidal ideas. The patient is nervous/anxious.   All other systems reviewed and are negative.  Per HPI unless specifically indicated above   Allergies as of 11/13/2021       Reactions   Silver Rash   _1  Tegaderm        Medication List        Accurate as of Nov 13, 2021  1:29 PM.  If you have any questions, ask your nurse or doctor.          ALPRAZolam 0.5 MG tablet Commonly known as: XANAX Take 1 tablet (0.5 mg total) by mouth 2 (two) times daily as needed for anxiety. Put on file until patient request   benazepril 10 MG tablet Commonly known as: LOTENSIN Take 3 tablets (30 mg total) by mouth  daily.   desvenlafaxine 100 MG 24 hr tablet Commonly known as: Pristiq Take 1 tablet (100 mg total) by mouth daily.   fenofibrate 145 MG tablet Commonly known as: TRICOR Take 1 tablet (145 mg total) by mouth daily.   hydrochlorothiazide 25 MG tablet Commonly known as: HYDRODIURIL Take 1 tablet (25 mg total) by mouth daily.   levothyroxine 75 MCG tablet Commonly known as: SYNTHROID Take 1 tablet (75 mcg total) by mouth daily before breakfast.   metFORMIN 1000 MG tablet Commonly known as: GLUCOPHAGE Take 1 tablet (1,000 mg total) by mouth 2 (two) times daily with a meal.   metoprolol succinate 25 MG 24 hr tablet Commonly known as: TOPROL-XL Take 1 tablet (25 mg total) by mouth daily.   multivitamin with minerals tablet Take 1 tablet by mouth daily.   OLANZapine 15 MG tablet Commonly known as: ZYPREXA Take 0.5 tablets (7.5 mg total) by mouth at bedtime. What changed: medication strength Changed by: Fransisca Kaufmann Carynn Felling, MD   oxyCODONE 5 MG immediate release tablet Commonly known as: Oxy IR/ROXICODONE Take 5 mg by mouth 5 (five) times daily as needed.   pantoprazole 40 MG tablet Commonly known as: PROTONIX Take 1 tablet (40 mg total) by mouth at bedtime.   rosuvastatin 10 MG tablet Commonly known as: CRESTOR Take 1 tablet (10 mg total) by mouth daily.   Senna S 8.6-50 MG tablet Generic drug: senna-docusate TAKE (2) TABLETS DAILY AT BEDTIME.   tiZANidine 4 MG tablet Commonly known as: ZANAFLEX Take 1 tablet (4 mg total) by mouth 3 (three) times daily. What changed: Another medication with the same name was removed. Continue taking this medication, and follow the directions you see here. Changed by: Fransisca Kaufmann Angelee Bahr, MD         Objective:   BP 118/80   Pulse 88   Temp 98 F (36.7 C)   Ht _0  (1.676 m)   Wt 162 lb (73.5 kg)   LMP 08/28/2016 (Approximate) Comment: Pt reports being unknown when she had her last period, but had spotting two days ago.    SpO2 97%   BMI 26.15 kg/m   Wt Readings from Last 3 Encounters:  11/13/21 162 lb (73.5 kg)  09/11/21 169 lb (76.7 kg)  08/17/21 172 lb (78 kg)    Physical Exam Vitals and nursing note reviewed.  Constitutional:      General: She is not in acute distress.    Appearance: She is well-developed. She is not diaphoretic.  Eyes:     Conjunctiva/sclera: Conjunctivae normal.  Cardiovascular:     Rate and Rhythm: Normal rate and regular rhythm.     Heart sounds: Normal heart sounds. No murmur heard. Pulmonary:     Effort: Pulmonary effort is normal. No respiratory distress.     Breath sounds: Normal breath sounds. No wheezing.  Musculoskeletal:        General: No swelling or tenderness. Normal range of motion.  Skin:    General: Skin is warm and dry.     Findings: No rash.  Neurological:     Mental Status:  She is alert and oriented to person, place, and time.     Coordination: Coordination normal.  Psychiatric:        Mood and Affect: Mood is anxious and depressed.        Behavior: Behavior normal.      Assessment & Plan:   Problem List Items Addressed This Visit       Cardiovascular and Mediastinum   Essential hypertension     Endocrine   Hypothyroidism   Relevant Orders   Bayer DCA Hb A1c Waived   TSH   CMP14+EGFR   Type 2 diabetes mellitus with other specified complication (HCC) - Primary   Relevant Orders   Bayer DCA Hb A1c Waived   TSH   CMP14+EGFR     Musculoskeletal and Integument   Osteoarthritis of neck   Relevant Medications   tiZANidine (ZANAFLEX) 4 MG tablet     Other   Anxiety and depression   Relevant Medications   ALPRAZolam (XANAX) 0.5 MG tablet   tiZANidine (ZANAFLEX) 4 MG tablet   OLANZapine (ZYPREXA) 15 MG tablet   Hyperlipemia   Benzodiazepine dependence (HCC)   Relevant Medications   ALPRAZolam (XANAX) 0.5 MG tablet   tiZANidine (ZANAFLEX) 4 MG tablet    A1c looks good at 5.9, she is doing great, continue with diet.  Not sleeping  well at night, will switch to Zyprexa 15 mg instead of the 7.5 to see if it does better for her.  Blood pressure looks good, no other changes. Follow up plan: Return in about 3 months (around 02/13/2022), or if symptoms worsen or fail to improve, for Diabetes and hypertension and thyroid and anxiety recheck.  Counseling provided for all of the vaccine components Orders Placed This Encounter  Procedures   Bayer [place] Hb A1c Waived   TSH   CMP14+EGFR    Caryl Pina, MD [facility] 11/13/2021, 1:29 PM

## 2021-11-14 LAB — CMP14+EGFR
ALT: 44 IU/L — ABNORMAL HIGH (ref 0–32)
AST: 43 IU/L — ABNORMAL HIGH (ref 0–40)
Albumin/Globulin Ratio: 2.5 — ABNORMAL HIGH (ref 1.2–2.2)
Albumin: 5 g/dL — ABNORMAL HIGH (ref 3.8–4.9)
Alkaline Phosphatase: 70 IU/L (ref 44–121)
BUN/Creatinine Ratio: 20 (ref 9–23)
BUN: 18 mg/dL (ref 6–24)
Bilirubin Total: 0.2 mg/dL (ref 0.0–1.2)
CO2: 24 mmol/L (ref 20–29)
Calcium: 10.5 mg/dL — ABNORMAL HIGH (ref 8.7–10.2)
Chloride: 102 mmol/L (ref 96–106)
Creatinine, Ser: 0.9 mg/dL (ref 0.57–1.00)
Globulin, Total: 2 g/dL (ref 1.5–4.5)
Glucose: 91 mg/dL (ref 70–99)
Potassium: 4.4 mmol/L (ref 3.5–5.2)
Sodium: 142 mmol/L (ref 134–144)
Total Protein: 7 g/dL (ref 6.0–8.5)
eGFR: 76 mL/min/{1.73_m2} (ref >59)

## 2021-11-14 LAB — TSH: TSH: 0.702 u[IU]/mL (ref 0.450–4.500)

## 2021-11-27 ENCOUNTER — Encounter: Payer: Self-pay | Admitting: Emergency Medicine

## 2021-12-05 ENCOUNTER — Encounter: Payer: Self-pay | Admitting: Family Medicine

## 2021-12-18 ENCOUNTER — Telehealth: Payer: Self-pay | Admitting: Family Medicine

## 2021-12-19 NOTE — Telephone Encounter (Signed)
Per [facility] in [facility] pt is being prescribed oxycodone in [place], Xanax from [place] and pt is also getting other controlled medications in [place] but in a different Co than the [facility] pharmacy. Pharmacist did not tell me the name of the medications.  Will make Dr. Louanne Skye aware and call pharmacy back to inform of his recommendations.   Pt last seen here 5/22 for regular visit.  Her tox is up to date ( 08/17/21)  Xanax refilled on 5/22 #50 with 2 refills.  Oxycodone filled by historical provider on 11/24/20.

## 2021-12-20 ENCOUNTER — Encounter: Payer: Self-pay | Admitting: Family Medicine

## 2021-12-20 DIAGNOSIS — Z1231 Encounter for screening mammogram for malignant neoplasm of breast: Secondary | ICD-10-CM

## 2021-12-20 NOTE — Telephone Encounter (Signed)
Please ask the pharmacy for what ever records or things that they have, I did know that she was on the alprazolam and that she does get the oxycodone and the only other thing that I see is that she has Ambien which I another can be interactions with this and we have discussed that but if there is seeing something else that she is getting in [place] that I cannot see then please let me know.

## 2021-12-20 NOTE — Telephone Encounter (Signed)
Pharmacy will fax over documentation of where pt has been filling xanax prescription.

## 2021-12-22 ENCOUNTER — Telehealth: Payer: Self-pay | Admitting: Family Medicine

## 2021-12-22 NOTE — Telephone Encounter (Signed)
Patient calling because [facility] in [place] has not received order for her to get a mammogram. Please send.

## 2021-12-22 NOTE — Telephone Encounter (Signed)
Order was placed yesterday - faxed today after getting pcp to sign - pt aware that it has been faxed over

## 2022-01-15 ENCOUNTER — Ambulatory Visit: Payer: Medicare Other

## 2022-02-14 ENCOUNTER — Ambulatory Visit (INDEPENDENT_AMBULATORY_CARE_PROVIDER_SITE_OTHER): Payer: Medicare Other | Admitting: Family Medicine

## 2022-02-14 ENCOUNTER — Encounter: Payer: Self-pay | Admitting: Family Medicine

## 2022-02-14 VITALS — BP 123/83 | HR 94 | Temp 98.0°F | Ht 66.0 in | Wt 164.0 lb

## 2022-02-14 DIAGNOSIS — F419 Anxiety disorder, unspecified: Secondary | ICD-10-CM

## 2022-02-14 DIAGNOSIS — F1320 Sedative, hypnotic or anxiolytic dependence, uncomplicated: Secondary | ICD-10-CM

## 2022-02-14 DIAGNOSIS — F32A Depression, unspecified: Secondary | ICD-10-CM | POA: Diagnosis not present

## 2022-02-14 DIAGNOSIS — F319 Bipolar disorder, unspecified: Secondary | ICD-10-CM

## 2022-02-14 DIAGNOSIS — I10 Essential (primary) hypertension: Secondary | ICD-10-CM | POA: Diagnosis not present

## 2022-02-14 DIAGNOSIS — E1169 Type 2 diabetes mellitus with other specified complication: Secondary | ICD-10-CM | POA: Diagnosis not present

## 2022-02-14 DIAGNOSIS — E782 Mixed hyperlipidemia: Secondary | ICD-10-CM

## 2022-02-14 DIAGNOSIS — E039 Hypothyroidism, unspecified: Secondary | ICD-10-CM

## 2022-02-14 LAB — BAYER DCA HB A1C WAIVED: HB A1C (BAYER DCA - WAIVED): 6.3 % — ABNORMAL HIGH (ref 4.8–5.6)

## 2022-02-14 MED ORDER — ALPRAZOLAM 0.5 MG PO TABS: 0.5000 mg | ORAL_TABLET | Freq: Two times a day (BID) | ORAL | 2 refills | Status: DC | PRN

## 2022-02-14 MED ORDER — ROSUVASTATIN CALCIUM 10 MG PO TABS
10.0000 mg | ORAL_TABLET | Freq: Every day | ORAL | 3 refills | Status: DC
Start: 2022-02-14 — End: 2023-02-15

## 2022-02-14 MED ORDER — BENAZEPRIL HCL 10 MG PO TABS: 30.0000 mg | ORAL_TABLET | Freq: Every day | ORAL | 3 refills | Status: DC

## 2022-02-14 MED ORDER — DESVENLAFAXINE SUCCINATE ER 100 MG PO TB24
100.0000 mg | ORAL_TABLET | Freq: Every day | ORAL | 3 refills | Status: DC
Start: 2022-02-14 — End: 2022-10-11

## 2022-02-14 NOTE — Progress Notes (Signed)
BP 123/83   Pulse 94   Temp 98 F (36.7 C)   Ht '5\' 6"'  (1.676 m)   Wt 164 lb (74.4 kg)   LMP 08/28/2016 (Approximate) Comment: Pt reports being unknown when she had her last period, but had spotting two days ago.   SpO2 97%   BMI 26.47 kg/m    Subjective:   Patient ID: Wendy Collier, female    DOB: [birth year], 56 y.o.   MRN: [id number]  HPI: Wendy Collier is a 56 y.o. female presenting on 02/14/2022 for Medical Management of Chronic Issues, Diabetes, Hyperlipidemia, Hypertension, Anxiety, and Depression   HPI Type 2 diabetes mellitus Patient comes in today for recheck of his diabetes. Patient has been currently taking metformin 1000 twice a day. Patient is currently on an ACE inhibitor/ARB. Patient has not seen an ophthalmologist this year. Patient denies any issues with their feet. The symptom started onset as an adult hypertension and hyperlipidemia ARE RELATED TO DM   Hypertension Patient is currently on benazepril and hydrochlorothiazide and metoprolol, and their blood pressure today is 123/83. Patient denies any lightheadedness or dizziness. Patient denies headaches, blurred vision, chest pains, shortness of breath, or weakness. Denies any side effects from medication and is content with current medication.   Hyperlipidemia Patient is coming in for recheck of his hyperlipidemia. The patient is currently taking fenofibrate and Crestor. They deny any issues with myalgias or history of liver damage from it. They deny any focal numbness or weakness or chest pain.   Anxiety and depression recheck. Patient is coming in today for anxiety and depression recheck.  Patient is currently taking Pristiq and Zyprexa and feels like things are going well there.  She still also has alprazolam 0.5 mg twice daily as needed, number 50/month.  Urine drug screen was done last time but she had some trace hydrocodone in addition to that and there is are going to repeat today.    02/14/2022    1:23 PM  11/13/2021    1:00 PM 08/17/2021    1:30 PM 04/19/2021    1:09 PM 01/23/2021    1:54 PM  Depression screen PHQ 2/9  Decreased Interest '2 2 2 1 1  ' Down, Depressed, Hopeless 0 '1 1 1 1  ' PHQ - 2 Score '2 3 3 2 2  ' Altered sleeping '2 1 1 1 1  ' Tired, decreased energy '3 3 3 1 1  ' Change in appetite '2 2 2 1 1  ' Feeling bad or failure about yourself  0 0 0 1 1  Trouble concentrating '2 1 1 1 1  ' Moving slowly or fidgety/restless 0 0 0 0 0  Suicidal thoughts 0 0 0 0 0  PHQ-9 Score '11 10 10 7 7  ' Difficult doing work/chores Extremely dIfficult Not difficult at all        Relevant past medical, surgical, family and social history reviewed and updated as indicated. Interim medical history since our last visit reviewed. Allergies and medications reviewed and updated.  Review of Systems  Constitutional:  Negative for chills and fever.  Eyes:  Negative for visual disturbance.  Respiratory:  Negative for chest tightness and shortness of breath.   Cardiovascular:  Negative for chest pain and leg swelling.  Musculoskeletal:  Negative for back pain and gait problem.  Skin:  Negative for rash.  Neurological:  Negative for dizziness, light-headedness and headaches.  Psychiatric/Behavioral:  Positive for dysphoric mood. Negative for agitation, behavioral problems, self-injury, sleep disturbance and suicidal ideas. The patient  is nervous/anxious.   All other systems reviewed and are negative.   Per HPI unless specifically indicated above   Allergies as of 02/14/2022       Reactions   Silver Rash   Tegaderm Tegaderm Tegaderm Tegaderm Tegaderm Tegaderm        Medication List        Accurate as of February 14, 2022  1:56 PM. If you have any questions, ask your nurse or doctor.          ALPRAZolam 0.5 MG tablet Commonly known as: XANAX Take 1 tablet (0.5 mg total) by mouth 2 (two) times daily as needed for anxiety. Put on file until patient request   benazepril 10 MG tablet Commonly known  as: LOTENSIN Take 3 tablets (30 mg total) by mouth daily.   desvenlafaxine 100 MG 24 hr tablet Commonly known as: Pristiq Take 1 tablet (100 mg total) by mouth daily.   fenofibrate 145 MG tablet Commonly known as: TRICOR Take 1 tablet (145 mg total) by mouth daily.   hydrochlorothiazide 25 MG tablet Commonly known as: HYDRODIURIL Take 1 tablet (25 mg total) by mouth daily.   levothyroxine 75 MCG tablet Commonly known as: SYNTHROID Take 1 tablet (75 mcg total) by mouth daily before breakfast.   metFORMIN 1000 MG tablet Commonly known as: GLUCOPHAGE Take 1 tablet (1,000 mg total) by mouth 2 (two) times daily with a meal.   metoprolol succinate 25 MG 24 hr tablet Commonly known as: TOPROL-XL Take 1 tablet (25 mg total) by mouth daily.   multivitamin with minerals tablet Take 1 tablet by mouth daily.   OLANZapine 15 MG tablet Commonly known as: ZYPREXA Take 0.5 tablets (7.5 mg total) by mouth at bedtime.   oxyCODONE 5 MG immediate release tablet Commonly known as: Oxy IR/ROXICODONE Take 5 mg by mouth 5 (five) times daily as needed.   pantoprazole 40 MG tablet Commonly known as: PROTONIX Take 1 tablet (40 mg total) by mouth at bedtime.   rosuvastatin 10 MG tablet Commonly known as: CRESTOR Take 1 tablet (10 mg total) by mouth daily.   Senna S 8.6-50 MG tablet Generic drug: senna-docusate TAKE (2) TABLETS DAILY AT BEDTIME.   tiZANidine 4 MG tablet Commonly known as: ZANAFLEX Take 1 tablet (4 mg total) by mouth 3 (three) times daily.         Objective:   BP 123/83   Pulse 94   Temp 98 F (36.7 C)   Ht '5\' 6"'  (1.676 m)   Wt 164 lb (74.4 kg)   LMP 08/28/2016 (Approximate) Comment: Pt reports being unknown when she had her last period, but had spotting two days ago.   SpO2 97%   BMI 26.47 kg/m   Wt Readings from Last 3 Encounters:  02/14/22 164 lb (74.4 kg)  11/13/21 162 lb (73.5 kg)  09/11/21 169 lb (76.7 kg)    Physical Exam Vitals and nursing note  reviewed.  Constitutional:      General: She is not in acute distress.    Appearance: She is well-developed. She is not diaphoretic.  Eyes:     Conjunctiva/sclera: Conjunctivae normal.  Cardiovascular:     Rate and Rhythm: Normal rate and regular rhythm.     Heart sounds: Normal heart sounds. No murmur heard. Pulmonary:     Effort: Pulmonary effort is normal. No respiratory distress.     Breath sounds: Normal breath sounds. No wheezing.  Musculoskeletal:        General: No swelling  or tenderness. Normal range of motion.  Skin:    General: Skin is warm and dry.     Findings: No rash.  Neurological:     Mental Status: She is alert and oriented to person, place, and time.     Coordination: Coordination normal.  Psychiatric:        Mood and Affect: Mood is anxious and depressed.        Behavior: Behavior normal.        Thought Content: Thought content does not include suicidal ideation. Thought content does not include suicidal plan.       Assessment & Plan:   Problem List Items Addressed This Visit       Cardiovascular and Mediastinum   Essential hypertension   Relevant Medications   benazepril (LOTENSIN) 10 MG tablet   rosuvastatin (CRESTOR) 10 MG tablet   Other Relevant Orders   CMP14+EGFR   CBC with Differential/Platelet   Lipid panel     Endocrine   Hypothyroidism   Relevant Orders   TSH   Type 2 diabetes mellitus with other specified complication (HCC) - Primary   Relevant Medications   benazepril (LOTENSIN) 10 MG tablet   rosuvastatin (CRESTOR) 10 MG tablet   Other Relevant Orders   Bayer DCA Hb A1c Waived   CBC with Differential/Platelet   Lipid panel     Other   Anxiety and depression   Relevant Medications   ALPRAZolam (XANAX) 0.5 MG tablet   desvenlafaxine (PRISTIQ) 100 MG 24 hr tablet   Other Relevant Orders   ToxASSURE Select 13 (MW), Urine   Bipolar 1 disorder (HCC)   Hyperlipemia   Relevant Medications   benazepril (LOTENSIN) 10 MG tablet    rosuvastatin (CRESTOR) 10 MG tablet   Benzodiazepine dependence (HCC)   Relevant Medications   ALPRAZolam (XANAX) 0.5 MG tablet  Continue current medicine, no changes.  We will repeat urine drug screen because of something that was there that in trace amounts that should have been there.  A1c was 6.3.  No change in medication, focus on diet.  Blood pressure looks good.  Follow up plan: Return in about 3 months (around 05/17/2022), or if symptoms worsen or fail to improve, for Anxiety depression recheck.  Counseling provided for all of the vaccine components Orders Placed This Encounter  Procedures   Bayer DCA Hb A1c Waived   CMP14+EGFR   TSH   CBC with Differential/Platelet   Lipid panel   ToxASSURE Select 13 (MW), Urine    Caryl Pina, MD [facility] 02/14/2022, 1:56 PM

## 2022-02-15 LAB — CBC WITH DIFFERENTIAL/PLATELET
Basophils Absolute: 0 10*3/uL (ref 0.0–0.2)
Basos: 0 %
EOS (ABSOLUTE): 0.5 10*3/uL — ABNORMAL HIGH (ref 0.0–0.4)
Eos: 9 %
Hematocrit: 43.6 % (ref 34.0–46.6)
Hemoglobin: 14.2 g/dL (ref 11.1–15.9)
Immature Grans (Abs): 0 10*3/uL (ref 0.0–0.1)
Immature Granulocytes: 1 %
Lymphocytes Absolute: 1.8 10*3/uL (ref 0.7–3.1)
Lymphs: 30 %
MCH: 28 pg (ref 26.6–33.0)
MCHC: 32.6 g/dL (ref 31.5–35.7)
MCV: 86 fL (ref 79–97)
Monocytes Absolute: 0.5 10*3/uL (ref 0.1–0.9)
Monocytes: 9 %
Neutrophils Absolute: 3 10*3/uL (ref 1.4–7.0)
Neutrophils: 51 %
Platelets: 268 10*3/uL (ref 150–450)
RBC: 5.07 x10E6/uL (ref 3.77–5.28)
RDW: 12.4 % (ref 11.7–15.4)
WBC: 5.9 10*3/uL (ref 3.4–10.8)

## 2022-02-15 LAB — CMP14+EGFR
ALT: 44 IU/L — ABNORMAL HIGH (ref 0–32)
AST: 37 IU/L (ref 0–40)
Albumin/Globulin Ratio: 2.3 — ABNORMAL HIGH (ref 1.2–2.2)
Albumin: 4.9 g/dL (ref 3.8–4.9)
Alkaline Phosphatase: 70 IU/L (ref 44–121)
BUN/Creatinine Ratio: 17 (ref 9–23)
BUN: 18 mg/dL (ref 6–24)
Bilirubin Total: 0.3 mg/dL (ref 0.0–1.2)
CO2: 23 mmol/L (ref 20–29)
Calcium: 10.4 mg/dL — ABNORMAL HIGH (ref 8.7–10.2)
Chloride: 101 mmol/L (ref 96–106)
Creatinine, Ser: 1.06 mg/dL — ABNORMAL HIGH (ref 0.57–1.00)
Globulin, Total: 2.1 g/dL (ref 1.5–4.5)
Glucose: 104 mg/dL — ABNORMAL HIGH (ref 70–99)
Potassium: 4.6 mmol/L (ref 3.5–5.2)
Sodium: 142 mmol/L (ref 134–144)
Total Protein: 7 g/dL (ref 6.0–8.5)
eGFR: 62 mL/min/{1.73_m2} (ref >59)

## 2022-02-15 LAB — LIPID PANEL
Chol/HDL Ratio: 2.9 ratio (ref 0.0–4.4)
Cholesterol, Total: 111 mg/dL (ref 100–199)
HDL: 38 mg/dL — ABNORMAL LOW (ref >39)
LDL Chol Calc (NIH): 44 mg/dL (ref 0–99)
Triglycerides: 176 mg/dL — ABNORMAL HIGH (ref 0–149)
VLDL Cholesterol Cal: 29 mg/dL (ref 5–40)

## 2022-02-15 LAB — TSH: TSH: 0.683 u[IU]/mL (ref 0.450–4.500)

## 2022-02-19 LAB — TOXASSURE SELECT 13 (MW), URINE

## 2022-03-05 ENCOUNTER — Ambulatory Visit: Payer: Medicare Other

## 2022-03-29 ENCOUNTER — Other Ambulatory Visit: Payer: Self-pay | Admitting: Family Medicine

## 2022-03-29 DIAGNOSIS — F419 Anxiety disorder, unspecified: Secondary | ICD-10-CM

## 2022-03-29 DIAGNOSIS — M47812 Spondylosis without myelopathy or radiculopathy, cervical region: Secondary | ICD-10-CM

## 2022-03-29 DIAGNOSIS — F1320 Sedative, hypnotic or anxiolytic dependence, uncomplicated: Secondary | ICD-10-CM

## 2022-05-16 ENCOUNTER — Encounter: Payer: Self-pay | Admitting: Family Medicine

## 2022-05-16 ENCOUNTER — Ambulatory Visit (INDEPENDENT_AMBULATORY_CARE_PROVIDER_SITE_OTHER): Payer: Medicare Other | Admitting: Family Medicine

## 2022-05-16 VITALS — BP 105/74 | HR 72 | Ht 66.0 in | Wt 162.0 lb

## 2022-05-16 DIAGNOSIS — F32A Depression, unspecified: Secondary | ICD-10-CM

## 2022-05-16 DIAGNOSIS — Z23 Encounter for immunization: Secondary | ICD-10-CM

## 2022-05-16 DIAGNOSIS — F1320 Sedative, hypnotic or anxiolytic dependence, uncomplicated: Secondary | ICD-10-CM

## 2022-05-16 DIAGNOSIS — F419 Anxiety disorder, unspecified: Secondary | ICD-10-CM

## 2022-05-16 DIAGNOSIS — E782 Mixed hyperlipidemia: Secondary | ICD-10-CM

## 2022-05-16 DIAGNOSIS — E1169 Type 2 diabetes mellitus with other specified complication: Secondary | ICD-10-CM

## 2022-05-16 DIAGNOSIS — E039 Hypothyroidism, unspecified: Secondary | ICD-10-CM

## 2022-05-16 DIAGNOSIS — I10 Essential (primary) hypertension: Secondary | ICD-10-CM | POA: Diagnosis not present

## 2022-05-16 LAB — BAYER DCA HB A1C WAIVED: HB A1C (BAYER DCA - WAIVED): 5.7 % — ABNORMAL HIGH (ref 4.8–5.6)

## 2022-05-16 MED ORDER — ALPRAZOLAM 0.5 MG PO TABS: 0.5000 mg | ORAL_TABLET | Freq: Two times a day (BID) | ORAL | 2 refills | Status: DC | PRN

## 2022-05-16 NOTE — Progress Notes (Signed)
BP 105/74   Pulse 72   Ht 5\' 6"  (1.676 m)   Wt 162 lb (73.5 kg)   LMP 08/28/2016 (Approximate) Comment: Pt reports being unknown when she had her last period, but had spotting two days ago.   SpO2 96%   BMI 26.15 kg/m    Subjective:   Patient ID: 10/28/2016, female    DOB: 1965-06-27, 56 y.o.   MRN: [id number]  HPI: Wendy Collier is a 56 y.o. female presenting on 05/16/2022 for Medical Management of Chronic Issues, Diabetes, and Hypothyroidism   HPI Type 2 diabetes mellitus Patient comes in today for recheck of his diabetes. Patient has been currently taking metformin, A1c looks good. Patient is currently on an ACE inhibitor/ARB. Patient has not seen an ophthalmologist this year. Patient denies any issues with their feet. The symptom started onset as an adult hypertension and hypothyroidism hyperlipidemia ARE RELATED TO DM   Hypertension Patient is currently on metoprolol and hydrochlorothiazide and benazepril, and their blood pressure today is 105/74. Patient denies any lightheadedness or dizziness. Patient denies headaches, blurred vision, chest pains, shortness of breath, or weakness. Denies any side effects from medication and is content with current medication.   Hypothyroidism recheck Patient is coming in for thyroid recheck today as well. They deny any issues with hair changes or heat or cold problems or diarrhea or constipation. They deny any chest pain or palpitations. They are currently on levothyroxine 75 micrograms   Hyperlipidemia Patient is coming in for recheck of his hyperlipidemia. The patient is currently taking Crestor and fenofibrate. They deny any issues with myalgias or history of liver damage from it. They deny any focal numbness or weakness or chest pain.   Anxiety depression recheck Current rx-Zyprexa and Pristiq and Xanax 0.5 mg twice daily as needed # meds rx-50/month Effectiveness of current meds-works well, trying to use less and less Adverse  reactions form meds-none  Pill count performed-No Last drug screen -09/01/2021 ( high risk q21m, moderate risk q7m, low risk yearly ) Urine drug screen today- No Was the [facility] reviewed-yes  If yes were their any concerning findings? -Has prescription for opiates as well Controlled substance contract signed on: 09/01/2028  Relevant past medical, surgical, family and social history reviewed and updated as indicated. Interim medical history since our last visit reviewed. Allergies and medications reviewed and updated.  Review of Systems  Constitutional:  Negative for chills and fever.  Eyes:  Negative for redness and visual disturbance.  Respiratory:  Negative for chest tightness and shortness of breath.   Cardiovascular:  Negative for chest pain and leg swelling.  Musculoskeletal:  Negative for back pain and gait problem.  Skin:  Negative for rash.  Neurological:  Negative for light-headedness and headaches.  Psychiatric/Behavioral:  Positive for dysphoric mood. Negative for agitation, behavioral problems, self-injury, sleep disturbance and suicidal ideas. The patient is nervous/anxious.   All other systems reviewed and are negative.   Per HPI unless specifically indicated above   Allergies as of 05/16/2022       Reactions   Silver Rash   Tegaderm Tegaderm Tegaderm Tegaderm Tegaderm Tegaderm        Medication List        Accurate as of May 16, 2022  1:59 PM. If you have any questions, ask your nurse or doctor.          ALPRAZolam 0.5 MG tablet Commonly known as: XANAX Take 1 tablet (0.5 mg total) by mouth 2 (two) times daily  as needed for anxiety. Put on file until patient request Start taking on: May 29, 2022 What changed: These instructions start on May 29, 2022. If you are unsure what to do until then, ask your doctor or other care provider. Changed by: Elige Radon Kiauna Zywicki, MD   benazepril 10 MG tablet Commonly known as: LOTENSIN Take 3 tablets  (30 mg total) by mouth daily.   desvenlafaxine 100 MG 24 hr tablet Commonly known as: Pristiq Take 1 tablet (100 mg total) by mouth daily.   fenofibrate 145 MG tablet Commonly known as: TRICOR Take 1 tablet (145 mg total) by mouth daily.   hydrochlorothiazide 25 MG tablet Commonly known as: HYDRODIURIL Take 1 tablet (25 mg total) by mouth daily.   levothyroxine 75 MCG tablet Commonly known as: SYNTHROID Take 1 tablet (75 mcg total) by mouth daily before breakfast.   metFORMIN 1000 MG tablet Commonly known as: GLUCOPHAGE Take 1 tablet (1,000 mg total) by mouth 2 (two) times daily with a meal.   metoprolol succinate 25 MG 24 hr tablet Commonly known as: TOPROL-XL Take 1 tablet (25 mg total) by mouth daily.   multivitamin with minerals tablet Take 1 tablet by mouth daily.   OLANZapine 15 MG tablet Commonly known as: ZYPREXA Take 0.5 tablets (7.5 mg total) by mouth at bedtime.   oxyCODONE 5 MG immediate release tablet Commonly known as: Oxy IR/ROXICODONE Take 5 mg by mouth 5 (five) times daily as needed.   pantoprazole 40 MG tablet Commonly known as: PROTONIX Take 1 tablet (40 mg total) by mouth at bedtime.   rosuvastatin 10 MG tablet Commonly known as: CRESTOR Take 1 tablet (10 mg total) by mouth daily.   Senna S 8.6-50 MG tablet Generic drug: senna-docusate TAKE (2) TABLETS DAILY AT BEDTIME.   tiZANidine 4 MG tablet Commonly known as: ZANAFLEX TAKE 1 TABLET BY MOUTH THREE TIMES DAILY         Objective:   BP 105/74   Pulse 72   Ht 5\' 6"  (1.676 m)   Wt 162 lb (73.5 kg)   LMP 08/28/2016 (Approximate) Comment: Pt reports being unknown when she had her last period, but had spotting two days ago.   SpO2 96%   BMI 26.15 kg/m   Wt Readings from Last 3 Encounters:  05/16/22 162 lb (73.5 kg)  02/14/22 164 lb (74.4 kg)  11/13/21 162 lb (73.5 kg)    Physical Exam Vitals and nursing note reviewed.  Constitutional:      General: She is not in acute  distress.    Appearance: She is well-developed. She is not diaphoretic.  Eyes:     Conjunctiva/sclera: Conjunctivae normal.  Cardiovascular:     Rate and Rhythm: Normal rate and regular rhythm.     Heart sounds: Normal heart sounds. No murmur heard. Pulmonary:     Effort: Pulmonary effort is normal. No respiratory distress.     Breath sounds: Normal breath sounds. No wheezing.  Musculoskeletal:        General: No tenderness. Normal range of motion.  Skin:    General: Skin is warm and dry.     Findings: No rash.  Neurological:     Mental Status: She is alert and oriented to person, place, and time.     Coordination: Coordination normal.  Psychiatric:        Behavior: Behavior normal.       Assessment & Plan:   Problem List Items Addressed This Visit  Cardiovascular and Mediastinum   Essential hypertension     Endocrine   Hypothyroidism   Relevant Orders   TSH   Bayer DCA Hb A1c Waived   Type 2 diabetes mellitus with other specified complication (HCC) - Primary   Relevant Orders   TSH   Bayer DCA Hb A1c Waived   Microalbumin / creatinine urine ratio     Other   Anxiety and depression   Relevant Medications   ALPRAZolam (XANAX) 0.5 MG tablet (Start on 05/29/2022)   Hyperlipemia   Benzodiazepine dependence (HCC)   Relevant Medications   ALPRAZolam (XANAX) 0.5 MG tablet (Start on 05/29/2022)   Other Visit Diagnoses     Need for immunization against influenza       Relevant Orders   Flu Vaccine QUAD 25mo+IM (Fluarix, Fluzone & Alfiuria Quad PF) (Completed)       Continue current medicine, seems to be doing well.  A1c looks good at 5.7.  No changes Follow up plan: Return in about 3 months (around 08/16/2022), or if symptoms worsen or fail to improve, for Thyroid diabetes and anxiety.  Counseling provided for all of the vaccine components Orders Placed This Encounter  Procedures   Flu Vaccine QUAD 21mo+IM (Fluarix, Fluzone & Alfiuria Quad PF)   TSH    Bayer DCA Hb A1c Waived   Microalbumin / creatinine urine ratio    Arville Care, MD [facility] 05/16/2022, 1:59 PM

## 2022-05-17 LAB — TSH: TSH: 3.77 u[IU]/mL (ref 0.450–4.500)

## 2022-05-18 LAB — MICROALBUMIN / CREATININE URINE RATIO
Creatinine, Urine: 61.9 mg/dL
Microalb/Creat Ratio: 48 mg/g creat — ABNORMAL HIGH (ref 0–29)
Microalbumin, Urine: 29.8 ug/mL

## 2022-05-24 ENCOUNTER — Other Ambulatory Visit: Payer: Self-pay | Admitting: Family Medicine

## 2022-05-24 DIAGNOSIS — F32A Depression, unspecified: Secondary | ICD-10-CM

## 2022-05-24 DIAGNOSIS — F1320 Sedative, hypnotic or anxiolytic dependence, uncomplicated: Secondary | ICD-10-CM

## 2022-05-24 DIAGNOSIS — M47812 Spondylosis without myelopathy or radiculopathy, cervical region: Secondary | ICD-10-CM

## 2022-05-24 NOTE — Telephone Encounter (Signed)
Last OV 05/16/22. Last RF 03/30/22 #90 with 1 refill. Next OV 08/16/22

## 2022-05-30 ENCOUNTER — Telehealth: Payer: Self-pay | Admitting: *Deleted

## 2022-05-30 DIAGNOSIS — F419 Anxiety disorder, unspecified: Secondary | ICD-10-CM

## 2022-05-30 NOTE — Telephone Encounter (Signed)
Fax from [facility] RE: Olanzapine 15 mg 1/2 tab QHS Note from pharmacy: pt says she is taking 1 tab QHS Please advise/confirm and send new script

## 2022-05-31 MED ORDER — OLANZAPINE 15 MG PO TABS: 15.0000 mg | ORAL_TABLET | Freq: Every day | ORAL | 3 refills | Status: DC

## 2022-05-31 NOTE — Addendum Note (Signed)
Addended by: Arville Care on: 05/31/2022 09:20 AM   Modules accepted: Orders

## 2022-05-31 NOTE — Telephone Encounter (Signed)
Sent corrected prescription for the patient °

## 2022-06-01 ENCOUNTER — Ambulatory Visit: Payer: Medicare Other

## 2022-06-13 ENCOUNTER — Encounter: Payer: Self-pay | Admitting: Family Medicine

## 2022-06-27 ENCOUNTER — Other Ambulatory Visit: Payer: Self-pay | Admitting: Family Medicine

## 2022-06-27 DIAGNOSIS — F1320 Sedative, hypnotic or anxiolytic dependence, uncomplicated: Secondary | ICD-10-CM

## 2022-06-27 DIAGNOSIS — M47812 Spondylosis without myelopathy or radiculopathy, cervical region: Secondary | ICD-10-CM

## 2022-06-27 DIAGNOSIS — F419 Anxiety disorder, unspecified: Secondary | ICD-10-CM

## 2022-07-29 ENCOUNTER — Other Ambulatory Visit: Payer: Self-pay | Admitting: Family Medicine

## 2022-07-29 DIAGNOSIS — M47812 Spondylosis without myelopathy or radiculopathy, cervical region: Secondary | ICD-10-CM

## 2022-07-29 DIAGNOSIS — F419 Anxiety disorder, unspecified: Secondary | ICD-10-CM

## 2022-07-29 DIAGNOSIS — F1320 Sedative, hypnotic or anxiolytic dependence, uncomplicated: Secondary | ICD-10-CM

## 2022-08-13 ENCOUNTER — Other Ambulatory Visit: Payer: Self-pay | Admitting: Family Medicine

## 2022-08-13 DIAGNOSIS — E1169 Type 2 diabetes mellitus with other specified complication: Secondary | ICD-10-CM

## 2022-08-16 ENCOUNTER — Ambulatory Visit (INDEPENDENT_AMBULATORY_CARE_PROVIDER_SITE_OTHER): Payer: Medicare Other | Admitting: Family Medicine

## 2022-08-16 ENCOUNTER — Encounter: Payer: Self-pay | Admitting: Family Medicine

## 2022-08-16 VITALS — BP 100/71 | HR 77 | Ht 66.0 in | Wt 161.0 lb

## 2022-08-16 DIAGNOSIS — F32A Depression, unspecified: Secondary | ICD-10-CM

## 2022-08-16 DIAGNOSIS — I10 Essential (primary) hypertension: Secondary | ICD-10-CM

## 2022-08-16 DIAGNOSIS — F419 Anxiety disorder, unspecified: Secondary | ICD-10-CM

## 2022-08-16 DIAGNOSIS — E1169 Type 2 diabetes mellitus with other specified complication: Secondary | ICD-10-CM | POA: Diagnosis not present

## 2022-08-16 DIAGNOSIS — E039 Hypothyroidism, unspecified: Secondary | ICD-10-CM | POA: Diagnosis not present

## 2022-08-16 DIAGNOSIS — E782 Mixed hyperlipidemia: Secondary | ICD-10-CM

## 2022-08-16 DIAGNOSIS — F1320 Sedative, hypnotic or anxiolytic dependence, uncomplicated: Secondary | ICD-10-CM

## 2022-08-16 LAB — BAYER DCA HB A1C WAIVED: HB A1C (BAYER DCA - WAIVED): 6.6 % — ABNORMAL HIGH (ref 4.8–5.6)

## 2022-08-16 MED ORDER — ALPRAZOLAM 0.5 MG PO TABS: 0.5000 mg | ORAL_TABLET | Freq: Two times a day (BID) | ORAL | 2 refills | Status: DC | PRN

## 2022-08-16 NOTE — Progress Notes (Signed)
BP 100/71   Pulse 77   Ht 5' 6"$  (1.676 m)   Wt 161 lb (73 kg)   LMP 08/28/2016 (Approximate) Comment: Pt reports being unknown when she had her last period, but had spotting two days ago.   SpO2 97%   BMI 25.99 kg/m    Subjective:   Patient ID: Wendy Collier, female    DOB: 09-23-1965, 57 y.o.   MRN: [id number]  HPI: Wendy Collier is a 57 y.o. female presenting on 08/16/2022 for Medical Management of Chronic Issues, Diabetes, Hypertension, and Hypothyroidism   HPI Type 2 diabetes mellitus Patient comes in today for recheck of his diabetes. Patient has been currently taking metformin. Patient is currently on an ACE inhibitor/ARB. Patient has not seen an ophthalmologist this year. Patient denies any issues with their feet. The symptom started onset as an adult hypothyroidism and hyperlipidemia ARE RELATED TO DM   Hypothyroidism recheck Patient is coming in for thyroid recheck today as well. They deny any issues with hair changes or heat or cold problems or diarrhea or constipation. They deny any chest pain or palpitations. They are currently on levothyroxine 75 micrograms   Hyperlipidemia Patient is coming in for recheck of his hyperlipidemia. The patient is currently taking Crestor. They deny any issues with myalgias or history of liver damage from it. They deny any focal numbness or weakness or chest pain.   Anxiety recheck Current rx-alprazolam 0.5 mg twice daily as needed # meds rx-50/month Effectiveness of current meds-works well Adverse reactions form meds-none  Pill count performed-No Last drug screen -09/01/2021, alprazolam was not in her system ( high risk q51m moderate risk q627mlow risk yearly ) Urine drug screen today- Yes Was the NCL[place]ise[place]ed-yes  If yes were their any concerning findings? -She is also on oxycodone prescription  No flowsheet data found.   Controlled substance contract signed on: 09/01/2021  Relevant past medical, surgical, family and social  history reviewed and updated as indicated. Interim medical history since our last visit reviewed. Allergies and medications reviewed and updated.  Review of Systems  Constitutional:  Negative for chills and fever.  Eyes:  Negative for visual disturbance.  Respiratory:  Negative for chest tightness and shortness of breath.   Cardiovascular:  Negative for chest pain and leg swelling.  Musculoskeletal:  Negative for back pain and gait problem.  Skin:  Negative for rash.  Neurological:  Negative for dizziness, light-headedness and headaches.  Psychiatric/Behavioral:  Negative for agitation and behavioral problems.   All other systems reviewed and are negative.   Per HPI unless specifically indicated above   Allergies as of 08/16/2022       Reactions   Silver Rash   Tegaderm Tegaderm Tegaderm Tegaderm Tegaderm Tegaderm        Medication List        Accurate as of August 16, 2022  3:45 PM. If you have any questions, ask your nurse or doctor.          ALPRAZolam 0.5 MG tablet Commonly known as: XANAX Take 1 tablet (0.5 mg total) by mouth 2 (two) times daily as needed for anxiety. Put on file until patient request   benazepril 10 MG tablet Commonly known as: LOTENSIN Take 3 tablets (30 mg total) by mouth daily.   desvenlafaxine 100 MG 24 hr tablet Commonly known as: Pristiq Take 1 tablet (100 mg total) by mouth daily.   fenofibrate 145 MG tablet Commonly known as: TRICOR Take 1 tablet (145 mg total)  by mouth daily.   hydrochlorothiazide 25 MG tablet Commonly known as: HYDRODIURIL Take 1 tablet (25 mg total) by mouth daily.   levothyroxine 75 MCG tablet Commonly known as: SYNTHROID Take 1 tablet (75 mcg total) by mouth daily before breakfast.   metFORMIN 1000 MG tablet Commonly known as: GLUCOPHAGE Take 1 tablet by mouth twice daily with food   metoprolol succinate 25 MG 24 hr tablet Commonly known as: TOPROL-XL Take 1 tablet (25 mg total) by mouth  daily.   multivitamin with minerals tablet Take 1 tablet by mouth daily.   OLANZapine 15 MG tablet Commonly known as: ZYPREXA Take 1 tablet (15 mg total) by mouth at bedtime.   oxyCODONE 5 MG immediate release tablet Commonly known as: Oxy IR/ROXICODONE Take 5 mg by mouth 5 (five) times daily as needed.   pantoprazole 40 MG tablet Commonly known as: PROTONIX Take 1 tablet (40 mg total) by mouth at bedtime.   rosuvastatin 10 MG tablet Commonly known as: CRESTOR Take 1 tablet (10 mg total) by mouth daily.   Senna S 8.6-50 MG tablet Generic drug: senna-docusate TAKE (2) TABLETS DAILY AT BEDTIME.   tiZANidine 4 MG tablet Commonly known as: ZANAFLEX TAKE 1 TABLET BY MOUTH THREE TIMES DAILY         Objective:   BP 100/71   Pulse 77   Ht 5' 6"$  (1.676 m)   Wt 161 lb (73 kg)   LMP 08/28/2016 (Approximate) Comment: Pt reports being unknown when she had her last period, but had spotting two days ago.   SpO2 97%   BMI 25.99 kg/m   Wt Readings from Last 3 Encounters:  08/16/22 161 lb (73 kg)  05/16/22 162 lb (73.5 kg)  02/14/22 164 lb (74.4 kg)    Physical Exam Vitals and nursing note reviewed.  Constitutional:      General: She is not in acute distress.    Appearance: She is well-developed. She is not diaphoretic.  Eyes:     Conjunctiva/sclera: Conjunctivae normal.  Cardiovascular:     Rate and Rhythm: Normal rate and regular rhythm.     Heart sounds: Normal heart sounds. No murmur heard. Pulmonary:     Effort: Pulmonary effort is normal. No respiratory distress.     Breath sounds: Normal breath sounds. No wheezing.  Musculoskeletal:        General: No swelling or tenderness. Normal range of motion.  Skin:    General: Skin is warm and dry.     Findings: No rash.  Neurological:     Mental Status: She is alert and oriented to person, place, and time.     Coordination: Coordination normal.  Psychiatric:        Behavior: Behavior normal.       Assessment &  Plan:   Problem List Items Addressed This Visit       Cardiovascular and Mediastinum   Essential hypertension   Relevant Orders   CBC with Differential/Platelet   CMP14+EGFR   Lipid panel   Bayer DCA Hb A1c Waived (Completed)   TSH     Endocrine   Hypothyroidism   Relevant Orders   CBC with Differential/Platelet   CMP14+EGFR   Lipid panel   Bayer DCA Hb A1c Waived (Completed)   TSH   Type 2 diabetes mellitus with other specified complication ([facility]) - Primary   Relevant Orders   CBC with Differential/Platelet   CMP14+EGFR   Lipid panel   Bayer DCA Hb A1c Waived (Completed)  TSH     Other   Anxiety and depression   Relevant Medications   ALPRAZolam (XANAX) 0.5 MG tablet   Other Relevant Orders   ToxASSURE Select 13 (MW), Urine   Hyperlipemia   Relevant Orders   CBC with Differential/Platelet   CMP14+EGFR   Lipid panel   Bayer DCA Hb A1c Waived (Completed)   TSH   Benzodiazepine dependence (HCC)   Relevant Medications   ALPRAZolam (XANAX) 0.5 MG tablet   Other Relevant Orders   ToxASSURE Select 13 (MW), Urine    Continue current meds, she did have a failed drug screen from last time so repeated early.  If alprazolam is not in her system then we may have to stop the medication. Follow up plan: Return in about 3 months (around 11/14/2022), or if symptoms worsen or fail to improve, for Type 2 diabetes and hyperlipidemia and thyroid.  Counseling provided for all of the vaccine components Orders Placed This Encounter  Procedures   CBC with Differential/Platelet   CMP14+EGFR   Lipid panel   Bayer DCA Hb A1c Waived   TSH   ToxASSURE Select 13 (MW), Urine    Caryl Pina, MD [facility] 08/16/2022, 3:45 PM

## 2022-08-17 LAB — CBC WITH DIFFERENTIAL/PLATELET
Basophils Absolute: 0 10*3/uL (ref 0.0–0.2)
Basos: 0 %
EOS (ABSOLUTE): 0.4 10*3/uL (ref 0.0–0.4)
Eos: 7 %
Hematocrit: 41.9 % (ref 34.0–46.6)
Hemoglobin: 14.1 g/dL (ref 11.1–15.9)
Immature Grans (Abs): 0 10*3/uL (ref 0.0–0.1)
Immature Granulocytes: 0 %
Lymphocytes Absolute: 1.8 10*3/uL (ref 0.7–3.1)
Lymphs: 31 %
MCH: 29 pg (ref 26.6–33.0)
MCHC: 33.7 g/dL (ref 31.5–35.7)
MCV: 86 fL (ref 79–97)
Monocytes Absolute: 0.4 10*3/uL (ref 0.1–0.9)
Monocytes: 7 %
Neutrophils Absolute: 3.1 10*3/uL (ref 1.4–7.0)
Neutrophils: 55 %
Platelets: 264 10*3/uL (ref 150–450)
RBC: 4.86 x10E6/uL (ref 3.77–5.28)
RDW: 12.5 % (ref 11.7–15.4)
WBC: 5.6 10*3/uL (ref 3.4–10.8)

## 2022-08-17 LAB — LIPID PANEL
Chol/HDL Ratio: 2.8 ratio (ref 0.0–4.4)
Cholesterol, Total: 102 mg/dL (ref 100–199)
HDL: 37 mg/dL — ABNORMAL LOW (ref >39)
LDL Chol Calc (NIH): 32 mg/dL (ref 0–99)
Triglycerides: 210 mg/dL — ABNORMAL HIGH (ref 0–149)
VLDL Cholesterol Cal: 33 mg/dL (ref 5–40)

## 2022-08-17 LAB — CMP14+EGFR
ALT: 35 IU/L — ABNORMAL HIGH (ref 0–32)
AST: 36 IU/L (ref 0–40)
Albumin/Globulin Ratio: 2.6 — ABNORMAL HIGH (ref 1.2–2.2)
Albumin: 5.1 g/dL — ABNORMAL HIGH (ref 3.8–4.9)
Alkaline Phosphatase: 63 IU/L (ref 44–121)
BUN/Creatinine Ratio: 17 (ref 9–23)
BUN: 18 mg/dL (ref 6–24)
Bilirubin Total: 0.3 mg/dL (ref 0.0–1.2)
CO2: 23 mmol/L (ref 20–29)
Calcium: 10.8 mg/dL — ABNORMAL HIGH (ref 8.7–10.2)
Chloride: 103 mmol/L (ref 96–106)
Creatinine, Ser: 1.04 mg/dL — ABNORMAL HIGH (ref 0.57–1.00)
Globulin, Total: 2 g/dL (ref 1.5–4.5)
Glucose: 113 mg/dL — ABNORMAL HIGH (ref 70–99)
Potassium: 4.1 mmol/L (ref 3.5–5.2)
Sodium: 142 mmol/L (ref 134–144)
Total Protein: 7.1 g/dL (ref 6.0–8.5)
eGFR: 64 mL/min/{1.73_m2} (ref >59)

## 2022-08-17 LAB — TSH: TSH: 1.41 u[IU]/mL (ref 0.450–4.500)

## 2022-08-20 ENCOUNTER — Other Ambulatory Visit: Payer: Self-pay

## 2022-08-20 LAB — TOXASSURE SELECT 13 (MW), URINE

## 2022-08-24 ENCOUNTER — Other Ambulatory Visit: Payer: Self-pay | Admitting: Family Medicine

## 2022-08-24 DIAGNOSIS — F1320 Sedative, hypnotic or anxiolytic dependence, uncomplicated: Secondary | ICD-10-CM

## 2022-08-24 DIAGNOSIS — F419 Anxiety disorder, unspecified: Secondary | ICD-10-CM

## 2022-08-24 DIAGNOSIS — M47812 Spondylosis without myelopathy or radiculopathy, cervical region: Secondary | ICD-10-CM

## 2022-10-09 ENCOUNTER — Other Ambulatory Visit: Payer: Self-pay | Admitting: Family Medicine

## 2022-10-09 DIAGNOSIS — I10 Essential (primary) hypertension: Secondary | ICD-10-CM

## 2022-10-10 ENCOUNTER — Encounter: Payer: Self-pay | Admitting: Family Medicine

## 2022-10-11 MED ORDER — DULOXETINE HCL 60 MG PO CPEP: 60.0000 mg | ORAL_CAPSULE | Freq: Every day | ORAL | 2 refills | Status: DC

## 2022-10-25 ENCOUNTER — Other Ambulatory Visit: Payer: Self-pay | Admitting: Family Medicine

## 2022-10-25 DIAGNOSIS — F1320 Sedative, hypnotic or anxiolytic dependence, uncomplicated: Secondary | ICD-10-CM

## 2022-10-25 DIAGNOSIS — F32A Depression, unspecified: Secondary | ICD-10-CM

## 2022-10-25 DIAGNOSIS — M47812 Spondylosis without myelopathy or radiculopathy, cervical region: Secondary | ICD-10-CM

## 2022-11-15 ENCOUNTER — Encounter: Payer: Self-pay | Admitting: Family Medicine

## 2022-11-15 ENCOUNTER — Ambulatory Visit (INDEPENDENT_AMBULATORY_CARE_PROVIDER_SITE_OTHER): Payer: Medicare Other | Admitting: Family Medicine

## 2022-11-15 VITALS — BP 125/81 | HR 88 | Ht 66.0 in | Wt 168.0 lb

## 2022-11-15 DIAGNOSIS — E782 Mixed hyperlipidemia: Secondary | ICD-10-CM | POA: Diagnosis not present

## 2022-11-15 DIAGNOSIS — Z7984 Long term (current) use of oral hypoglycemic drugs: Secondary | ICD-10-CM

## 2022-11-15 DIAGNOSIS — E039 Hypothyroidism, unspecified: Secondary | ICD-10-CM | POA: Diagnosis not present

## 2022-11-15 DIAGNOSIS — I10 Essential (primary) hypertension: Secondary | ICD-10-CM | POA: Diagnosis not present

## 2022-11-15 DIAGNOSIS — I611 Nontraumatic intracerebral hemorrhage in hemisphere, cortical: Secondary | ICD-10-CM

## 2022-11-15 DIAGNOSIS — E1169 Type 2 diabetes mellitus with other specified complication: Secondary | ICD-10-CM

## 2022-11-15 LAB — BAYER DCA HB A1C WAIVED: HB A1C (BAYER DCA - WAIVED): 6.1 % — ABNORMAL HIGH (ref 4.8–5.6)

## 2022-11-15 MED ORDER — LEVOTHYROXINE SODIUM 75 MCG PO TABS: 75.0000 ug | ORAL_TABLET | Freq: Every day | ORAL | 3 refills | Status: DC

## 2022-11-15 MED ORDER — METFORMIN HCL 1000 MG PO TABS: 1000.0000 mg | ORAL_TABLET | Freq: Two times a day (BID) | ORAL | 0 refills | Status: DC

## 2022-11-15 MED ORDER — METOPROLOL SUCCINATE ER 25 MG PO TB24: 25.0000 mg | ORAL_TABLET | Freq: Every day | ORAL | 3 refills | Status: DC

## 2022-11-15 MED ORDER — FENOFIBRATE 145 MG PO TABS: 145.0000 mg | ORAL_TABLET | Freq: Every day | ORAL | 3 refills | Status: DC

## 2022-11-15 NOTE — Progress Notes (Signed)
BP 125/81   Pulse 88   Ht 5\' 6"  (1.676 m)   Wt 168 lb (76.2 kg)   LMP 08/28/2016 (Approximate) Comment: Pt reports being unknown when she had her last period, but had spotting two days ago.   SpO2 97%   BMI 27.12 kg/m    Subjective:   Patient ID: Wendy Collier, female    DOB: 1966-01-12, 57 y.o.   MRN: [id number]  HPI: Wendy Collier is a 57 y.o. female presenting on 11/15/2022 for Medical Management of Chronic Issues, Diabetes, Hypothyroidism, and Hyperlipidemia   HPI Type 2 diabetes mellitus Patient comes in today for recheck of his diabetes. Patient has been currently taking metformin. Patient is not currently on an ACE inhibitor/ARB. Patient has not seen an ophthalmologist this year. Patient denies any new issues with their feet. The symptom started onset as an adult hypothyroidism and hyperlipidemia ARE RELATED TO DM   Hyperlipidemia Patient is coming in for recheck of his hyperlipidemia. The patient is currently taking Crestor. They deny any issues with myalgias or history of liver damage from it. They deny any focal numbness or weakness or chest pain.   Hypothyroidism recheck Patient is coming in for thyroid recheck today as well. They deny any issues with hair changes or heat or cold problems or diarrhea or constipation. They deny any chest pain or palpitations. They are currently on levothyroxine 75 micrograms   Hypertension Patient is currently on benazepril and hydrochlorothiazide and metoprolol, and their blood pressure today is 125/81. Patient denies any lightheadedness or dizziness. Patient denies headaches, blurred vision, chest pains, shortness of breath, or weakness. Denies any side effects from medication and is content with current medication.   Relevant past medical, surgical, family and social history reviewed and updated as indicated. Interim medical history since our last visit reviewed. Allergies and medications reviewed and updated.  Review of Systems   Constitutional:  Negative for chills and fever.  HENT:  Negative for congestion, ear discharge and ear pain.   Eyes:  Negative for redness and visual disturbance.  Respiratory:  Negative for chest tightness and shortness of breath.   Cardiovascular:  Negative for chest pain and leg swelling.  Genitourinary:  Negative for difficulty urinating and dysuria.  Musculoskeletal:  Negative for back pain and gait problem.  Skin:  Negative for rash.  Neurological:  Negative for light-headedness and headaches.  Psychiatric/Behavioral:  Negative for agitation and behavioral problems.   All other systems reviewed and are negative.   Per HPI unless specifically indicated above   Allergies as of 11/15/2022       Reactions   Silver Rash   Tegaderm Tegaderm Tegaderm Tegaderm Tegaderm Tegaderm        Medication List        Accurate as of Nov 15, 2022  2:22 PM. If you have any questions, ask your nurse or doctor.          STOP taking these medications    ALPRAZolam 0.5 MG tablet Commonly known as: Prudy Feeler Stopped by: Elige Radon Shayden Gingrich, MD   pantoprazole 40 MG tablet Commonly known as: PROTONIX Stopped by: Elige Radon Srihan Brutus, MD       TAKE these medications    benazepril 10 MG tablet Commonly known as: LOTENSIN Take 3 tablets (30 mg total) by mouth daily.   DULoxetine 60 MG capsule Commonly known as: Cymbalta Take 1 capsule (60 mg total) by mouth daily.   fenofibrate 145 MG tablet Commonly known as: TRICOR Take 1  tablet (145 mg total) by mouth daily.   hydrochlorothiazide 25 MG tablet Commonly known as: HYDRODIURIL Take 1 tablet by mouth once daily   levothyroxine 75 MCG tablet Commonly known as: SYNTHROID Take 1 tablet (75 mcg total) by mouth daily before breakfast.   metFORMIN 1000 MG tablet Commonly known as: GLUCOPHAGE Take 1 tablet (1,000 mg total) by mouth 2 (two) times daily with a meal.   metoprolol succinate 25 MG 24 hr tablet Commonly known as:  TOPROL-XL Take 1 tablet (25 mg total) by mouth daily.   multivitamin with minerals tablet Take 1 tablet by mouth daily.   OLANZapine 15 MG tablet Commonly known as: ZYPREXA Take 1 tablet (15 mg total) by mouth at bedtime.   rosuvastatin 10 MG tablet Commonly known as: CRESTOR Take 1 tablet (10 mg total) by mouth daily.   Senna S 8.6-50 MG tablet Generic drug: senna-docusate TAKE (2) TABLETS DAILY AT BEDTIME.   tiZANidine 4 MG tablet Commonly known as: ZANAFLEX TAKE 1 TABLET BY MOUTH THREE TIMES DAILY         Objective:   BP 125/81   Pulse 88   Ht 5\' 6"  (1.676 m)   Wt 168 lb (76.2 kg)   LMP 08/28/2016 (Approximate) Comment: Pt reports being unknown when she had her last period, but had spotting two days ago.   SpO2 97%   BMI 27.12 kg/m   Wt Readings from Last 3 Encounters:  11/15/22 168 lb (76.2 kg)  08/16/22 161 lb (73 kg)  05/16/22 162 lb (73.5 kg)    Physical Exam Vitals and nursing note reviewed.  Constitutional:      General: She is not in acute distress.    Appearance: She is well-developed. She is not diaphoretic.  Eyes:     Conjunctiva/sclera: Conjunctivae normal.  Cardiovascular:     Rate and Rhythm: Normal rate and regular rhythm.     Heart sounds: Normal heart sounds. No murmur heard. Pulmonary:     Effort: Pulmonary effort is normal. No respiratory distress.     Breath sounds: Normal breath sounds. No wheezing.  Musculoskeletal:        General: No tenderness. Normal range of motion.  Skin:    General: Skin is warm and dry.     Findings: No rash.  Neurological:     Mental Status: She is alert and oriented to person, place, and time.     Coordination: Coordination normal.  Psychiatric:        Behavior: Behavior normal.       Assessment & Plan:   Problem List Items Addressed This Visit       Cardiovascular and Mediastinum   Essential hypertension   Relevant Medications   fenofibrate (TRICOR) 145 MG tablet   metoprolol succinate  (TOPROL-XL) 25 MG 24 hr tablet     Endocrine   Hypothyroidism   Relevant Medications   levothyroxine (SYNTHROID) 75 MCG tablet   metoprolol succinate (TOPROL-XL) 25 MG 24 hr tablet   Type 2 diabetes mellitus with other specified complication (HCC) - Primary   Relevant Medications   metFORMIN (GLUCOPHAGE) 1000 MG tablet   Other Relevant Orders   Bayer DCA Hb A1c Waived     Nervous and Auditory   ICH (intracerebral hemorrhage) (HCC)   Relevant Medications   metoprolol succinate (TOPROL-XL) 25 MG 24 hr tablet     Other   Hyperlipemia   Relevant Medications   fenofibrate (TRICOR) 145 MG tablet   metoprolol succinate (TOPROL-XL) 25  MG 24 hr tablet  A1c looks good at 6.1, blood pressure everything else looks good.  No changes, continue current medicine.  Follow up plan: Return in about 3 months (around 02/15/2023), or if symptoms worsen or fail to improve, for Diabetes recheck.  Counseling provided for all of the vaccine components Orders Placed This Encounter  Procedures   Bayer DCA Hb A1c Waived    Arville Care, MD [facility] 11/15/2022, 2:22 PM

## 2022-11-22 ENCOUNTER — Other Ambulatory Visit: Payer: Self-pay | Admitting: Family Medicine

## 2022-11-22 DIAGNOSIS — F419 Anxiety disorder, unspecified: Secondary | ICD-10-CM

## 2022-11-22 DIAGNOSIS — F1320 Sedative, hypnotic or anxiolytic dependence, uncomplicated: Secondary | ICD-10-CM

## 2022-11-22 DIAGNOSIS — M47812 Spondylosis without myelopathy or radiculopathy, cervical region: Secondary | ICD-10-CM

## 2023-01-15 ENCOUNTER — Other Ambulatory Visit: Payer: Self-pay | Admitting: Family Medicine

## 2023-01-26 ENCOUNTER — Other Ambulatory Visit: Payer: Self-pay | Admitting: Family Medicine

## 2023-01-26 DIAGNOSIS — F1320 Sedative, hypnotic or anxiolytic dependence, uncomplicated: Secondary | ICD-10-CM

## 2023-01-26 DIAGNOSIS — M47812 Spondylosis without myelopathy or radiculopathy, cervical region: Secondary | ICD-10-CM

## 2023-01-26 DIAGNOSIS — F32A Depression, unspecified: Secondary | ICD-10-CM

## 2023-02-15 ENCOUNTER — Encounter: Payer: Self-pay | Admitting: Family Medicine

## 2023-02-15 ENCOUNTER — Ambulatory Visit (INDEPENDENT_AMBULATORY_CARE_PROVIDER_SITE_OTHER): Payer: Medicare Other | Admitting: Family Medicine

## 2023-02-15 VITALS — BP 124/84 | HR 80 | Ht 66.0 in | Wt 168.0 lb

## 2023-02-15 DIAGNOSIS — I10 Essential (primary) hypertension: Secondary | ICD-10-CM | POA: Diagnosis not present

## 2023-02-15 DIAGNOSIS — Z23 Encounter for immunization: Secondary | ICD-10-CM

## 2023-02-15 DIAGNOSIS — E039 Hypothyroidism, unspecified: Secondary | ICD-10-CM | POA: Diagnosis not present

## 2023-02-15 DIAGNOSIS — E782 Mixed hyperlipidemia: Secondary | ICD-10-CM

## 2023-02-15 DIAGNOSIS — E1169 Type 2 diabetes mellitus with other specified complication: Secondary | ICD-10-CM | POA: Diagnosis not present

## 2023-02-15 DIAGNOSIS — Z7984 Long term (current) use of oral hypoglycemic drugs: Secondary | ICD-10-CM

## 2023-02-15 LAB — BAYER DCA HB A1C WAIVED: HB A1C (BAYER DCA - WAIVED): 6.6 % — ABNORMAL HIGH (ref 4.8–5.6)

## 2023-02-15 MED ORDER — BENAZEPRIL HCL 10 MG PO TABS
30.0000 mg | ORAL_TABLET | Freq: Every day | ORAL | 3 refills | Status: DC
Start: 2023-02-15 — End: 2024-01-16

## 2023-02-15 MED ORDER — DULOXETINE HCL 60 MG PO CPEP: 60.0000 mg | ORAL_CAPSULE | Freq: Every day | ORAL | 0 refills | Status: DC

## 2023-02-15 MED ORDER — ROSUVASTATIN CALCIUM 10 MG PO TABS: 10.0000 mg | ORAL_TABLET | Freq: Every day | ORAL | 3 refills | Status: DC

## 2023-02-15 MED ORDER — HYDROCHLOROTHIAZIDE 25 MG PO TABS
25.0000 mg | ORAL_TABLET | Freq: Every day | ORAL | 3 refills | Status: DC
Start: 2023-02-15 — End: 2024-01-16

## 2023-02-15 NOTE — Progress Notes (Signed)
BP 124/84   Pulse 80   Ht 5\' 6"  (1.676 m)   Wt 168 lb (76.2 kg)   LMP 08/28/2016 (Approximate) Comment: Pt reports being unknown when she had her last period, but had spotting two days ago.   SpO2 96%   BMI 27.12 kg/m    Subjective:   Patient ID: Wendy Collier, female    DOB: 04/13/1966, 57 y.o.   MRN: [id number]  HPI: Wendy Collier is a 57 y.o. female presenting on 02/15/2023 for Medical Management of Chronic Issues, Diabetes, Hyperlipidemia, and Hypertension   HPI Type 2 diabetes mellitus Patient comes in today for recheck of his diabetes. Patient has been currently taking metformin. Patient is currently on an ACE inhibitor/ARB. Patient has not seen an ophthalmologist this year. Patient denies any new issues with their feet. The symptom started onset as an adult hypertension and hyperlipidemia and hypothyroidism ARE RELATED TO DM   Hypothyroidism recheck Patient is coming in for thyroid recheck today as well. They deny any issues with hair changes or heat or cold problems or diarrhea or constipation. They deny any chest pain or palpitations. They are currently on levothyroxine 75 micrograms   Hypertension Patient is currently on benazepril and hydrochlorothiazide and metoprolol, and their blood pressure today is 124/84. Patient denies any lightheadedness or dizziness. Patient denies headaches, blurred vision, chest pains, shortness of breath, or weakness. Denies any side effects from medication and is content with current medication.   Hyperlipidemia Patient is coming in for recheck of his hyperlipidemia. The patient is currently taking Crestor. They deny any issues with myalgias or history of liver damage from it. They deny any focal numbness or weakness or chest pain.   Anxiety depression and bipolar recheck Patient is currently on Cymbalta and Zyprexa.  She feels like she is doing well and is very satisfied with the medicines and feels like it does good for her.  She denies any  suicidal ideations.  She denies any side effects from medication.    02/15/2023    3:05 PM 11/15/2022    2:03 PM 08/16/2022    1:10 PM 05/16/2022    1:14 PM 02/14/2022    1:23 PM  Depression screen PHQ 2/9  Decreased Interest 3 3 3 3 2   Down, Depressed, Hopeless 1 1 0 1 0  PHQ - 2 Score 4 4 3 4 2   Altered sleeping 2 1 3 2 2   Tired, decreased energy 3 3 1 3 3   Change in appetite 2 2 1 2 2   Feeling bad or failure about yourself  2 0 1 0 0  Trouble concentrating 1 0 1 2 2   Moving slowly or fidgety/restless 0 0 0 0 0  Suicidal thoughts 0 0 0 0 0  PHQ-9 Score 14 10 10 13 11   Difficult doing work/chores Extremely dIfficult Not difficult at all Extremely dIfficult Extremely dIfficult Extremely dIfficult     Relevant past medical, surgical, family and social history reviewed and updated as indicated. Interim medical history since our last visit reviewed. Allergies and medications reviewed and updated.  Review of Systems  Constitutional:  Negative for chills and fever.  Eyes:  Negative for redness and visual disturbance.  Respiratory:  Negative for chest tightness and shortness of breath.   Cardiovascular:  Negative for chest pain and leg swelling.  Genitourinary:  Negative for difficulty urinating and dysuria.  Musculoskeletal:  Negative for back pain and gait problem.  Skin:  Negative for rash.  Neurological:  Negative  for light-headedness and headaches.  Psychiatric/Behavioral:  Negative for agitation and behavioral problems.   All other systems reviewed and are negative.   Per HPI unless specifically indicated above   Allergies as of 02/15/2023       Reactions   Silver Rash   Tegaderm Tegaderm Tegaderm Tegaderm Tegaderm Tegaderm        Medication List        Accurate as of February 15, 2023  3:57 PM. If you have any questions, ask your nurse or doctor.          benazepril 10 MG tablet Commonly known as: LOTENSIN Take 3 tablets (30 mg total) by mouth daily.    DULoxetine 60 MG capsule Commonly known as: CYMBALTA Take 1 capsule (60 mg total) by mouth daily.   fenofibrate 145 MG tablet Commonly known as: TRICOR Take 1 tablet (145 mg total) by mouth daily.   hydrochlorothiazide 25 MG tablet Commonly known as: HYDRODIURIL Take 1 tablet (25 mg total) by mouth daily.   levothyroxine 75 MCG tablet Commonly known as: SYNTHROID Take 1 tablet (75 mcg total) by mouth daily before breakfast.   metFORMIN 1000 MG tablet Commonly known as: GLUCOPHAGE Take 1 tablet (1,000 mg total) by mouth 2 (two) times daily with a meal.   metoprolol succinate 25 MG 24 hr tablet Commonly known as: TOPROL-XL Take 1 tablet (25 mg total) by mouth daily.   multivitamin with minerals tablet Take 1 tablet by mouth daily.   OLANZapine 15 MG tablet Commonly known as: ZYPREXA Take 1 tablet (15 mg total) by mouth at bedtime.   rosuvastatin 10 MG tablet Commonly known as: CRESTOR Take 1 tablet (10 mg total) by mouth daily.   Senna S 8.6-50 MG tablet Generic drug: senna-docusate TAKE (2) TABLETS DAILY AT BEDTIME.   tiZANidine 4 MG tablet Commonly known as: ZANAFLEX TAKE 1 TABLET BY MOUTH THREE TIMES DAILY         Objective:   BP 124/84   Pulse 80   Ht 5\' 6"  (1.676 m)   Wt 168 lb (76.2 kg)   LMP 08/28/2016 (Approximate) Comment: Pt reports being unknown when she had her last period, but had spotting two days ago.   SpO2 96%   BMI 27.12 kg/m   Wt Readings from Last 3 Encounters:  02/15/23 168 lb (76.2 kg)  11/15/22 168 lb (76.2 kg)  08/16/22 161 lb (73 kg)    Physical Exam Vitals and nursing note reviewed.  Constitutional:      General: She is not in acute distress.    Appearance: She is well-developed. She is not diaphoretic.  Eyes:     Conjunctiva/sclera: Conjunctivae normal.  Cardiovascular:     Rate and Rhythm: Normal rate and regular rhythm.     Heart sounds: Normal heart sounds. No murmur heard. Pulmonary:     Effort: Pulmonary effort  is normal. No respiratory distress.     Breath sounds: Normal breath sounds. No wheezing.  Musculoskeletal:        General: No swelling or tenderness. Normal range of motion.  Skin:    General: Skin is warm and dry.     Findings: No rash.  Neurological:     Mental Status: She is alert and oriented to person, place, and time.     Coordination: Coordination normal.  Psychiatric:        Behavior: Behavior normal.       Assessment & Plan:   Problem List Items Addressed This Visit  Cardiovascular and Mediastinum   Essential hypertension   Relevant Medications   benazepril (LOTENSIN) 10 MG tablet   hydrochlorothiazide (HYDRODIURIL) 25 MG tablet   rosuvastatin (CRESTOR) 10 MG tablet   Other Relevant Orders   CBC with Differential/Platelet   CMP14+EGFR   Lipid panel   Bayer DCA Hb A1c Waived (Completed)     Endocrine   Hypothyroidism   Relevant Orders   CBC with Differential/Platelet   CMP14+EGFR   Lipid panel   Bayer DCA Hb A1c Waived (Completed)   TSH   Type 2 diabetes mellitus with other specified complication (HCC) - Primary   Relevant Medications   benazepril (LOTENSIN) 10 MG tablet   rosuvastatin (CRESTOR) 10 MG tablet   Other Relevant Orders   CBC with Differential/Platelet   CMP14+EGFR   Lipid panel   Bayer DCA Hb A1c Waived (Completed)     Other   Hyperlipemia   Relevant Medications   benazepril (LOTENSIN) 10 MG tablet   hydrochlorothiazide (HYDRODIURIL) 25 MG tablet   rosuvastatin (CRESTOR) 10 MG tablet   Other Relevant Orders   CBC with Differential/Platelet   CMP14+EGFR   Lipid panel   Bayer DCA Hb A1c Waived (Completed)   Other Visit Diagnoses     Need for Tdap vaccination       Relevant Orders   Tdap vaccine greater than or equal to 7yo IM (Completed)       A1c is 6.6, looks good, no changes Blood pressure and everything else looks good.  No changes Follow up plan: Return in about 3 months (around 05/18/2023), or if symptoms worsen  or fail to improve, for Diabetes hypertension cholesterol and thyroid recheck.  Counseling provided for all of the vaccine components Orders Placed This Encounter  Procedures   Tdap vaccine greater than or equal to 7yo IM   CBC with Differential/Platelet   CMP14+EGFR   Lipid panel   Bayer DCA Hb A1c Waived   TSH    Arville Care, MD [facility] 02/15/2023, 3:57 PM

## 2023-02-16 LAB — CBC WITH DIFFERENTIAL/PLATELET
Basophils Absolute: 0 10*3/uL (ref 0.0–0.2)
Basos: 0 %
EOS (ABSOLUTE): 0.4 10*3/uL (ref 0.0–0.4)
Eos: 8 %
Hematocrit: 40.3 % (ref 34.0–46.6)
Hemoglobin: 13.6 g/dL (ref 11.1–15.9)
Immature Grans (Abs): 0 10*3/uL (ref 0.0–0.1)
Immature Granulocytes: 0 %
Lymphocytes Absolute: 1.8 10*3/uL (ref 0.7–3.1)
Lymphs: 36 %
MCH: 28.9 pg (ref 26.6–33.0)
MCHC: 33.7 g/dL (ref 31.5–35.7)
MCV: 86 fL (ref 79–97)
Monocytes Absolute: 0.4 10*3/uL (ref 0.1–0.9)
Monocytes: 8 %
Neutrophils Absolute: 2.3 10*3/uL (ref 1.4–7.0)
Neutrophils: 48 %
Platelets: 270 10*3/uL (ref 150–450)
RBC: 4.7 x10E6/uL (ref 3.77–5.28)
RDW: 12.7 % (ref 11.7–15.4)
WBC: 4.9 10*3/uL (ref 3.4–10.8)

## 2023-02-16 LAB — LIPID PANEL
Chol/HDL Ratio: 3.2 ratio (ref 0.0–4.4)
Cholesterol, Total: 104 mg/dL (ref 100–199)
HDL: 33 mg/dL — ABNORMAL LOW (ref >39)
LDL Chol Calc (NIH): 35 mg/dL (ref 0–99)
Triglycerides: 232 mg/dL — ABNORMAL HIGH (ref 0–149)
VLDL Cholesterol Cal: 36 mg/dL (ref 5–40)

## 2023-02-16 LAB — CMP14+EGFR
ALT: 49 IU/L — ABNORMAL HIGH (ref 0–32)
AST: 39 IU/L (ref 0–40)
Albumin: 4.6 g/dL (ref 3.8–4.9)
Alkaline Phosphatase: 70 IU/L (ref 44–121)
BUN/Creatinine Ratio: 15 (ref 9–23)
BUN: 16 mg/dL (ref 6–24)
Bilirubin Total: 0.3 mg/dL (ref 0.0–1.2)
CO2: 24 mmol/L (ref 20–29)
Calcium: 9.9 mg/dL (ref 8.7–10.2)
Chloride: 103 mmol/L (ref 96–106)
Creatinine, Ser: 1.05 mg/dL — ABNORMAL HIGH (ref 0.57–1.00)
Globulin, Total: 2.1 g/dL (ref 1.5–4.5)
Glucose: 114 mg/dL — ABNORMAL HIGH (ref 70–99)
Potassium: 4.1 mmol/L (ref 3.5–5.2)
Sodium: 142 mmol/L (ref 134–144)
Total Protein: 6.7 g/dL (ref 6.0–8.5)
eGFR: 63 mL/min/{1.73_m2} (ref >59)

## 2023-02-16 LAB — TSH: TSH: 2.17 u[IU]/mL (ref 0.450–4.500)

## 2023-02-28 ENCOUNTER — Ambulatory Visit (INDEPENDENT_AMBULATORY_CARE_PROVIDER_SITE_OTHER): Payer: Medicare Other

## 2023-02-28 VITALS — Ht 66.0 in | Wt 168.0 lb

## 2023-02-28 DIAGNOSIS — Z0000 Encounter for general adult medical examination without abnormal findings: Secondary | ICD-10-CM

## 2023-02-28 DIAGNOSIS — E119 Type 2 diabetes mellitus without complications: Secondary | ICD-10-CM | POA: Diagnosis not present

## 2023-02-28 DIAGNOSIS — Z0001 Encounter for general adult medical examination with abnormal findings: Secondary | ICD-10-CM

## 2023-02-28 NOTE — Patient Instructions (Signed)
Wendy Collier , Thank you for taking time to come for your Medicare Wellness Visit. I appreciate your ongoing commitment to your health goals. Please review the following plan we discussed and let me know if I can assist you in the future.   Referrals/Orders/Follow-Ups/Clinician Recommendations: Aim for 30 minutes of exercise or brisk walking, 6-8 glasses of water, and 5 servings of fruits and vegetables each day.   This is a list of the screening recommended for you and due dates:  Health Maintenance  Topic Date Due   Zoster (Shingles) Vaccine (1 of 2) Never done   Flu Shot  01/24/2023   Mammogram  02/20/2023   Pap Smear  04/12/2023   COVID-19 Vaccine (1) 03/03/2023*   Hepatitis C Screening  05/17/2023*   Eye exam for diabetics  08/19/2023*   Yearly kidney health urinalysis for diabetes  05/17/2023   Hemoglobin A1C  08/18/2023   Complete foot exam   11/15/2023   Yearly kidney function blood test for diabetes  02/15/2024   Medicare Annual Wellness Visit  02/28/2024   Colon Cancer Screening  10/24/2024   DTaP/Tdap/Td vaccine (2 - Td or Tdap) 02/14/2033   HIV Screening  Completed   HPV Vaccine  Aged Out  *Topic was postponed. The date shown is not the original due date.    Advanced directives: (Provided) Advance directive discussed with you today. I have provided a copy for you to complete at home and have notarized. Once this is complete, please bring a copy in to our office so we can scan it into your chart. Information on Advanced Care Planning can be found at [facility] Advance Health Care Directives Advance Health Care Directives ([email or web address]    Next Medicare Annual Wellness Visit scheduled for next year: Yes  insert Preventive Care Attachment Reference

## 2023-02-28 NOTE — Progress Notes (Signed)
Subjective:   Wendy Collier is a 57 y.o. female who presents for Medicare Annual (Subsequent) preventive examination.  Visit Complete: Virtual  I connected with  [name] on 02/28/23 by a audio enabled telemedicine application and verified that I am speaking with the correct person using two identifiers.  Patient Location: Home  Provider Location: Home Office  I discussed the limitations of evaluation and management by telemedicine. The patient expressed understanding and agreed to proceed.  Patient Medicare AWV questionnaire was completed by the patient on 02/28/2023; I have confirmed that all information answered by patient is correct and no changes since this date.  Review of Systems    Vital Signs: Unable to obtain new vitals due to this being a telehealth visit.  Cardiac Risk Factors include: advanced age (>67men, >65 women);diabetes mellitus;dyslipidemia;hypertension;sedentary lifestyle Nutrition Risk Assessment:  Has the patient had any N/V/D within the last 2 months?  No  Does the patient have any non-healing wounds?  No  Has the patient had any unintentional weight loss or weight gain?  No   Diabetes:  Is the patient diabetic?  Yes  If diabetic, was a CBG obtained today?  No  Did the patient bring in their glucometer from home?  No  How often do you monitor your CBG's? Weekly .   Financial Strains and Diabetes Management:  Are you having any financial strains with the device, your supplies or your medication? No .  Does the patient want to be seen by Chronic Care Management for management of their diabetes?  No  Would the patient like to be referred to a Nutritionist or for Diabetic Management?  No   Diabetic Exams:  Diabetic Eye Exam: Overdue for diabetic eye exam. Pt has been advised about the importance in completing this exam. Patient advised to call and schedule an eye exam. Diabetic Foot Exam: Overdue, Pt has been advised about the importance in completing  this exam. Pt is scheduled for diabetic foot exam on next office visit .     Objective:    Today's Vitals   02/28/23 1051  Weight: 168 lb (76.2 kg)  Height: 5\' 6"  (1.676 m)   Body mass index is 27.12 kg/m.     02/28/2023   10:55 AM 01/12/2021    9:27 AM 12/03/2019   12:46 PM 12/02/2018    2:44 PM 11/17/2018    7:15 PM 11/15/2018    3:50 PM 11/14/2018    2:12 PM  Advanced Directives  Does Patient Have a Medical Advance Directive? No No No No No  No  Would patient like information on creating a medical advance directive? Yes (MAU/Ambulatory/Procedural Areas - Information given) No - Patient declined No - Patient declined No - Patient declined No - Patient declined No - Patient declined     Current Medications (verified) Outpatient Encounter Medications as of 02/28/2023  Medication Sig   benazepril (LOTENSIN) 10 MG tablet Take 3 tablets (30 mg total) by mouth daily.   DULoxetine (CYMBALTA) 60 MG capsule Take 1 capsule (60 mg total) by mouth daily.   fenofibrate (TRICOR) 145 MG tablet Take 1 tablet (145 mg total) by mouth daily.   hydrochlorothiazide (HYDRODIURIL) 25 MG tablet Take 1 tablet (25 mg total) by mouth daily.   levothyroxine (SYNTHROID) 75 MCG tablet Take 1 tablet (75 mcg total) by mouth daily before breakfast.   metFORMIN (GLUCOPHAGE) 1000 MG tablet Take 1 tablet (1,000 mg total) by mouth 2 (two) times daily with a meal.  metoprolol succinate (TOPROL-XL) 25 MG 24 hr tablet Take 1 tablet (25 mg total) by mouth daily.   Multiple Vitamins-Minerals (MULTIVITAMIN WITH MINERALS) tablet Take 1 tablet by mouth daily.   OLANZapine (ZYPREXA) 15 MG tablet Take 1 tablet (15 mg total) by mouth at bedtime.   rosuvastatin (CRESTOR) 10 MG tablet Take 1 tablet (10 mg total) by mouth daily.   SENNA S 8.6-50 MG tablet TAKE (2) TABLETS DAILY AT BEDTIME.   tiZANidine (ZANAFLEX) 4 MG tablet TAKE 1 TABLET BY MOUTH THREE TIMES DAILY   No facility-administered encounter medications on file as of  02/28/2023.    Allergies (verified) Silver   History: Past Medical History:  Diagnosis Date   Anxiety disorder    with panic attacks.    Arthritis    Bipolar 1 disorder (HCC)    Chronic fatigue    Chronic pain    neck/back with spasms.    Depression    Fibromyalgia    Frequent headaches    Hypertension    Thyroid disease    Hypothyroidism    Past Surgical History:  Procedure Laterality Date   ANTERIOR CERVICAL DECOMP/DISCECTOMY FUSION     BACK SURGERY     KNEE SURGERY     Family History  Problem Relation Age of Onset   Alcohol abuse Mother    COPD Mother    Depression Mother    Drug abuse Mother    Early death Mother    Mental illness Mother    Alcohol abuse Father    Cancer Father        kidney   Mental illness Father    Mental illness Brother        schizophrenia   Social History   Socioeconomic History   Marital status: Married    Spouse name: Onalee Hua   Number of children: 1   Years of education: 15   Highest education level: Some college, no degree  Occupational History   Occupation: disability  Tobacco Use   Smoking status: Never   Smokeless tobacco: Never  Vaping Use   Vaping status: Never Used  Substance and Sexual Activity   Alcohol use: No   Drug use: No   Sexual activity: Yes    Birth control/protection: Post-menopausal    Comment: Married since 2010  Other Topics Concern   Not on file  Social History Narrative   Lives home with her husband   Social Determinants of Health   Financial Resource Strain: Low Risk  (02/28/2023)   Overall Financial Resource Strain (CARDIA)    Difficulty of Paying Living Expenses: Not hard at all  Food Insecurity: No Food Insecurity (02/28/2023)   Hunger Vital Sign    Worried About Running Out of Food in the Last Year: Never true    Ran Out of Food in the Last Year: Never true  Transportation Needs: No Transportation Needs (02/28/2023)   PRAPARE - Administrator, Civil Service (Medical): No    Lack  of Transportation (Non-Medical): No  Physical Activity: Inactive (02/28/2023)   Exercise Vital Sign    Days of Exercise per Week: 0 days    Minutes of Exercise per Session: 0 min  Stress: No Stress Concern Present (02/28/2023)   [employer] of Occupational Health - Occupational Stress Questionnaire    Feeling of Stress : Not at all  Social Connections: Moderately Isolated (02/28/2023)   Social Connection and Isolation Panel [NHANES]    Frequency of Communication with Friends and Family:  More than three times a week    Frequency of Social Gatherings with Friends and Family: More than three times a week    Attends Religious Services: Never    Database administrator or Organizations: No    Attends Engineer, structural: Never    Marital Status: Married    Tobacco Counseling Counseling given: Not Answered   Clinical Intake:  Pre-visit preparation completed: Yes  Pain : No/denies pain     Nutritional Risks: None Diabetes: Yes CBG done?: No Did pt. bring in CBG monitor from home?: No  How often do you need to have someone help you when you read instructions, pamphlets, or other written materials from your doctor or pharmacy?: 1 - Never  Interpreter Needed?: No  Information entered by :: Renie Ora, LPN   Activities of Daily Living    02/28/2023   10:55 AM  In your present state of health, do you have any difficulty performing the following activities:  Hearing? 0  Vision? 0  Difficulty concentrating or making decisions? 0  Walking or climbing stairs? 0  Dressing or bathing? 0  Doing errands, shopping? 0  Preparing Food and eating ? N  Using the Toilet? N  In the past six months, have you accidently leaked urine? N  Do you have problems with loss of bowel control? N  Managing your Medications? N  Managing your Finances? N  Housekeeping or managing your Housekeeping? N    Patient Care Team: Dettinger, Elige Radon, MD as PCP - General (Family  Medicine) Frederich Balding, MD as Referring Physician (Neurology)  Indicate any recent Medical Services you may have received from other than [facility] providers in the past year (date may be approximate).     Assessment:   This is a routine wellness examination for Saquana.  Hearing/Vision screen Vision Screening - Comments:: Referral 02/28/2023   Goals Addressed             This Visit's Progress    Patient Stated   On track    12/03/2019 AWV Goal: Fall Prevention  Over the next year, patient will decrease their risk for falls by: Using assistive devices, such as a cane or walker, as needed Identifying fall risks within their home and correcting them by: Removing throw rugs Adding handrails to stairs or ramps Removing clutter and keeping a clear pathway throughout the home Increasing light, especially at night Adding shower handles/bars Raising toilet seat Identifying potential personal risk factors for falls: Medication side effects Incontinence/urgency Vestibular dysfunction Hearing loss Musculoskeletal disorders Neurological disorders Orthostatic hypotension         Depression Screen    02/28/2023   10:54 AM 02/15/2023    3:05 PM 11/15/2022    2:03 PM 08/16/2022    1:10 PM 05/16/2022    1:14 PM 02/14/2022    1:23 PM 11/13/2021    1:00 PM  PHQ 2/9 Scores  PHQ - 2 Score 0 4 4 3 4 2 3   PHQ- 9 Score 0 14 10 10 13 11 10     Fall Risk    02/28/2023   10:53 AM 02/15/2023    3:04 PM 11/15/2022    2:02 PM 08/16/2022    1:10 PM 05/16/2022    1:14 PM  Fall Risk   Falls in the past year? 0 0 0 0 0  Number falls in past yr: 0      Injury with Fall? 0      Risk for fall  due to : No Fall Risks      Follow up Falls prevention discussed        MEDICARE RISK AT HOME: Medicare Risk at Home Any stairs in or around the home?: No If so, are there any without handrails?: No Home free of loose throw rugs in walkways, pet beds, electrical cords, etc?: Yes Adequate lighting in  your home to reduce risk of falls?: Yes Life alert?: No Use of a cane, walker or w/c?: No Grab bars in the bathroom?: Yes Shower chair or bench in shower?: Yes Elevated toilet seat or a handicapped toilet?: Yes  TIMED UP AND GO:  Was the test performed?  No    Cognitive Function:        02/28/2023   10:56 AM 12/03/2019   12:49 PM 12/02/2018    2:50 PM  6CIT Screen  What Year? 0 points 0 points 0 points  What month? 0 points 0 points 0 points  What time? 0 points 0 points 0 points  Count back from 20 0 points 0 points 0 points  Months in reverse 0 points 0 points 0 points  Repeat phrase 0 points 2 points 2 points  Total Score 0 points 2 points 2 points    Immunizations Immunization History  Administered Date(s) Administered   Influenza,inj,Quad PF,6+ Mos 04/16/2019, 04/19/2021, 05/16/2022   Tdap 02/15/2023    TDAP status: Up to date  Flu Vaccine status: Up to date  Pneumococcal vaccine status: Due, Education has been provided regarding the importance of this vaccine. Advised may receive this vaccine at local pharmacy or Health Dept. Aware to provide a copy of the vaccination record if obtained from local pharmacy or Health Dept. Verbalized acceptance and understanding.  Covid-19 vaccine status: Declined, Education has been provided regarding the importance of this vaccine but patient still declined. Advised may receive this vaccine at local pharmacy or Health Dept.or vaccine clinic. Aware to provide a copy of the vaccination record if obtained from local pharmacy or Health Dept. Verbalized acceptance and understanding.  Qualifies for Shingles Vaccine? No   Zostavax completed No   Shingrix Completed?: No.    Education has been provided regarding the importance of this vaccine. Patient has been advised to call insurance company to determine out of pocket expense if they have not yet received this vaccine. Advised may also receive vaccine at local pharmacy or Health Dept.  Verbalized acceptance and understanding.  Screening Tests Health Maintenance  Topic Date Due   Zoster Vaccines- Shingrix (1 of 2) Never done   INFLUENZA VACCINE  01/24/2023   MAMMOGRAM  02/20/2023   PAP SMEAR-Modifier  04/12/2023   COVID-19 Vaccine (1) 03/03/2023 (Originally 12/03/1970)   Hepatitis C Screening  05/17/2023 (Originally 12/03/1985)   OPHTHALMOLOGY EXAM  08/19/2023 (Originally 12/03/1977)   Diabetic kidney evaluation - Urine ACR  05/17/2023   HEMOGLOBIN A1C  08/18/2023   FOOT EXAM  11/15/2023   Diabetic kidney evaluation - eGFR measurement  02/15/2024   Medicare Annual Wellness (AWV)  02/28/2024   Colonoscopy  10/24/2024   DTaP/Tdap/Td (2 - Td or Tdap) 02/14/2033   HIV Screening  Completed   HPV VACCINES  Aged Out    Health Maintenance  Health Maintenance Due  Topic Date Due   Zoster Vaccines- Shingrix (1 of 2) Never done   INFLUENZA VACCINE  01/24/2023   MAMMOGRAM  02/20/2023   PAP SMEAR-Modifier  04/12/2023    Colorectal cancer screening: Type of screening: Colonoscopy. Completed 10/25/2014. Repeat every  10 years  Mammogram status: Completed 02/19/2022. Repeat every year  Bone Density status: Ordered not of age . Pt provided with contact info and advised to call to schedule appt.  Lung Cancer Screening: (Low Dose CT Chest recommended if Age 52-80 years, 20 pack-year currently smoking OR have quit w/in 15years.) does not qualify.   Lung Cancer Screening Referral: n/a  Additional Screening:  Hepatitis C Screening: does not qualify;   Vision Screening: Recommended annual ophthalmology exams for early detection of glaucoma and other disorders of the eye. Is the patient up to date with their annual eye exam?  No  Who is the provider or what is the name of the office in which the patient attends annual eye exams? Referral 02/28/2023 If pt is not established with a provider, would they like to be referred to a provider to establish care? No .   Dental  Screening: Recommended annual dental exams for proper oral hygiene   Community Resource Referral / Chronic Care Management: CRR required this visit?  No   CCM required this visit?  No     Plan:     I have personally reviewed and noted the following in the patient's chart:   Medical and social history Use of alcohol, tobacco or illicit drugs  Current medications and supplements including opioid prescriptions. Patient is not currently taking opioid prescriptions. Functional ability and status Nutritional status Physical activity Advanced directives List of other physicians Hospitalizations, surgeries, and ER visits in previous 12 months Vitals Screenings to include cognitive, depression, and falls Referrals and appointments  In addition, I have reviewed and discussed with patient certain preventive protocols, quality metrics, and best practice recommendations. A written personalized care plan for preventive services as well as general preventive health recommendations were provided to patient.     Lorrene Reid, LPN   06/29/7827   After Visit Summary: (MyChart) Due to this being a telephonic visit, the after visit summary with patients personalized plan was offered to patient via MyChart   Nurse Notes: none

## 2023-03-13 ENCOUNTER — Telehealth: Payer: Self-pay | Admitting: Family Medicine

## 2023-03-13 NOTE — Patient Instructions (Signed)

## 2023-03-21 ENCOUNTER — Encounter: Payer: Self-pay | Admitting: Nurse Practitioner

## 2023-03-21 ENCOUNTER — Ambulatory Visit (INDEPENDENT_AMBULATORY_CARE_PROVIDER_SITE_OTHER): Payer: Medicare Other | Admitting: Nurse Practitioner

## 2023-03-21 ENCOUNTER — Other Ambulatory Visit (HOSPITAL_COMMUNITY)
Admission: RE | Admit: 2023-03-21 | Discharge: 2023-03-21 | Disposition: A | Discharge status: Home / Self Care | Payer: Medicare Other | Source: Ambulatory Visit | Attending: Nurse Practitioner | Admitting: Nurse Practitioner

## 2023-03-21 VITALS — BP 108/76 | HR 82 | Temp 97.7°F | Resp 20 | Ht 66.0 in | Wt 169.0 lb

## 2023-03-21 DIAGNOSIS — Z01419 Encounter for gynecological examination (general) (routine) without abnormal findings: Secondary | ICD-10-CM | POA: Insufficient documentation

## 2023-03-21 DIAGNOSIS — Z1151 Encounter for screening for human papillomavirus (HPV): Secondary | ICD-10-CM | POA: Diagnosis not present

## 2023-03-21 DIAGNOSIS — Z23 Encounter for immunization: Secondary | ICD-10-CM

## 2023-03-21 DIAGNOSIS — Z0000 Encounter for general adult medical examination without abnormal findings: Secondary | ICD-10-CM

## 2023-03-21 NOTE — Progress Notes (Signed)
Subjective:    Patient ID: Wendy Collier, female    DOB: 08-May-1966, 57 y.o.   MRN: [id number]   Chief Complaint: Annual Exam   HPI Patient  is regular patient of DR. Dettinger. She is here for CPE with PAP. SHe wanted a female  to do her pap. She last saw DR. Dettinger on 02/15/23. She is doing well today with nom complaints.  Patient Active Problem List   Diagnosis Date Noted   Type 2 diabetes mellitus with other specified complication (HCC) 09/11/2021   Hyperlipemia 12/30/2019   Benzodiazepine dependence (HCC) 09/17/2019   Cerebral aneurysm 04/16/2019   Controlled substance agreement signed 12/12/2018   Attention deficit hyperactivity disorder (ADHD)    Bipolar 1 disorder (HCC)    Essential hypertension    Intraparenchymal hematoma of brain (HCC) 11/17/2018   ICH (intracerebral hemorrhage) (HCC) 11/14/2018   History of arthroscopy of knee 07/07/2018   Complex tear of medial meniscus of right knee as current injury 06/02/2018   Sciatica, right side 06/02/2018   Vitamin D deficiency 06/02/2018   Cervical radiculopathy 12/17/2017   Displacement of cervical intervertebral disc 08/13/2017   Fibromyalgia 08/13/2017   Moderate depressed bipolar I disorder (HCC) 08/13/2017   Severe recurrent major depression without psychotic features (HCC) 08/13/2017   Other specified postprocedural states 04/03/2017   Derangement of other medial meniscus due to old tear or injury, left knee 02/28/2017   Hypothyroidism 09/10/2016   Osteoarthritis of neck 09/10/2016   Anxiety and depression 09/10/2016       Review of Systems  Constitutional:  Negative for diaphoresis.  Eyes:  Negative for pain.  Respiratory:  Negative for shortness of breath.   Cardiovascular:  Negative for chest pain, palpitations and leg swelling.  Gastrointestinal:  Negative for abdominal pain.  Endocrine: Negative for polydipsia.  Skin:  Negative for rash.  Neurological:  Negative for dizziness, weakness and  headaches.  Hematological:  Does not bruise/bleed easily.  All other systems reviewed and are negative.      Objective:   Physical Exam Vitals and nursing note reviewed.  Constitutional:      General: She is not in acute distress.    Appearance: Normal appearance. She is well-developed.  HENT:     Head: Normocephalic.     Right Ear: Tympanic membrane normal.     Left Ear: Tympanic membrane normal.     Nose: Nose normal.     Mouth/Throat:     Mouth: Mucous membranes are moist.  Eyes:     Pupils: Pupils are equal, round, and reactive to light.  Neck:     Vascular: No carotid bruit or JVD.  Cardiovascular:     Rate and Rhythm: Normal rate and regular rhythm.     Heart sounds: Normal heart sounds.  Pulmonary:     Effort: Pulmonary effort is normal. No respiratory distress.     Breath sounds: Normal breath sounds. No wheezing or rales.  Chest:     Chest wall: No tenderness.  Abdominal:     General: Bowel sounds are normal. There is no distension or abdominal bruit.     Palpations: Abdomen is soft. There is no hepatomegaly, splenomegaly, mass or pulsatile mass.     Tenderness: There is no abdominal tenderness.  Genitourinary:    General: Normal vulva.     Vagina: No vaginal discharge.     Rectum: Normal.     Comments: Cervix parous and pink No adnexal masses or tenderness Musculoskeletal:  General: Normal range of motion.     Cervical back: Normal range of motion and neck supple.  Lymphadenopathy:     Cervical: No cervical adenopathy.  Skin:    General: Skin is warm and dry.  Neurological:     Mental Status: She is alert and oriented to person, place, and time.     Deep Tendon Reflexes: Reflexes are normal and symmetric.  Psychiatric:        Behavior: Behavior normal.        Thought Content: Thought content normal.        Judgment: Judgment normal.     BP 108/76   Pulse 82   Temp 97.7 F (36.5 C) (Temporal)   Resp 20   Ht 5\' 6"  (1.676 m)   Wt 169 lb  (76.7 kg)   LMP 08/28/2016 (Approximate) Comment: Pt reports being unknown when she had her last period, but had spotting two days ago.   SpO2 96%   BMI 27.28 kg/m        Assessment & Plan:   Wendy Collier comes in today with chief complaint of Annual Exam   Diagnosis and orders addressed:  1. Annual physical exam Results will be sent  to patients my chart Keep follow up appointment with DR. Dettinger - Cytology - PAP   Labs pending Health Maintenance reviewed Diet and exercise encouraged  Follow up plan: prn   Mary-Margaret Daphine Deutscher, FNP

## 2023-03-25 ENCOUNTER — Ambulatory Visit: Payer: Medicare Other

## 2023-03-25 ENCOUNTER — Other Ambulatory Visit: Payer: Self-pay | Admitting: Family Medicine

## 2023-03-25 DIAGNOSIS — F1320 Sedative, hypnotic or anxiolytic dependence, uncomplicated: Secondary | ICD-10-CM

## 2023-03-25 DIAGNOSIS — M47812 Spondylosis without myelopathy or radiculopathy, cervical region: Secondary | ICD-10-CM

## 2023-03-25 DIAGNOSIS — F419 Anxiety disorder, unspecified: Secondary | ICD-10-CM

## 2023-03-25 LAB — HM DIABETES EYE EXAM

## 2023-03-25 NOTE — Progress Notes (Unsigned)
Wendy Collier arrived 03/25/2023 and has given verbal consent to obtain images and complete their overdue diabetic retinal screening.  The images have been sent to an ophthalmologist or optometrist for review and interpretation.  Results will be sent back to Dettinger, Elige Radon, MD for review.  Patient has been informed they will be contacted when we receive the results via telephone or MyChart

## 2023-03-26 LAB — CYTOLOGY - PAP
Comment: NEGATIVE
Diagnosis: NEGATIVE
High risk HPV: NEGATIVE

## 2023-03-31 ENCOUNTER — Encounter: Payer: Self-pay | Admitting: Family Medicine

## 2023-04-01 ENCOUNTER — Other Ambulatory Visit: Payer: Self-pay

## 2023-04-01 DIAGNOSIS — Z1231 Encounter for screening mammogram for malignant neoplasm of breast: Secondary | ICD-10-CM

## 2023-04-01 NOTE — Telephone Encounter (Signed)
Yes please place mammo order for this location for her

## 2023-04-25 ENCOUNTER — Other Ambulatory Visit: Payer: Self-pay | Admitting: Family Medicine

## 2023-04-25 DIAGNOSIS — E1169 Type 2 diabetes mellitus with other specified complication: Secondary | ICD-10-CM

## 2023-04-25 DIAGNOSIS — F419 Anxiety disorder, unspecified: Secondary | ICD-10-CM

## 2023-04-25 DIAGNOSIS — F1320 Sedative, hypnotic or anxiolytic dependence, uncomplicated: Secondary | ICD-10-CM

## 2023-04-25 DIAGNOSIS — M47812 Spondylosis without myelopathy or radiculopathy, cervical region: Secondary | ICD-10-CM

## 2023-05-20 ENCOUNTER — Ambulatory Visit: Payer: Medicare Other | Admitting: Family Medicine

## 2023-05-21 ENCOUNTER — Encounter: Payer: Self-pay | Admitting: Family Medicine

## 2023-05-27 ENCOUNTER — Other Ambulatory Visit: Payer: Self-pay | Admitting: Family Medicine

## 2023-05-27 DIAGNOSIS — M47812 Spondylosis without myelopathy or radiculopathy, cervical region: Secondary | ICD-10-CM

## 2023-05-27 DIAGNOSIS — F419 Anxiety disorder, unspecified: Secondary | ICD-10-CM

## 2023-05-27 DIAGNOSIS — F1320 Sedative, hypnotic or anxiolytic dependence, uncomplicated: Secondary | ICD-10-CM

## 2023-05-27 DIAGNOSIS — F32A Depression, unspecified: Secondary | ICD-10-CM

## 2023-05-30 LAB — HM MAMMOGRAPHY

## 2023-06-05 ENCOUNTER — Ambulatory Visit: Payer: Medicare Other | Admitting: Family Medicine

## 2023-06-05 ENCOUNTER — Encounter: Payer: Self-pay | Admitting: Family Medicine

## 2023-06-05 VITALS — BP 111/77 | HR 91 | Ht 66.0 in | Wt 168.0 lb

## 2023-06-05 DIAGNOSIS — M47812 Spondylosis without myelopathy or radiculopathy, cervical region: Secondary | ICD-10-CM | POA: Diagnosis not present

## 2023-06-05 DIAGNOSIS — E039 Hypothyroidism, unspecified: Secondary | ICD-10-CM

## 2023-06-05 DIAGNOSIS — F1320 Sedative, hypnotic or anxiolytic dependence, uncomplicated: Secondary | ICD-10-CM

## 2023-06-05 DIAGNOSIS — Z7984 Long term (current) use of oral hypoglycemic drugs: Secondary | ICD-10-CM

## 2023-06-05 DIAGNOSIS — E1169 Type 2 diabetes mellitus with other specified complication: Secondary | ICD-10-CM | POA: Diagnosis not present

## 2023-06-05 DIAGNOSIS — F419 Anxiety disorder, unspecified: Secondary | ICD-10-CM

## 2023-06-05 DIAGNOSIS — F32A Depression, unspecified: Secondary | ICD-10-CM

## 2023-06-05 LAB — BAYER DCA HB A1C WAIVED: HB A1C (BAYER DCA - WAIVED): 6.4 % — ABNORMAL HIGH (ref 4.8–5.6)

## 2023-06-05 MED ORDER — DULOXETINE HCL 60 MG PO CPEP: 60.0000 mg | ORAL_CAPSULE | Freq: Every day | ORAL | 3 refills | Status: DC

## 2023-06-05 MED ORDER — METFORMIN HCL 1000 MG PO TABS: 1000.0000 mg | ORAL_TABLET | Freq: Two times a day (BID) | ORAL | 3 refills | Status: DC

## 2023-06-05 MED ORDER — OLANZAPINE 15 MG PO TABS: 15.0000 mg | ORAL_TABLET | Freq: Every day | ORAL | 3 refills | Status: DC

## 2023-06-05 MED ORDER — TIZANIDINE HCL 4 MG PO TABS: 4.0000 mg | ORAL_TABLET | Freq: Three times a day (TID) | ORAL | 3 refills | Status: DC

## 2023-06-05 NOTE — Addendum Note (Signed)
Addended by: Arville Care on: 06/05/2023 02:44 PM   Modules accepted: Orders

## 2023-06-05 NOTE — Progress Notes (Signed)
BP 111/77   Pulse 91   Ht 5\' 6"  (1.676 m)   Wt 168 lb (76.2 kg)   LMP 08/28/2016 (Approximate) Comment: Pt reports being unknown when she had her last period, but had spotting two days ago.   SpO2 99%   BMI 27.12 kg/m    Subjective:   Patient ID: Wendy Collier, female    DOB: 1965-07-10, 57 y.o.   MRN: [id number]  HPI: Wendy Collier is a 57 y.o. female presenting on 06/05/2023 for Diabetes, Hypertension, Hyperlipidemia, and Hypothyroidism   HPI Type 2 diabetes mellitus Patient comes in today for recheck of his diabetes. Patient has been currently taking metformin. Patient is currently on an ACE inhibitor/ARB. Patient has seen an ophthalmologist this year. Patient denies any new issues with their feet. The symptom started onset as an adult hypertension and hypothyroidism ARE RELATED TO DM   Hypertension Patient is currently on benazepril and hydrochlorothiazide and metoprolol, and their blood pressure today is 111/77. Patient denies any lightheadedness or dizziness. Patient denies headaches, blurred vision, chest pains, shortness of breath, or weakness. Denies any side effects from medication and is content with current medication.   Hypothyroidism recheck Patient is coming in for thyroid recheck today as well. They deny any issues with hair changes or heat or cold problems or diarrhea or constipation. They deny any chest pain or palpitations. They are currently on levothyroxine 75 micrograms   Depression recheck Patient is coming in today for anxiety depression recheck and currently she takes duloxetine and Zyprexa.  She feels like she is doing pretty well on the medications working pretty well seems pretty happy and content.  Denies any suicidal ideations or thoughts of hurting herself today.    06/05/2023    2:09 PM 03/21/2023   11:10 AM 02/28/2023   10:54 AM 02/15/2023    3:05 PM 11/15/2022    2:03 PM  Depression screen PHQ 2/9  Decreased Interest 2 2 0 3 3  Down, Depressed,  Hopeless 1 1 0 1 1  PHQ - 2 Score 3 3 0 4 4  Altered sleeping 1 3 0 2 1  Tired, decreased energy 3 3 0 3 3  Change in appetite 2 1 0 2 2  Feeling bad or failure about yourself  1 0 0 2 0  Trouble concentrating 1 2 0 1 0  Moving slowly or fidgety/restless 0 0 0 0 0  Suicidal thoughts 0 0 0 0 0  PHQ-9 Score 11 12 0 14 10  Difficult doing work/chores Not difficult at all Extremely dIfficult Not difficult at all Extremely dIfficult Not difficult at all     Relevant past medical, surgical, family and social history reviewed and updated as indicated. Interim medical history since our last visit reviewed. Allergies and medications reviewed and updated.  Review of Systems  Constitutional:  Negative for chills and fever.  HENT:  Negative for congestion, ear discharge and ear pain.   Eyes:  Negative for redness and visual disturbance.  Respiratory:  Negative for chest tightness and shortness of breath.   Cardiovascular:  Negative for chest pain and leg swelling.  Genitourinary:  Negative for difficulty urinating and dysuria.  Musculoskeletal:  Negative for back pain and gait problem.  Skin:  Negative for rash.  Neurological:  Negative for dizziness, light-headedness and headaches.  Psychiatric/Behavioral:  Negative for agitation and behavioral problems.   All other systems reviewed and are negative.   Per HPI unless specifically indicated above   Allergies  as of 06/05/2023       Reactions   Silver Rash   Tegaderm Tegaderm Tegaderm Tegaderm Tegaderm Tegaderm        Medication List        Accurate as of June 05, 2023  2:29 PM. If you have any questions, ask your nurse or doctor.          benazepril 10 MG tablet Commonly known as: LOTENSIN Take 3 tablets (30 mg total) by mouth daily.   DULoxetine 60 MG capsule Commonly known as: CYMBALTA Take 1 capsule (60 mg total) by mouth daily.   fenofibrate 145 MG tablet Commonly known as: TRICOR Take 1 tablet (145 mg  total) by mouth daily.   hydrochlorothiazide 25 MG tablet Commonly known as: HYDRODIURIL Take 1 tablet (25 mg total) by mouth daily.   levothyroxine 75 MCG tablet Commonly known as: SYNTHROID Take 1 tablet (75 mcg total) by mouth daily before breakfast.   metFORMIN 1000 MG tablet Commonly known as: GLUCOPHAGE Take 1 tablet (1,000 mg total) by mouth 2 (two) times daily with a meal.   metoprolol succinate 25 MG 24 hr tablet Commonly known as: TOPROL-XL Take 1 tablet (25 mg total) by mouth daily.   multivitamin with minerals tablet Take 1 tablet by mouth daily.   OLANZapine 15 MG tablet Commonly known as: ZYPREXA Take 1 tablet (15 mg total) by mouth at bedtime.   rosuvastatin 10 MG tablet Commonly known as: CRESTOR Take 1 tablet (10 mg total) by mouth daily.   Senna S 8.6-50 MG tablet Generic drug: senna-docusate TAKE (2) TABLETS DAILY AT BEDTIME.   tiZANidine 4 MG tablet Commonly known as: ZANAFLEX Take 1 tablet (4 mg total) by mouth 3 (three) times daily.         Objective:   BP 111/77   Pulse 91   Ht 5\' 6"  (1.676 m)   Wt 168 lb (76.2 kg)   LMP 08/28/2016 (Approximate) Comment: Pt reports being unknown when she had her last period, but had spotting two days ago.   SpO2 99%   BMI 27.12 kg/m   Wt Readings from Last 3 Encounters:  06/05/23 168 lb (76.2 kg)  03/21/23 169 lb (76.7 kg)  02/28/23 168 lb (76.2 kg)    Physical Exam Vitals and nursing note reviewed.  Constitutional:      General: She is not in acute distress.    Appearance: She is well-developed. She is not diaphoretic.  Eyes:     Conjunctiva/sclera: Conjunctivae normal.  Cardiovascular:     Rate and Rhythm: Normal rate and regular rhythm.     Heart sounds: Normal heart sounds. No murmur heard. Pulmonary:     Effort: Pulmonary effort is normal. No respiratory distress.     Breath sounds: Normal breath sounds. No wheezing.  Musculoskeletal:        General: No swelling or tenderness. Normal  range of motion.  Skin:    General: Skin is warm and dry.     Findings: No rash.  Neurological:     Mental Status: She is alert and oriented to person, place, and time.     Coordination: Coordination normal.  Psychiatric:        Behavior: Behavior normal.     Results for orders placed or performed in visit on 04/02/23  HM DIABETES EYE EXAM  Result Value Ref Range   HM Diabetic Eye Exam No Retinopathy No Retinopathy    Assessment & Plan:   Problem List Items Addressed  This Visit       Endocrine   Hypothyroidism   Type 2 diabetes mellitus with other specified complication (HCC) - Primary   Relevant Medications   metFORMIN (GLUCOPHAGE) 1000 MG tablet   Other Relevant Orders   Bayer DCA Hb A1c Waived   Microalbumin / creatinine urine ratio     Musculoskeletal and Integument   Osteoarthritis of neck   Relevant Medications   tiZANidine (ZANAFLEX) 4 MG tablet     Other   Anxiety and depression   Relevant Medications   OLANZapine (ZYPREXA) 15 MG tablet   tiZANidine (ZANAFLEX) 4 MG tablet   DULoxetine (CYMBALTA) 60 MG capsule   Benzodiazepine dependence (HCC)   Relevant Medications   tiZANidine (ZANAFLEX) 4 MG tablet    A1c looks good at 6.4.  No changes, just focus on diet.  Otherwise she seems to be doing pretty well, no change in medication. Follow up plan: Return in about 3 months (around 09/03/2023), or if symptoms worsen or fail to improve, for Diabetes and anxiety and depression recheck.  Counseling provided for all of the vaccine components Orders Placed This Encounter  Procedures   Bayer DCA Hb A1c Waived   Microalbumin / creatinine urine ratio    Arville Care, MD [facility] 06/05/2023, 2:29 PM

## 2023-06-06 LAB — CMP14+EGFR
ALT: 49 [IU]/L — ABNORMAL HIGH (ref 0–32)
AST: 42 [IU]/L — ABNORMAL HIGH (ref 0–40)
Albumin: 4.9 g/dL (ref 3.8–4.9)
Alkaline Phosphatase: 71 [IU]/L (ref 44–121)
BUN/Creatinine Ratio: 19 (ref 9–23)
BUN: 18 mg/dL (ref 6–24)
Bilirubin Total: 0.2 mg/dL (ref 0.0–1.2)
CO2: 20 mmol/L (ref 20–29)
Calcium: 10.2 mg/dL (ref 8.7–10.2)
Chloride: 104 mmol/L (ref 96–106)
Creatinine, Ser: 0.94 mg/dL (ref 0.57–1.00)
Globulin, Total: 1.9 g/dL (ref 1.5–4.5)
Glucose: 141 mg/dL — ABNORMAL HIGH (ref 70–99)
Potassium: 4.2 mmol/L (ref 3.5–5.2)
Sodium: 143 mmol/L (ref 134–144)
Total Protein: 6.8 g/dL (ref 6.0–8.5)
eGFR: 72 mL/min/{1.73_m2} (ref >59)

## 2023-06-06 LAB — LIPID PANEL
Chol/HDL Ratio: 3.5 {ratio} (ref 0.0–4.4)
Cholesterol, Total: 113 mg/dL (ref 100–199)
HDL: 32 mg/dL — ABNORMAL LOW (ref >39)
LDL Chol Calc (NIH): 41 mg/dL (ref 0–99)
Triglycerides: 255 mg/dL — ABNORMAL HIGH (ref 0–149)
VLDL Cholesterol Cal: 40 mg/dL (ref 5–40)

## 2023-06-06 LAB — TSH: TSH: 1.86 u[IU]/mL (ref 0.450–4.500)

## 2023-06-06 LAB — MICROALBUMIN / CREATININE URINE RATIO
Creatinine, Urine: 96.1 mg/dL
Microalb/Creat Ratio: 5 mg/g{creat} (ref 0–29)
Microalbumin, Urine: 4.5 ug/mL

## 2023-09-04 ENCOUNTER — Ambulatory Visit: Payer: Medicare Other | Admitting: Family Medicine

## 2023-09-04 ENCOUNTER — Encounter: Payer: Self-pay | Admitting: Family Medicine

## 2023-09-04 VITALS — BP 110/78 | HR 104 | Ht 66.0 in | Wt 167.0 lb

## 2023-09-04 DIAGNOSIS — F419 Anxiety disorder, unspecified: Secondary | ICD-10-CM | POA: Diagnosis not present

## 2023-09-04 DIAGNOSIS — Z7984 Long term (current) use of oral hypoglycemic drugs: Secondary | ICD-10-CM

## 2023-09-04 DIAGNOSIS — E039 Hypothyroidism, unspecified: Secondary | ICD-10-CM

## 2023-09-04 DIAGNOSIS — F319 Bipolar disorder, unspecified: Secondary | ICD-10-CM

## 2023-09-04 DIAGNOSIS — E1169 Type 2 diabetes mellitus with other specified complication: Secondary | ICD-10-CM

## 2023-09-04 DIAGNOSIS — I10 Essential (primary) hypertension: Secondary | ICD-10-CM

## 2023-09-04 DIAGNOSIS — E782 Mixed hyperlipidemia: Secondary | ICD-10-CM

## 2023-09-04 DIAGNOSIS — F32A Depression, unspecified: Secondary | ICD-10-CM

## 2023-09-04 DIAGNOSIS — Z23 Encounter for immunization: Secondary | ICD-10-CM

## 2023-09-04 LAB — BAYER DCA HB A1C WAIVED: HB A1C (BAYER DCA - WAIVED): 6.1 % — ABNORMAL HIGH (ref 4.8–5.6)

## 2023-09-04 MED ORDER — LEVOTHYROXINE SODIUM 75 MCG PO TABS: 75.0000 ug | ORAL_TABLET | Freq: Every day | ORAL | 3 refills | Status: DC

## 2023-09-04 MED ORDER — FENOFIBRATE 145 MG PO TABS: 145.0000 mg | ORAL_TABLET | Freq: Every day | ORAL | 3 refills | Status: AC

## 2023-09-04 MED ORDER — METOPROLOL SUCCINATE ER 25 MG PO TB24: 25.0000 mg | ORAL_TABLET | Freq: Every day | ORAL | 3 refills | Status: AC

## 2023-09-04 NOTE — Progress Notes (Signed)
 BP 110/78   Pulse (!) 104   Ht 5\' 6"  (1.676 m)   Wt 167 lb (75.8 kg)   LMP 08/28/2016 (Approximate) Comment: Pt reports being unknown when she had her last period, but had spotting two days ago.   SpO2 94%   BMI 26.95 kg/m    Subjective:   Patient ID: Wendy Collier, female    DOB: Dec 29, 1965, 58 y.o.   MRN: [id number]  HPI: Bowen Goyal is a 58 y.o. female presenting on 09/04/2023 for Medical Management of Chronic Issues, Diabetes, Hyperlipidemia, and Hypertension   HPI Type 2 diabetes mellitus Patient comes in today for recheck of his diabetes. Patient has been currently taking metformin. Patient is currently on an ACE inhibitor/ARB. Patient has not seen an ophthalmologist this year. Patient denies any new issues with their feet. The symptom started onset as an adult hypothyroidism and hypertension and hyperlipidemia ARE RELATED TO DM   Hypothyroidism recheck Patient is coming in for thyroid recheck today as well. They deny any issues with hair changes or heat or cold problems or diarrhea or constipation. They deny any chest pain or palpitations. They are currently on levothyroxine 75 micrograms   Hypertension Patient is currently on benazepril and hydrochlorothiazide and metoprolol, and their blood pressure today is 110/78. Patient denies any lightheadedness or dizziness. Patient denies headaches, blurred vision, chest pains, shortness of breath, or weakness. Denies any side effects from medication and is content with current medication.  She did states she had a few days where she had some palpitations or flutters a couple weeks ago but then it calms down and she is not having any further.  Hyperlipidemia Patient is coming in for recheck of his hyperlipidemia. The patient is currently taking fenofibrate and Crestor. They deny any issues with myalgias or history of liver damage from it. They deny any focal numbness or weakness or chest pain.   Bipolar and anxiety and  depression Patient is coming in today for recheck for bipolar and anxiety depression.  She currently uses duloxetine and Zyprexa.  She feels like she is doing very well on the medication.  She denies any suicidal ideations or thoughts of hurting self and feels like it is still helping control her anxiety very well.  She is taking the Zyprexa and the Cymbalta every day    09/04/2023    2:54 PM 06/05/2023    2:09 PM 03/21/2023   11:10 AM 02/28/2023   10:54 AM 02/15/2023    3:05 PM  Depression screen PHQ 2/9  Decreased Interest 3 2 2  0 3  Down, Depressed, Hopeless 2 1 1  0 1  PHQ - 2 Score 5 3 3  0 4  Altered sleeping 2 1 3  0 2  Tired, decreased energy 3 3 3  0 3  Change in appetite 2 2 1  0 2  Feeling bad or failure about yourself  2 1 0 0 2  Trouble concentrating 2 1 2  0 1  Moving slowly or fidgety/restless 0 0 0 0 0  Suicidal thoughts 0 0 0 0 0  PHQ-9 Score 16 11 12  0 14  Difficult doing work/chores Extremely dIfficult Not difficult at all Extremely dIfficult Not difficult at all Extremely dIfficult     Relevant past medical, surgical, family and social history reviewed and updated as indicated. Interim medical history since our last visit reviewed. Allergies and medications reviewed and updated.  Review of Systems  Constitutional:  Negative for chills and fever.  Eyes:  Negative for  visual disturbance.  Respiratory:  Negative for chest tightness and shortness of breath.   Cardiovascular:  Negative for chest pain and leg swelling.  Genitourinary:  Negative for difficulty urinating and dysuria.  Musculoskeletal:  Negative for back pain and gait problem.  Skin:  Negative for rash.  Neurological:  Negative for dizziness, light-headedness and headaches.  Psychiatric/Behavioral:  Negative for agitation and behavioral problems.   All other systems reviewed and are negative.   Per HPI unless specifically indicated above   Allergies as of 09/04/2023       Reactions   Silver Rash    Tegaderm Tegaderm Tegaderm Tegaderm Tegaderm Tegaderm        Medication List        Accurate as of September 04, 2023  3:25 PM. If you have any questions, ask your nurse or doctor.          benazepril 10 MG tablet Commonly known as: LOTENSIN Take 3 tablets (30 mg total) by mouth daily.   DULoxetine 60 MG capsule Commonly known as: CYMBALTA Take 1 capsule (60 mg total) by mouth daily.   fenofibrate 145 MG tablet Commonly known as: TRICOR Take 1 tablet (145 mg total) by mouth daily.   hydrochlorothiazide 25 MG tablet Commonly known as: HYDRODIURIL Take 1 tablet (25 mg total) by mouth daily.   levothyroxine 75 MCG tablet Commonly known as: SYNTHROID Take 1 tablet (75 mcg total) by mouth daily before breakfast.   metFORMIN 1000 MG tablet Commonly known as: GLUCOPHAGE Take 1 tablet (1,000 mg total) by mouth 2 (two) times daily with a meal.   metoprolol succinate 25 MG 24 hr tablet Commonly known as: TOPROL-XL Take 1 tablet (25 mg total) by mouth daily.   multivitamin with minerals tablet Take 1 tablet by mouth daily.   OLANZapine 15 MG tablet Commonly known as: ZYPREXA Take 1 tablet (15 mg total) by mouth at bedtime.   rosuvastatin 10 MG tablet Commonly known as: CRESTOR Take 1 tablet (10 mg total) by mouth daily.   Senna S 8.6-50 MG tablet Generic drug: senna-docusate TAKE (2) TABLETS DAILY AT BEDTIME.   tiZANidine 4 MG tablet Commonly known as: ZANAFLEX Take 1 tablet (4 mg total) by mouth 3 (three) times daily.         Objective:   BP 110/78   Pulse (!) 104   Ht 5\' 6"  (1.676 m)   Wt 167 lb (75.8 kg)   LMP 08/28/2016 (Approximate) Comment: Pt reports being unknown when she had her last period, but had spotting two days ago.   SpO2 94%   BMI 26.95 kg/m   Wt Readings from Last 3 Encounters:  09/04/23 167 lb (75.8 kg)  06/05/23 168 lb (76.2 kg)  03/21/23 169 lb (76.7 kg)    Physical Exam Vitals and nursing note reviewed.  Constitutional:       General: She is not in acute distress.    Appearance: She is well-developed. She is not diaphoretic.  Eyes:     Conjunctiva/sclera: Conjunctivae normal.  Cardiovascular:     Rate and Rhythm: Normal rate and regular rhythm.     Heart sounds: Normal heart sounds. No murmur heard. Pulmonary:     Effort: Pulmonary effort is normal. No respiratory distress.     Breath sounds: Normal breath sounds. No wheezing.  Musculoskeletal:        General: No tenderness. Normal range of motion.  Skin:    General: Skin is warm and dry.  Findings: No rash.  Neurological:     Mental Status: She is alert and oriented to person, place, and time.     Coordination: Coordination normal.  Psychiatric:        Mood and Affect: Mood is anxious and depressed.        Behavior: Behavior normal.        Thought Content: Thought content does not include suicidal ideation. Thought content does not include suicidal plan.       Assessment & Plan:   Problem List Items Addressed This Visit       Cardiovascular and Mediastinum   Essential hypertension   Relevant Medications   fenofibrate (TRICOR) 145 MG tablet   metoprolol succinate (TOPROL-XL) 25 MG 24 hr tablet     Endocrine   Hypothyroidism   Relevant Medications   levothyroxine (SYNTHROID) 75 MCG tablet   metoprolol succinate (TOPROL-XL) 25 MG 24 hr tablet   Type 2 diabetes mellitus with other specified complication (HCC) - Primary   Relevant Orders   Bayer DCA Hb A1c Waived     Other   Anxiety and depression   Bipolar 1 disorder (HCC)   Hyperlipemia   Relevant Medications   fenofibrate (TRICOR) 145 MG tablet   metoprolol succinate (TOPROL-XL) 25 MG 24 hr tablet    Blood pressure looks good today, continue current medicine.  A1c is: 6.1, looks good, no changes  For the heart palpitations recommended that she keep an eye on it and if it does come back that she would come in to see if we can put a heart monitor on her. Follow up  plan: Return in about 3 months (around 12/05/2023), or if symptoms worsen or fail to improve, for Diabetes recheck and anxiety and hypothyroidism.  Counseling provided for all of the vaccine components Orders Placed This Encounter  Procedures   Bayer DCA Hb A1c Waived    Arville Care, MD [facility] 09/04/2023, 3:25 PM

## 2023-10-14 ENCOUNTER — Other Ambulatory Visit: Payer: Self-pay | Admitting: Family Medicine

## 2023-10-14 DIAGNOSIS — F32A Depression, unspecified: Secondary | ICD-10-CM

## 2023-10-14 DIAGNOSIS — F1320 Sedative, hypnotic or anxiolytic dependence, uncomplicated: Secondary | ICD-10-CM

## 2023-10-14 DIAGNOSIS — M47812 Spondylosis without myelopathy or radiculopathy, cervical region: Secondary | ICD-10-CM

## 2023-11-04 ENCOUNTER — Other Ambulatory Visit: Payer: Self-pay | Admitting: Family Medicine

## 2023-11-04 DIAGNOSIS — F32A Depression, unspecified: Secondary | ICD-10-CM

## 2023-11-04 DIAGNOSIS — M47812 Spondylosis without myelopathy or radiculopathy, cervical region: Secondary | ICD-10-CM

## 2023-11-04 DIAGNOSIS — F1320 Sedative, hypnotic or anxiolytic dependence, uncomplicated: Secondary | ICD-10-CM

## 2023-12-05 ENCOUNTER — Encounter: Payer: Self-pay | Admitting: Family Medicine

## 2023-12-05 ENCOUNTER — Ambulatory Visit: Admitting: Family Medicine

## 2024-01-16 ENCOUNTER — Encounter: Payer: Self-pay | Admitting: Family Medicine

## 2024-01-16 ENCOUNTER — Ambulatory Visit (INDEPENDENT_AMBULATORY_CARE_PROVIDER_SITE_OTHER): Admitting: Family Medicine

## 2024-01-16 VITALS — BP 124/78 | HR 98 | Ht 66.0 in | Wt 165.0 lb

## 2024-01-16 DIAGNOSIS — E1169 Type 2 diabetes mellitus with other specified complication: Secondary | ICD-10-CM

## 2024-01-16 DIAGNOSIS — E039 Hypothyroidism, unspecified: Secondary | ICD-10-CM | POA: Diagnosis not present

## 2024-01-16 DIAGNOSIS — F419 Anxiety disorder, unspecified: Secondary | ICD-10-CM

## 2024-01-16 DIAGNOSIS — E782 Mixed hyperlipidemia: Secondary | ICD-10-CM | POA: Diagnosis not present

## 2024-01-16 DIAGNOSIS — Z23 Encounter for immunization: Secondary | ICD-10-CM

## 2024-01-16 DIAGNOSIS — I10 Essential (primary) hypertension: Secondary | ICD-10-CM

## 2024-01-16 DIAGNOSIS — F32A Depression, unspecified: Secondary | ICD-10-CM

## 2024-01-16 DIAGNOSIS — Z7984 Long term (current) use of oral hypoglycemic drugs: Secondary | ICD-10-CM

## 2024-01-16 LAB — LIPID PANEL

## 2024-01-16 LAB — BAYER DCA HB A1C WAIVED: HB A1C (BAYER DCA - WAIVED): 6.6 % — ABNORMAL HIGH (ref 4.8–5.6)

## 2024-01-16 MED ORDER — BENAZEPRIL HCL 10 MG PO TABS: 30.0000 mg | ORAL_TABLET | Freq: Every day | ORAL | 3 refills | Status: AC

## 2024-01-16 MED ORDER — ROSUVASTATIN CALCIUM 10 MG PO TABS: 10.0000 mg | ORAL_TABLET | Freq: Every day | ORAL | 3 refills | Status: AC

## 2024-01-16 MED ORDER — HYDROCHLOROTHIAZIDE 25 MG PO TABS: 25.0000 mg | ORAL_TABLET | Freq: Every day | ORAL | 3 refills | Status: AC

## 2024-01-16 MED ORDER — BUPROPION HCL ER (XL) 150 MG PO TB24: 150.0000 mg | ORAL_TABLET | Freq: Every day | ORAL | 2 refills | Status: DC

## 2024-01-16 NOTE — Progress Notes (Signed)
 BP 124/78   Pulse 98   Ht 5' 6 (1.676 m)   Wt 165 lb (74.8 kg)   LMP 08/28/2016 (Approximate) Comment: Pt reports being unknown when she had her last period, but had spotting two days ago.   SpO2 96%   BMI 26.63 kg/m    Subjective:   Patient ID: Wendy Collier, female    DOB: Jul 17, 1965, 58 y.o.   MRN: [id number]  HPI: Wendy Collier is a 58 y.o. female presenting on 01/16/2024 for Medical Management of Chronic Issues, Diabetes, Hypothyroidism, Hyperlipidemia, and Hypertension   HPI Type 2 diabetes mellitus Patient comes in today for recheck of his diabetes. Patient has been currently taking metformin . Patient is currently on an ACE inhibitor/ARB. Patient has not seen an ophthalmologist this year. Patient denies any new issues with their feet. The symptom started onset as an adult hypothyroidism and hyperlipidemia ARE RELATED TO DM   Hypertension Patient is currently on hydrocodone  chlorothiazide and benazepril  and metoprolol , and their blood pressure today is 124/78. Patient denies any lightheadedness or dizziness. Patient denies headaches, blurred vision, chest pains, shortness of breath, or weakness. Denies any side effects from medication and is content with current medication.   Hypothyroidism recheck Patient is coming in for thyroid recheck today as well. They deny any issues with hair changes or heat or cold problems or diarrhea or constipation. They deny any chest pain or palpitations. They are currently on levothyroxine  75 micrograms   Hyperlipidemia Patient is coming in for recheck of his hyperlipidemia. The patient is currently taking fenofibrate  and Crestor . They deny any issues with myalgias or history of liver damage from it. They deny any focal numbness or weakness or chest pain.   Relevant past medical, surgical, family and social history reviewed and updated as indicated. Interim medical history since our last visit reviewed. Allergies and medications reviewed and  updated.  Review of Systems  Constitutional:  Negative for chills and fever.  Eyes:  Negative for redness and visual disturbance.  Respiratory:  Negative for chest tightness and shortness of breath.   Cardiovascular:  Negative for chest pain and leg swelling.  Genitourinary:  Negative for difficulty urinating and dysuria.  Musculoskeletal:  Negative for back pain and gait problem.  Skin:  Negative for rash.  Neurological:  Negative for dizziness, light-headedness and headaches.  Psychiatric/Behavioral:  Negative for agitation and behavioral problems.   All other systems reviewed and are negative.   Per HPI unless specifically indicated above   Allergies as of 01/16/2024       Reactions   Silver Rash   Tegaderm Tegaderm Tegaderm Tegaderm Tegaderm Tegaderm        Medication List        Accurate as of January 16, 2024  2:06 PM. If you have any questions, ask your nurse or doctor.          benazepril  10 MG tablet Commonly known as: LOTENSIN  Take 3 tablets (30 mg total) by mouth daily.   buPROPion  150 MG 24 hr tablet Commonly known as: Wellbutrin  XL Take 1 tablet (150 mg total) by mouth daily. Started by: Fonda LABOR Jacorie Ernsberger   DULoxetine  60 MG capsule Commonly known as: CYMBALTA  Take 1 capsule (60 mg total) by mouth daily.   fenofibrate  145 MG tablet Commonly known as: TRICOR  Take 1 tablet (145 mg total) by mouth daily.   hydrochlorothiazide  25 MG tablet Commonly known as: HYDRODIURIL  Take 1 tablet (25 mg total) by mouth daily.   levothyroxine  75  MCG tablet Commonly known as: SYNTHROID  Take 1 tablet (75 mcg total) by mouth daily before breakfast.   metFORMIN  1000 MG tablet Commonly known as: GLUCOPHAGE  Take 1 tablet (1,000 mg total) by mouth 2 (two) times daily with a meal.   metoprolol  succinate 25 MG 24 hr tablet Commonly known as: TOPROL -XL Take 1 tablet (25 mg total) by mouth daily.   multivitamin with minerals tablet Take 1 tablet by mouth  daily.   OLANZapine  15 MG tablet Commonly known as: ZYPREXA  Take 1 tablet (15 mg total) by mouth at bedtime.   rosuvastatin  10 MG tablet Commonly known as: CRESTOR  Take 1 tablet (10 mg total) by mouth daily.   Senna S 8.6-50 MG tablet Generic drug: senna-docusate TAKE (2) TABLETS DAILY AT BEDTIME.   tiZANidine  4 MG tablet Commonly known as: ZANAFLEX  TAKE 1 TABLET BY MOUTH THREE TIMES DAILY         Objective:   BP 124/78   Pulse 98   Ht 5' 6 (1.676 m)   Wt 165 lb (74.8 kg)   LMP 08/28/2016 (Approximate) Comment: Pt reports being unknown when she had her last period, but had spotting two days ago.   SpO2 96%   BMI 26.63 kg/m   Wt Readings from Last 3 Encounters:  01/16/24 165 lb (74.8 kg)  09/04/23 167 lb (75.8 kg)  06/05/23 168 lb (76.2 kg)    Physical Exam Vitals and nursing note reviewed.  Constitutional:      General: She is not in acute distress.    Appearance: She is well-developed. She is not diaphoretic.  Eyes:     Conjunctiva/sclera: Conjunctivae normal.  Cardiovascular:     Rate and Rhythm: Normal rate and regular rhythm.     Heart sounds: Normal heart sounds. No murmur heard. Pulmonary:     Effort: Pulmonary effort is normal. No respiratory distress.     Breath sounds: Normal breath sounds. No wheezing.  Musculoskeletal:        General: No swelling. Normal range of motion.  Skin:    General: Skin is warm and dry.     Findings: No rash.  Neurological:     Mental Status: She is alert and oriented to person, place, and time.     Coordination: Coordination normal.  Psychiatric:        Behavior: Behavior normal.       Assessment & Plan:   Problem List Items Addressed This Visit       Cardiovascular and Mediastinum   Essential hypertension   Relevant Medications   benazepril  (LOTENSIN ) 10 MG tablet   hydrochlorothiazide  (HYDRODIURIL ) 25 MG tablet   rosuvastatin  (CRESTOR ) 10 MG tablet   Other Relevant Orders   CBC with  Differential/Platelet   CMP14+EGFR   Lipid panel     Endocrine   Hypothyroidism   Relevant Orders   TSH   Type 2 diabetes mellitus with other specified complication (HCC) - Primary   Relevant Medications   benazepril  (LOTENSIN ) 10 MG tablet   rosuvastatin  (CRESTOR ) 10 MG tablet   Other Relevant Orders   Bayer DCA Hb A1c Waived     Other   Anxiety and depression   Relevant Medications   buPROPion  (WELLBUTRIN  XL) 150 MG 24 hr tablet   Hyperlipemia   Relevant Medications   benazepril  (LOTENSIN ) 10 MG tablet   hydrochlorothiazide  (HYDRODIURIL ) 25 MG tablet   rosuvastatin  (CRESTOR ) 10 MG tablet   Other Relevant Orders   CBC with Differential/Platelet   CMP14+EGFR  Lipid panel    A1c is 6.6 which is up from 6.1 last time, will have her mainly focus on diet, no medication changes at this point.  Patient says her energy is down and she would like to try Wellbutrin , will have her start the Wellbutrin  for a month and then notify me if it is going well and if it is then we can start tapering off of her Cymbalta  Follow up plan: Return in about 3 months (around 04/17/2024), or if symptoms worsen or fail to improve, for Diabetes and hypothyroidism.  Counseling provided for all of the vaccine components Orders Placed This Encounter  Procedures   Bayer DCA Hb A1c Waived   CBC with Differential/Platelet   CMP14+EGFR   Lipid panel   TSH    Fonda Levins, MD [facility] 01/16/2024, 2:06 PM

## 2024-01-17 LAB — LIPID PANEL
Cholesterol, Total: 92 mg/dL — AB (ref 100–199)
HDL: 32 mg/dL — AB (ref >39)
LDL CALC COMMENT:: 2.9 ratio (ref 0.0–4.4)
LDL Chol Calc (NIH): 28 mg/dL (ref 0–99)
Triglycerides: 200 mg/dL — AB (ref 0–149)
VLDL Cholesterol Cal: 32 mg/dL (ref 5–40)

## 2024-01-17 LAB — CMP14+EGFR
ALT: 50 IU/L — AB (ref 0–32)
AST: 42 IU/L — AB (ref 0–40)
Albumin: 4.8 g/dL (ref 3.8–4.9)
Alkaline Phosphatase: 81 IU/L (ref 44–121)
BUN/Creatinine Ratio: 17 (ref 9–23)
BUN: 18 mg/dL (ref 6–24)
Bilirubin Total: 0.3 mg/dL (ref 0.0–1.2)
CO2: 20 mmol/L (ref 20–29)
Calcium: 10.6 mg/dL — AB (ref 8.7–10.2)
Chloride: 98 mmol/L (ref 96–106)
Creatinine, Ser: 1.05 mg/dL — AB (ref 0.57–1.00)
Globulin, Total: 2.2 g/dL (ref 1.5–4.5)
Glucose: 176 mg/dL — AB (ref 70–99)
Potassium: 3.8 mmol/L (ref 3.5–5.2)
Sodium: 138 mmol/L (ref 134–144)
Total Protein: 7 g/dL (ref 6.0–8.5)
eGFR: 62 mL/min/1.73 (ref >59)

## 2024-01-17 LAB — CBC WITH DIFFERENTIAL/PLATELET
Basophils Absolute: 0 x10E3/uL (ref 0.0–0.2)
Basos: 0 %
EOS (ABSOLUTE): 0.3 x10E3/uL (ref 0.0–0.4)
Eos: 6 %
Hematocrit: 41.9 % (ref 34.0–46.6)
Hemoglobin: 13.7 g/dL (ref 11.1–15.9)
Immature Grans (Abs): 0 x10E3/uL (ref 0.0–0.1)
Immature Granulocytes: 0 %
Lymphocytes Absolute: 2.1 x10E3/uL (ref 0.7–3.1)
Lymphs: 45 %
MCH: 29.2 pg (ref 26.6–33.0)
MCHC: 32.7 g/dL (ref 31.5–35.7)
MCV: 89 fL (ref 79–97)
Monocytes Absolute: 0.4 x10E3/uL (ref 0.1–0.9)
Monocytes: 8 %
Neutrophils Absolute: 1.9 x10E3/uL (ref 1.4–7.0)
Neutrophils: 41 %
Platelets: 258 x10E3/uL (ref 150–450)
RBC: 4.69 x10E6/uL (ref 3.77–5.28)
RDW: 12.6 % (ref 11.7–15.4)
WBC: 4.8 x10E3/uL (ref 3.4–10.8)

## 2024-01-17 LAB — TSH: TSH: 5.78 u[IU]/mL — AB (ref 0.450–4.500)

## 2024-01-24 ENCOUNTER — Encounter: Payer: Self-pay | Admitting: Family Medicine

## 2024-01-24 ENCOUNTER — Ambulatory Visit: Payer: Self-pay | Admitting: Family Medicine

## 2024-01-25 ENCOUNTER — Other Ambulatory Visit: Payer: Self-pay | Admitting: Family Medicine

## 2024-01-25 DIAGNOSIS — M47812 Spondylosis without myelopathy or radiculopathy, cervical region: Secondary | ICD-10-CM

## 2024-01-25 DIAGNOSIS — F32A Depression, unspecified: Secondary | ICD-10-CM

## 2024-01-25 DIAGNOSIS — F1320 Sedative, hypnotic or anxiolytic dependence, uncomplicated: Secondary | ICD-10-CM

## 2024-01-27 ENCOUNTER — Other Ambulatory Visit: Payer: Self-pay

## 2024-01-27 MED ORDER — HYDROXYZINE HCL 10 MG PO TABS: 10.0000 mg | ORAL_TABLET | Freq: Three times a day (TID) | ORAL | 2 refills | Status: DC | PRN

## 2024-01-27 MED ORDER — LEVOTHYROXINE SODIUM 88 MCG PO TABS: 88.0000 ug | ORAL_TABLET | Freq: Every day | ORAL | 1 refills | Status: DC

## 2024-02-09 ENCOUNTER — Encounter: Payer: Self-pay | Admitting: Family Medicine

## 2024-02-12 ENCOUNTER — Ambulatory Visit: Admitting: Family Medicine

## 2024-02-27 ENCOUNTER — Ambulatory Visit: Admitting: Family Medicine

## 2024-04-17 ENCOUNTER — Ambulatory Visit (INDEPENDENT_AMBULATORY_CARE_PROVIDER_SITE_OTHER): Admitting: Family Medicine

## 2024-04-17 ENCOUNTER — Encounter: Payer: Self-pay | Admitting: Family Medicine

## 2024-04-17 VITALS — BP 109/76 | HR 90 | Ht 66.0 in | Wt 161.0 lb

## 2024-04-17 DIAGNOSIS — Z7984 Long term (current) use of oral hypoglycemic drugs: Secondary | ICD-10-CM

## 2024-04-17 DIAGNOSIS — E039 Hypothyroidism, unspecified: Secondary | ICD-10-CM

## 2024-04-17 DIAGNOSIS — E782 Mixed hyperlipidemia: Secondary | ICD-10-CM

## 2024-04-17 DIAGNOSIS — F419 Anxiety disorder, unspecified: Secondary | ICD-10-CM

## 2024-04-17 DIAGNOSIS — F32A Depression, unspecified: Secondary | ICD-10-CM

## 2024-04-17 DIAGNOSIS — I152 Hypertension secondary to endocrine disorders: Secondary | ICD-10-CM

## 2024-04-17 DIAGNOSIS — Z23 Encounter for immunization: Secondary | ICD-10-CM

## 2024-04-17 DIAGNOSIS — E1159 Type 2 diabetes mellitus with other circulatory complications: Secondary | ICD-10-CM

## 2024-04-17 DIAGNOSIS — E1169 Type 2 diabetes mellitus with other specified complication: Secondary | ICD-10-CM | POA: Diagnosis not present

## 2024-04-17 LAB — BAYER DCA HB A1C WAIVED: HB A1C (BAYER DCA - WAIVED): 5.9 % — ABNORMAL HIGH (ref 4.8–5.6)

## 2024-04-17 MED ORDER — BUPROPION HCL ER (XL) 150 MG PO TB24: 150.0000 mg | ORAL_TABLET | Freq: Every day | ORAL | 3 refills | Status: AC

## 2024-04-17 MED ORDER — DULOXETINE HCL 60 MG PO CPEP: 60.0000 mg | ORAL_CAPSULE | Freq: Every day | ORAL | 3 refills | Status: AC

## 2024-04-17 MED ORDER — OLANZAPINE 15 MG PO TABS: 15.0000 mg | ORAL_TABLET | Freq: Every day | ORAL | 3 refills | Status: AC

## 2024-04-17 MED ORDER — METFORMIN HCL 1000 MG PO TABS: 1000.0000 mg | ORAL_TABLET | Freq: Two times a day (BID) | ORAL | 3 refills | Status: AC

## 2024-04-17 NOTE — Progress Notes (Signed)
 BP 109/76   Pulse 90   Ht 5' 6 (1.676 m)   Wt 161 lb (73 kg)   LMP 08/28/2016 (Approximate) Comment: Pt reports being unknown when she had her last period, but had spotting two days ago.   SpO2 96%   BMI 25.99 kg/m    Subjective:   Patient ID: Wendy Collier, female    DOB: 1965/08/16, 58 y.o.   MRN: [id number]  HPI: Wendy Collier is a 58 y.o. female presenting on 04/17/2024 for Medical Management of Chronic Issues, Diabetes, and Hypothyroidism   Discussed the use of AI scribe software for clinical note transcription with the patient, who gave verbal consent to proceed.  History of Present Illness   Wendy Collier is a 58 year old female who presents for a recheck and flu shot.  Hypertension - Home blood pressure readings are consistent with today's measurement of 109/76 mmHg - Continues benazepril , hydrochlorothiazide , and metoprolol  - No side effects such as lightheadedness or dizziness  Type 2 diabetes mellitus - Continues metformin  for glycemic control - Monitors morning blood glucose, which ranges from 120 to 160 mg/dL - Last hemoglobin J8r was 6.6%  Hyperlipidemia - Continues Crestor  and fenofibrate  - No adverse effects from lipid-lowering medications  Mood and anxiety disorders - Takes Cymbalta  during the day, Wellbutrin  in the morning, and Zyprexa  at night for anxiety and bipolar disorder - Stable on current psychiatric medications - Previously used hydroxyzine  as needed but discontinued due to lack of efficacy  Thyroid dysfunction - Thyroid levels were slightly abnormal at last visit - No symptoms or changes noted with recent dose adjustment          Relevant past medical, surgical, family and social history reviewed and updated as indicated. Interim medical history since our last visit reviewed. Allergies and medications reviewed and updated.  Review of Systems  Constitutional:  Negative for chills and fever.  Eyes:  Negative for visual disturbance.   Respiratory:  Negative for chest tightness and shortness of breath.   Cardiovascular:  Negative for chest pain and leg swelling.  Genitourinary:  Negative for difficulty urinating and dysuria.  Skin:  Negative for rash.  Neurological:  Negative for dizziness, light-headedness and headaches.  Psychiatric/Behavioral:  Negative for agitation and behavioral problems.   All other systems reviewed and are negative.   Per HPI unless specifically indicated above   Allergies as of 04/17/2024       Reactions   Silver Rash   Tegaderm Tegaderm Tegaderm Tegaderm Tegaderm Tegaderm        Medication List        Accurate as of April 17, 2024  1:54 PM. If you have any questions, ask your nurse or doctor.          STOP taking these medications    hydrOXYzine  10 MG tablet Commonly known as: ATARAX  Stopped by: Fonda LABOR Evanna Washinton       TAKE these medications    benazepril  10 MG tablet Commonly known as: LOTENSIN  Take 3 tablets (30 mg total) by mouth daily.   buPROPion  150 MG 24 hr tablet Commonly known as: Wellbutrin  XL Take 1 tablet (150 mg total) by mouth daily.   DULoxetine  60 MG capsule Commonly known as: CYMBALTA  Take 1 capsule (60 mg total) by mouth daily.   fenofibrate  145 MG tablet Commonly known as: TRICOR  Take 1 tablet (145 mg total) by mouth daily.   hydrochlorothiazide  25 MG tablet Commonly known as: HYDRODIURIL  Take 1 tablet (25 mg total)  by mouth daily.   levothyroxine  88 MCG tablet Commonly known as: SYNTHROID  Take 1 tablet (88 mcg total) by mouth daily.   metFORMIN  1000 MG tablet Commonly known as: GLUCOPHAGE  Take 1 tablet (1,000 mg total) by mouth 2 (two) times daily with a meal.   metoprolol  succinate 25 MG 24 hr tablet Commonly known as: TOPROL -XL Take 1 tablet (25 mg total) by mouth daily.   multivitamin with minerals tablet Take 1 tablet by mouth daily.   OLANZapine  15 MG tablet Commonly known as: ZYPREXA  Take 1 tablet (15 mg  total) by mouth at bedtime.   rosuvastatin  10 MG tablet Commonly known as: CRESTOR  Take 1 tablet (10 mg total) by mouth daily.   Senna S 8.6-50 MG tablet Generic drug: senna-docusate TAKE (2) TABLETS DAILY AT BEDTIME.   tiZANidine  4 MG tablet Commonly known as: ZANAFLEX  TAKE 1 TABLET BY MOUTH THREE TIMES DAILY         Objective:   BP 109/76   Pulse 90   Ht 5' 6 (1.676 m)   Wt 161 lb (73 kg)   LMP 08/28/2016 (Approximate) Comment: Pt reports being unknown when she had her last period, but had spotting two days ago.   SpO2 96%   BMI 25.99 kg/m   Wt Readings from Last 3 Encounters:  04/17/24 161 lb (73 kg)  01/16/24 165 lb (74.8 kg)  09/04/23 167 lb (75.8 kg)    Physical Exam Physical Exam   VITALS: BP- 109/76 NECK: Thyroid normal, no masses. CHEST: Lungs clear to auscultation bilaterally. CARDIOVASCULAR: Heart regular rate and rhythm. EXTREMITIES: No edema, normal pulses in extremities.         Assessment & Plan:   Problem List Items Addressed This Visit       Cardiovascular and Mediastinum   Hypertension associated with diabetes (HCC)   Relevant Medications   metFORMIN  (GLUCOPHAGE ) 1000 MG tablet     Endocrine   Hypothyroidism   Relevant Orders   TSH   Type 2 diabetes mellitus with other specified complication (HCC) - Primary   Relevant Medications   metFORMIN  (GLUCOPHAGE ) 1000 MG tablet   Other Relevant Orders   Bayer DCA Hb A1c Waived   Vitamin B12     Other   Anxiety and depression   Relevant Medications   buPROPion  (WELLBUTRIN  XL) 150 MG 24 hr tablet   DULoxetine  (CYMBALTA ) 60 MG capsule   OLANZapine  (ZYPREXA ) 15 MG tablet   Hyperlipemia       Adult Wellness Visit Routine wellness visit with well-controlled blood pressure and normal findings. - Administer flu shot.  Type 2 diabetes mellitus Blood sugar well-controlled with metformin . A1c pending. - Check A1c level. - Continue metformin .  Mixed hyperlipidemia Cholesterol managed  with Crestor  and fenofibrate . - Continue Crestor . - Continue fenofibrate .  Hypertension Hypertension well-controlled with current medications. - Continue benazepril . - Continue hydrochlorothiazide . - Continue metoprolol .  Hypothyroidism Thyroid function slightly off previously. Current results pending. - Await thyroid function test results.  Anxiety Anxiety and bipolar symptoms stable with current medications. Hydroxyzine  discontinued. - Continue Cymbalta . - Continue Wellbutrin . - Continue Zyprexa .          Follow up plan: Return in about 3 months (around 07/18/2024), or if symptoms worsen or fail to improve, for Diabetes recheck.  Counseling provided for all of the vaccine components Orders Placed This Encounter  Procedures   Bayer DCA Hb A1c Waived   TSH   Vitamin B12    Fonda Levins, MD [facility] 04/17/2024, 1:54 PM

## 2024-04-18 LAB — VITAMIN B12: Vitamin B-12: 763 pg/mL (ref 232–1245)

## 2024-04-18 LAB — TSH: TSH: 2.33 u[IU]/mL (ref 0.450–4.500)

## 2024-04-27 ENCOUNTER — Ambulatory Visit: Payer: Self-pay | Admitting: Family Medicine

## 2024-04-30 ENCOUNTER — Other Ambulatory Visit: Payer: Self-pay | Admitting: *Deleted

## 2024-04-30 ENCOUNTER — Ambulatory Visit

## 2024-04-30 DIAGNOSIS — F32A Depression, unspecified: Secondary | ICD-10-CM

## 2024-04-30 DIAGNOSIS — M47812 Spondylosis without myelopathy or radiculopathy, cervical region: Secondary | ICD-10-CM

## 2024-04-30 DIAGNOSIS — F1320 Sedative, hypnotic or anxiolytic dependence, uncomplicated: Secondary | ICD-10-CM

## 2024-05-27 ENCOUNTER — Other Ambulatory Visit: Payer: Self-pay | Admitting: Family Medicine

## 2024-05-27 DIAGNOSIS — F419 Anxiety disorder, unspecified: Secondary | ICD-10-CM

## 2024-05-27 DIAGNOSIS — M47812 Spondylosis without myelopathy or radiculopathy, cervical region: Secondary | ICD-10-CM

## 2024-05-27 DIAGNOSIS — F1320 Sedative, hypnotic or anxiolytic dependence, uncomplicated: Secondary | ICD-10-CM

## 2024-07-01 ENCOUNTER — Ambulatory Visit: Payer: Self-pay

## 2024-07-15 ENCOUNTER — Other Ambulatory Visit: Payer: Self-pay | Admitting: Family Medicine

## 2024-07-22 ENCOUNTER — Ambulatory Visit: Payer: Self-pay | Admitting: Family Medicine
